# Patient Record
Sex: Male | Born: 1960 | Race: White | Hispanic: No | Marital: Single | Smoking: Current every day smoker
Health system: Southern US, Community
[De-identification: ages and names within clinical notes are randomized; demographics above are authoritative.]

## PROBLEM LIST (undated history)

## (undated) DIAGNOSIS — Z1211 Encounter for screening for malignant neoplasm of colon: Secondary | ICD-10-CM

## (undated) DIAGNOSIS — M5417 Radiculopathy, lumbosacral region: Secondary | ICD-10-CM

## (undated) DIAGNOSIS — I10 Essential (primary) hypertension: Secondary | ICD-10-CM

---

## 2016-04-07 ENCOUNTER — Emergency Department (HOSPITAL_COMMUNITY)
Admission: EM | Admit: 2016-04-07 | Discharge: 2016-04-08 | Disposition: A | Discharge status: Home / Self Care | Payer: BLUE CROSS/BLUE SHIELD | Attending: Emergency Medicine | Admitting: Emergency Medicine

## 2016-04-07 ENCOUNTER — Emergency Department (HOSPITAL_COMMUNITY): Payer: BLUE CROSS/BLUE SHIELD

## 2016-04-07 ENCOUNTER — Encounter (HOSPITAL_COMMUNITY): Payer: Self-pay

## 2016-04-07 DIAGNOSIS — R9431 Abnormal electrocardiogram [ECG] [EKG]: Secondary | ICD-10-CM | POA: Diagnosis not present

## 2016-04-07 DIAGNOSIS — R5383 Other fatigue: Secondary | ICD-10-CM | POA: Insufficient documentation

## 2016-04-07 DIAGNOSIS — R42 Dizziness and giddiness: Secondary | ICD-10-CM | POA: Insufficient documentation

## 2016-04-07 DIAGNOSIS — R079 Chest pain, unspecified: Secondary | ICD-10-CM | POA: Insufficient documentation

## 2016-04-07 DIAGNOSIS — F17210 Nicotine dependence, cigarettes, uncomplicated: Secondary | ICD-10-CM | POA: Diagnosis not present

## 2016-04-07 LAB — CBC WITH DIFFERENTIAL/PLATELET
BASOS PCT: 1 %
Basophils Absolute: 0 10*3/uL (ref 0.0–0.1)
EOS ABS: 0.1 10*3/uL (ref 0.0–0.7)
EOS PCT: 1 %
HCT: 37.9 % — ABNORMAL LOW (ref 39.0–52.0)
Hemoglobin: 12.6 g/dL — ABNORMAL LOW (ref 13.0–17.0)
LYMPHS ABS: 1.5 10*3/uL (ref 0.7–4.0)
Lymphocytes Relative: 18 %
MCH: 29.2 pg (ref 26.0–34.0)
MCHC: 33.2 g/dL (ref 30.0–36.0)
MCV: 87.7 fL (ref 78.0–100.0)
MONOS PCT: 8 %
Monocytes Absolute: 0.7 10*3/uL (ref 0.1–1.0)
Neutro Abs: 6.2 10*3/uL (ref 1.7–7.7)
Neutrophils Relative %: 72 %
PLATELETS: 229 10*3/uL (ref 150–400)
RBC: 4.32 MIL/uL (ref 4.22–5.81)
RDW: 12.9 % (ref 11.5–15.5)
WBC: 8.6 10*3/uL (ref 4.0–10.5)

## 2016-04-07 LAB — I-STAT CHEM 8, ED
BUN: 13 mg/dL (ref 6–20)
CALCIUM ION: 1.03 mmol/L — AB (ref 1.12–1.23)
Chloride: 103 mmol/L (ref 101–111)
Creatinine, Ser: 0.8 mg/dL (ref 0.61–1.24)
Glucose, Bld: 97 mg/dL (ref 65–99)
HEMATOCRIT: 39 % (ref 39.0–52.0)
HEMOGLOBIN: 13.3 g/dL (ref 13.0–17.0)
Potassium: 3.5 mmol/L (ref 3.5–5.1)
SODIUM: 141 mmol/L (ref 135–145)
TCO2: 23 mmol/L (ref 0–100)

## 2016-04-07 LAB — TROPONIN I

## 2016-04-07 NOTE — ED Provider Notes (Addendum)
CSN: [id number]     Arrival date & time 04/07/16  2039 History   First MD Initiated Contact with Patient 04/07/16 2043     Chief Complaint  Patient presents with  . Chest Pain     (Consider location/radiation/quality/duration/timing/severity/associated sxs/prior Treatment) HPI Comments: This is a thin 55 year old male who states at work.  Tonight he developed sharp midsternal chest pain that lasted approximately 45 minutes, he denies nausea, shortness of breath but does state he feels slightly lightheaded.  EMS was called.  He did give him 3 sublingual nitros and 4 baby aspirin.  He does have relief of his discomfort, but still has a small amount on arrival to the emergency department.  EMS stated that he was very hypertensive on their arrival and he was pale, not diaphoretic or short of breath. He states he does not like to go to doctors and he has not been in a number of years.  He's had this kind of pain before but never had it checked out as it never lasted this long.  Usually 10-15 minutes.  It is not associated with nausea, shortness of breath, diaphoresis.  Patient is a 55 y.o. male presenting with chest pain. The history is provided by the patient.  Chest Pain Pain location:  Substernal area Pain quality: sharp   Pain radiates to:  Does not radiate Pain radiates to the back: no   Pain severity:  Severe Onset quality:  Sudden Duration:  45 minutes Timing:  Constant Progression:  Improving Chronicity:  Recurrent Relieved by:  Nitroglycerin Worsened by:  Nothing tried Associated symptoms: fatigue   Associated symptoms: no abdominal pain, no altered mental status, no anorexia, no anxiety, no back pain, no claudication, no cough, no diaphoresis, no fever, no headache, no heartburn, no lower extremity edema, no nausea, no near-syncope, no numbness, no palpitations and no shortness of breath   Risk factors: male sex and smoking   Risk factors: no aortic disease, no coronary artery  disease, no diabetes mellitus, no high cholesterol, no hypertension and not obese     No past medical history on file. No past surgical history on file. No family history on file. Social History  Substance Use Topics  . Smoking status: Current Every Day Smoker -- 0.50 packs/day    Types: Cigarettes  . Smokeless tobacco: Not on file  . Alcohol Use: No    Review of Systems  Constitutional: Positive for fatigue. Negative for fever and diaphoresis.  Respiratory: Negative for cough and shortness of breath.   Cardiovascular: Positive for chest pain. Negative for palpitations, claudication and near-syncope.  Gastrointestinal: Negative for heartburn, nausea, abdominal pain and anorexia.  Musculoskeletal: Negative for back pain.  Neurological: Negative for numbness and headaches.      Allergies  Review of patient's allergies indicates no known allergies.  Home Medications   Prior to Admission medications   Medication Sig Start Date End Date Taking? Authorizing Provider  aspirin-acetaminophen-caffeine (EXCEDRIN MIGRAINE) [phone] MG tablet Take 2 tablets by mouth every 6 (six) hours as needed for headache.   Yes Historical Provider, MD  naproxen sodium (ANAPROX) 220 MG tablet Take 220 mg by mouth 2 (two) times daily as needed (pain).   Yes Historical Provider, MD  RaNITidine HCl (ACID REDUCER PO) Take 1 tablet by mouth daily as needed (acid reflux).   Yes Historical Provider, MD   BP 134/82 mmHg  Pulse 69  Temp(Src) 98.7 F (37.1 C)  Resp 14  SpO2 98% Physical Exam  Constitutional: He appears well-developed and well-nourished.  HENT:  Head: Normocephalic.  Eyes: Pupils are equal, round, and reactive to light.  Neck: Normal range of motion.  Cardiovascular: Normal rate, regular rhythm and normal heart sounds.   Pulmonary/Chest: Effort normal and breath sounds normal. He has no wheezes.  Abdominal: Soft.  Neurological: He is alert.  Skin: Skin is warm.    ED Course   Procedures (including critical care time) Labs Review Labs Reviewed  CBC WITH DIFFERENTIAL/PLATELET - Abnormal; Notable for the following:    Hemoglobin 12.6 (*)    HCT 37.9 (*)    All other components within normal limits  I-STAT CHEM 8, ED - Abnormal; Notable for the following:    Calcium, Ion 1.03 (*)    All other components within normal limits  TROPONIN I  TROPONIN I    Imaging Review Dg Chest 2 View  04/07/2016  CLINICAL DATA:  Chest Pain EXAM: CHEST  2 VIEW COMPARISON:  None. FINDINGS: Pulmonary hyperinflation. Lungs are clear without infiltrate or effusion. Negative for heart failure. Negative for mass or adenopathy. IMPRESSION: Pulmonary hyperinflation.  No acute cardiopulmonary abnormality. Electronically Signed   By: Marlan Palau M.D.   On: 04/07/2016 21:14   I have personally reviewed and evaluated these images and lab results as part of my medical decision-making.   EKG Interpretation   Date/Time:  Monday Apr 07 2016 20:48:57 EDT Ventricular Rate:  75 PR Interval:  150 QRS Duration: 99 QT Interval:  417 QTC Calculation: 466 R Axis:   115 Text Interpretation:  Sinus rhythm Probable left atrial enlargement  Consider right ventricular hypertrophy Nonspecific T abnrm, anterolateral  leads No previous ECGs available Confirmed by [facility] MD, ERIN ([id number])  on 04/07/2016 8:54:09 PM     Patient has had 2 sets of negative cardiac markers, EKG has been unchanged.  Remainder of his labs are normal.  I have discussed at length the importance of follow-up, especially because he was initially hypertensive.  On EMS arrival and with anything to compare to.  This is of concern, especially since his EKG is showing some ventricular hypertrophy MDM   Final diagnoses:  Chest pain, unspecified chest pain type  EKG abnormality         Earley Favor, NP 04/08/16 0145  Alvira Monday, MD 04/09/16 1318  Earley Favor, NP 04/14/16 1914  Alvira Monday, MD 04/20/16  1243

## 2016-04-07 NOTE — ED Notes (Signed)
Patient was at work and began to have sudden sharp chest pain that stays in the mid chest.  Denies any LOC or SOB. 18 gauge places by EMS in the Left AC.  3 nitro given PTA.  Patient also took 4 baby Asprin. Patient A&Ox4

## 2016-04-08 LAB — TROPONIN I: Troponin I: <0.03 ng/mL (ref <0.031)

## 2016-04-08 NOTE — Discharge Instructions (Signed)
Cardiac-Specific Troponin I and T Test WHY AM I HAVING THIS TEST? You may have this test if you have experienced chest pain. The test can be used to determine if you have had a heart attack or injury to heart (cardiac) muscle. This test can also help predict the possibility of future heart attacks. This test measures the concentration of cardiac-specific troponin in your blood. Troponins are proteins that help muscles contract. There are three forms of troponin, including troponins C, I, and T. The types of troponins I and T that are found in cardiac muscle are different from the troponins I and T that are found in skeletal muscle. Therefore, testing can be done for cardiac-specific troponins I and T. These types of troponin are normally present in very small quantities in the blood. When there is damage to heart muscle cells, cardiac troponins I and T are released into circulation. The more damage there is, the greater the concentration of troponins I and T. When a person has a heart attack, levels of troponin can become elevated in the blood within 3-4 hours after injury and may remain elevated for 10-14 days. WHAT KIND OF SAMPLE IS TAKEN? A blood sample is required for this test. It is usually collected by inserting a needle into a vein. Usually, an initial blood sample is collected, and then another blood sample is collected 12 hours later. After these samples, you will have your blood tested daily for 3-5 days. You might also have it tested weekly for 5-6 weeks. HOW DO I PREPARE FOR THE TEST? There is no preparation required for this test. However, be aware that you will need to make arrangements to have your blood collected frequently.  WHAT ARE THE REFERENCE RANGES? Reference values are considered healthy values established after testing a large group of healthy people. Reference values may vary among different people, labs, and hospitals. It is your responsibility to obtain your test results. Ask  the lab or department performing the test when and how you will get your results. Reference values for cardiac troponins are as follows:  Cardiac troponin T: less than 0.1 ng/mL.  Cardiac troponin I: less than 0.03 ng/mL. WHAT DO THE RESULTS MEAN? Troponin values above the reference values may indicate:  Injury to the heart muscle.  Heart attack. Talk with your health care provider to discuss your results, treatment options, and if necessary, the need for more tests. Talk with your health care provider if you have any questions about your results.   This information is not intended to replace advice given to you by your health care provider. Make sure you discuss any questions you have with your health care provider.   Document Released: 11/29/2004 Document Revised: 11/17/2014 Document Reviewed: 03/22/2014 Elsevier Interactive Patient Education 2016 [employer] Tonight your were evaluated for chest pain Your EKG is abnormal with non specific changes  We have no other EKG for comparison You had 2 sets orf cardiac markers 3 hours apart with no change  Which is reassuring BUT will need further evaluation by a cardiologist Please make an appointment

## 2016-04-08 NOTE — ED Notes (Signed)
Patient able to ambulate independently  

## 2016-05-15 ENCOUNTER — Emergency Department (HOSPITAL_COMMUNITY): Payer: BLUE CROSS/BLUE SHIELD

## 2016-05-15 ENCOUNTER — Encounter (HOSPITAL_COMMUNITY): Payer: Self-pay | Admitting: Nurse Practitioner

## 2016-05-15 ENCOUNTER — Emergency Department (HOSPITAL_COMMUNITY)
Admission: EM | Admit: 2016-05-15 | Discharge: 2016-05-15 | Disposition: A | Discharge status: Home / Self Care | Payer: BLUE CROSS/BLUE SHIELD | Attending: Emergency Medicine | Admitting: Emergency Medicine

## 2016-05-15 DIAGNOSIS — Z7982 Long term (current) use of aspirin: Secondary | ICD-10-CM | POA: Insufficient documentation

## 2016-05-15 DIAGNOSIS — F17210 Nicotine dependence, cigarettes, uncomplicated: Secondary | ICD-10-CM | POA: Insufficient documentation

## 2016-05-15 DIAGNOSIS — N132 Hydronephrosis with renal and ureteral calculous obstruction: Secondary | ICD-10-CM | POA: Insufficient documentation

## 2016-05-15 DIAGNOSIS — I10 Essential (primary) hypertension: Secondary | ICD-10-CM | POA: Diagnosis not present

## 2016-05-15 DIAGNOSIS — R1031 Right lower quadrant pain: Secondary | ICD-10-CM

## 2016-05-15 DIAGNOSIS — N23 Unspecified renal colic: Secondary | ICD-10-CM

## 2016-05-15 HISTORY — DX: Essential (primary) hypertension: I10

## 2016-05-15 LAB — COMPREHENSIVE METABOLIC PANEL
ALBUMIN: 4.2 g/dL (ref 3.5–5.0)
ALT: 10 U/L — ABNORMAL LOW (ref 17–63)
ANION GAP: 10 (ref 5–15)
AST: 14 U/L — ABNORMAL LOW (ref 15–41)
Alkaline Phosphatase: 80 U/L (ref 38–126)
BUN: 15 mg/dL (ref 6–20)
CO2: 25 mmol/L (ref 22–32)
Calcium: 9.6 mg/dL (ref 8.9–10.3)
Chloride: 103 mmol/L (ref 101–111)
Creatinine, Ser: 1.32 mg/dL — ABNORMAL HIGH (ref 0.61–1.24)
GFR calc Af Amer: >60 mL/min (ref >60)
GFR calc non Af Amer: >60 mL/min (ref >60)
GLUCOSE: 159 mg/dL — AB (ref 65–99)
POTASSIUM: 3.3 mmol/L — AB (ref 3.5–5.1)
SODIUM: 138 mmol/L (ref 135–145)
TOTAL PROTEIN: 6.9 g/dL (ref 6.5–8.1)
Total Bilirubin: 0.8 mg/dL (ref 0.3–1.2)

## 2016-05-15 LAB — URINALYSIS, ROUTINE W REFLEX MICROSCOPIC
BILIRUBIN URINE: NEGATIVE
Glucose, UA: NEGATIVE mg/dL
Ketones, ur: NEGATIVE mg/dL
Leukocytes, UA: NEGATIVE
NITRITE: NEGATIVE
PH: 7.5 (ref 5.0–8.0)
Protein, ur: NEGATIVE mg/dL
SPECIFIC GRAVITY, URINE: 1.01 (ref 1.005–1.030)

## 2016-05-15 LAB — CBC
HEMATOCRIT: 43.9 % (ref 39.0–52.0)
HEMOGLOBIN: 15 g/dL (ref 13.0–17.0)
MCH: 30.1 pg (ref 26.0–34.0)
MCHC: 34.2 g/dL (ref 30.0–36.0)
MCV: 88 fL (ref 78.0–100.0)
Platelets: 221 10*3/uL (ref 150–400)
RBC: 4.99 MIL/uL (ref 4.22–5.81)
RDW: 13.2 % (ref 11.5–15.5)
WBC: 13.1 10*3/uL — ABNORMAL HIGH (ref 4.0–10.5)

## 2016-05-15 LAB — URINE MICROSCOPIC-ADD ON

## 2016-05-15 MED ORDER — LISINOPRIL 10 MG PO TABS: 10.0000 mg | ORAL_TABLET | Freq: Every day | ORAL | Status: AC

## 2016-05-15 MED ORDER — LISINOPRIL 10 MG PO TABS
10.0000 mg | ORAL_TABLET | Freq: Once | ORAL | Status: AC
  Administered 2016-05-15: 10 mg via ORAL
  Filled 2016-05-15: qty 1

## 2016-05-15 MED ORDER — KETOROLAC TROMETHAMINE 30 MG/ML IJ SOLN
30.0000 mg | Freq: Once | INTRAMUSCULAR | Status: AC
  Administered 2016-05-15: 30 mg via INTRAVENOUS
  Filled 2016-05-15: qty 1

## 2016-05-15 MED ORDER — SODIUM CHLORIDE 0.9 % IV BOLUS (SEPSIS)
1000.0000 mL | Freq: Once | INTRAVENOUS | Status: AC
  Administered 2016-05-15: 1000 mL via INTRAVENOUS

## 2016-05-15 MED ORDER — ONDANSETRON HCL 4 MG/2ML IJ SOLN
4.0000 mg | Freq: Once | INTRAMUSCULAR | Status: AC
  Administered 2016-05-15: 4 mg via INTRAVENOUS
  Filled 2016-05-15: qty 2

## 2016-05-15 MED ORDER — IBUPROFEN 600 MG PO TABS: 600.0000 mg | ORAL_TABLET | Freq: Four times a day (QID) | ORAL | Status: AC | PRN

## 2016-05-15 MED ORDER — MORPHINE SULFATE (PF) 4 MG/ML IV SOLN
4.0000 mg | Freq: Once | INTRAVENOUS | Status: AC
  Administered 2016-05-15: 4 mg via INTRAVENOUS
  Filled 2016-05-15: qty 1

## 2016-05-15 MED ORDER — HYDROCODONE-ACETAMINOPHEN 5-325 MG PO TABS: 1.0000 | ORAL_TABLET | Freq: Four times a day (QID) | ORAL | Status: AC | PRN

## 2016-05-15 MED ORDER — HYDROMORPHONE HCL 1 MG/ML IJ SOLN
1.0000 mg | Freq: Once | INTRAMUSCULAR | Status: AC
  Administered 2016-05-15: 1 mg via INTRAVENOUS
  Filled 2016-05-15: qty 1

## 2016-05-15 MED ORDER — IOPAMIDOL (ISOVUE-300) INJECTION 61%
INTRAVENOUS | Status: AC
  Administered 2016-05-15: 100 mL
  Filled 2016-05-15: qty 100

## 2016-05-15 MED ORDER — TAMSULOSIN HCL 0.4 MG PO CAPS
0.4000 mg | ORAL_CAPSULE | Freq: Every day | ORAL | Status: AC
Start: 2016-05-15 — End: ?

## 2016-05-15 NOTE — Discharge Instructions (Signed)
Take motrin for pain.   Take flomax daily   Take vicodin for severe pain. Do NOT drive with it.   Take lisinopril for elevated blood pressure.   Recheck blood pressure with your doctor or at a pharmacy next week   See urology and primary care doctor for follow up   Return to ER if you have worse abdominal pain, vomiting, fever, unable to urinate

## 2016-05-15 NOTE — ED Provider Notes (Signed)
  Physical Exam  BP 166/98 mmHg  Pulse 78  Temp(Src) 98.5 F (36.9 C) (Oral)  Resp 22  Ht 5\' 9"  (1.753 m)  Wt 115 lb (52.164 kg)  BMI 16.97 kg/m2  SpO2 98%  Physical Exam  ED Course  Procedures  MDM Care assumed at sign out at 4pm. Patient has RLQ pain for 2 days. Has RLQ tenderness. Sign out pending CT ab/pel, concern for possible appendicitis. CT showed no appendicitis, but has R 2x 3 mm R UVJ stone with mild hydro. Pain controlled after pain meds, toradol. UA showed no infection. Cr 1.3, baseline 0.9, likely from kidney stone and dehydration. Was hypertensive 200/100 and has hx of HTN but not on meds. Given lisinopril and BP on discharge is 160/90. Patient has no PCP. Will dc home with motrin, vicodin, flomax, lisinopril. Will have him follow up with [facility] and [facility]rology.   Richardean Canalavid H Kamalei Roeder, MD 05/15/16 [phone]

## 2016-05-15 NOTE — ED Notes (Signed)
He c/o 2 day history of RLQ abd pain radiating across his entire abd. Pain increased with movement. Reports nausea, constipation. Denies n/v, urinary changes, fevers. He has had headaches this week as well, which he took aspirin for with relief at home. He is alert, breathing easily

## 2016-05-15 NOTE — ED Provider Notes (Signed)
CSN: [id number]     Arrival date & time 05/15/16  1150 History   First MD Initiated Contact with Patient 05/15/16 1347     Chief Complaint  Patient presents with  . Abdominal Pain     (Consider location/radiation/quality/duration/timing/severity/associated sxs/prior Treatment) HPI....Marland Kitchen.Marland Kitchen.Right lower quadrant pain for 2 days with associated anorexia. Pain radiates to suprapubic area. Review systems positive for nausea and constipation. No vomiting, fever, sweats, chills. He has a history of hypertension but has not been taking medications. Palpation and positioning make pain worse.  History reviewed. No pertinent past medical history. History reviewed. No pertinent past surgical history. History reviewed. No pertinent family history. Social History  Substance Use Topics  . Smoking status: Current Every Day Smoker -- 0.50 packs/day    Types: Cigarettes  . Smokeless tobacco: None  . Alcohol Use: No    Review of Systems  All other systems reviewed and are negative.     Allergies  Review of patient's allergies indicates no known allergies.  Home Medications   Prior to Admission medications   Medication Sig Start Date End Date Taking? Authorizing Provider  aspirin-acetaminophen-caffeine (EXCEDRIN MIGRAINE) [phone] MG tablet Take 2 tablets by mouth every 6 (six) hours as needed for headache.   Yes Historical Provider, MD  naproxen sodium (ANAPROX) 220 MG tablet Take 220 mg by mouth 2 (two) times daily as needed (pain).   Yes Historical Provider, MD  RaNITidine HCl (ACID REDUCER PO) Take 1 tablet by mouth daily as needed (acid reflux).   Yes Historical Provider, MD   BP 181/102 mmHg  Pulse 101  Temp(Src) 98.5 F (36.9 C) (Oral)  Resp 22  Ht 5\' 9"  (1.753 m)  Wt 115 lb (52.164 kg)  BMI 16.97 kg/m2  SpO2 99% Physical Exam  Constitutional: He is oriented to person, place, and time. He appears well-developed and well-nourished.  HENT:  Head: Normocephalic and atraumatic.   Eyes: Conjunctivae and EOM are normal. Pupils are equal, round, and reactive to light.  Neck: Normal range of motion. Neck supple.  Cardiovascular: Normal rate and regular rhythm.   Pulmonary/Chest: Effort normal and breath sounds normal.  Abdominal: Soft. Bowel sounds are normal.  Tender right lower quadrant  Musculoskeletal: Normal range of motion.  Neurological: He is alert and oriented to person, place, and time.  Skin: Skin is warm and dry.  Psychiatric: He has a normal mood and affect. His behavior is normal.  Nursing note and vitals reviewed.   ED Course  Procedures (including critical care time) Labs Review Labs Reviewed  COMPREHENSIVE METABOLIC PANEL - Abnormal; Notable for the following:    Potassium 3.3 (*)    Glucose, Bld 159 (*)    Creatinine, Ser 1.32 (*)    AST 14 (*)    ALT 10 (*)    All other components within normal limits  CBC - Abnormal; Notable for the following:    WBC 13.1 (*)    All other components within normal limits  URINALYSIS, ROUTINE W REFLEX MICROSCOPIC (NOT AT [facility])    Imaging Review No results found. I have personally reviewed and evaluated these images and lab results as part of my medical decision-making.   EKG Interpretation None      MDM   Final diagnoses:  RLQ abdominal pain    History and physical worrisome for appendicitis. White count elevated at 13 K. CT abdomen pelvis pending. Discussed with Dr. Tresa EndoYao    Shane Bucklew, MD 05/15/16 [phone]

## 2017-06-05 IMAGING — DX DG CHEST 2V
2 series · 2 of 2 positions shown · non-contrast
Comparison: None.

CLINICAL DATA: Chest Pain

EXAM:
CHEST  2 VIEW

[chest lat]
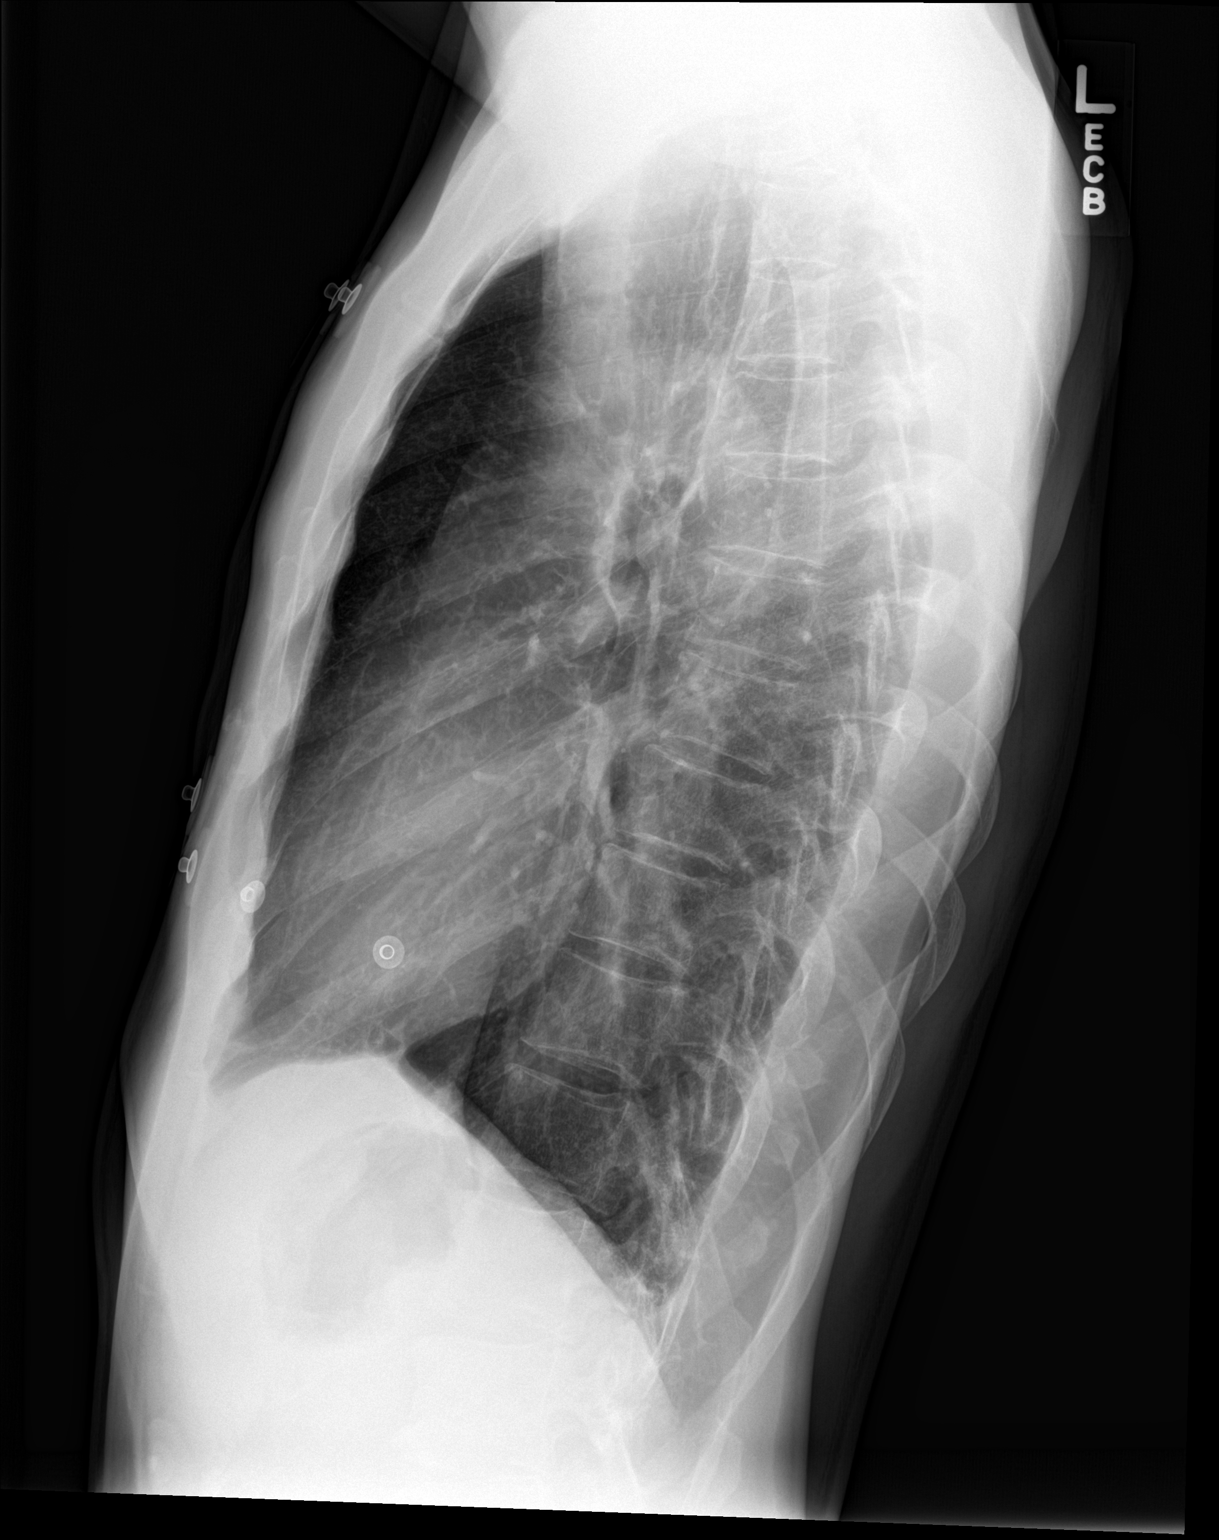

[chest pa]
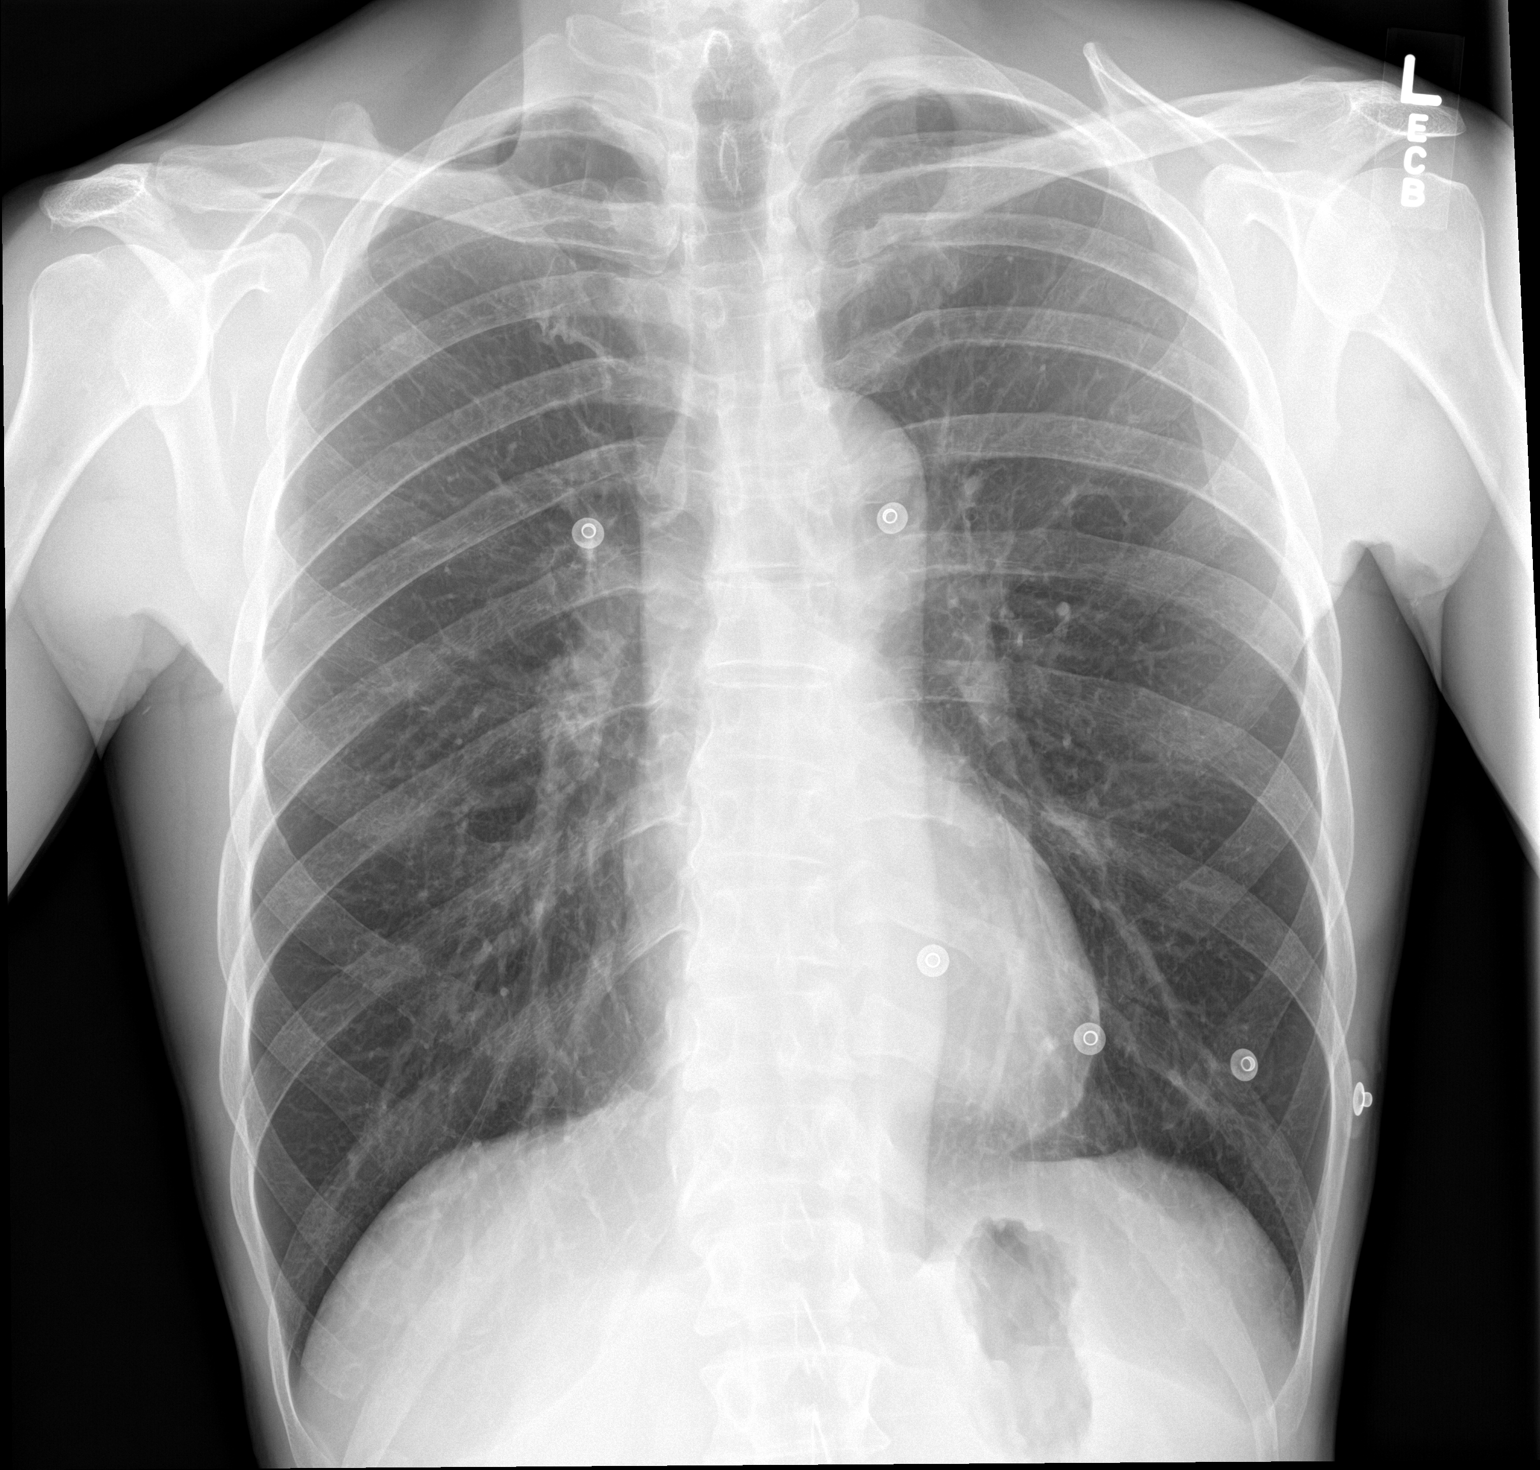

[2 of 2 positions shown; findings below may reference images not displayed]

FINDINGS: Pulmonary hyperinflation. Lungs are clear without infiltrate or
effusion. Negative for heart failure. Negative for mass or
adenopathy.
IMPRESSION: Pulmonary hyperinflation.  No acute cardiopulmonary abnormality.

## 2021-01-24 ENCOUNTER — Emergency Department (HOSPITAL_COMMUNITY): Payer: BLUE CROSS/BLUE SHIELD

## 2021-01-24 ENCOUNTER — Encounter (HOSPITAL_COMMUNITY): Payer: Self-pay | Admitting: Emergency Medicine

## 2021-01-24 ENCOUNTER — Telehealth (HOSPITAL_COMMUNITY): Payer: Self-pay | Admitting: Emergency Medicine

## 2021-01-24 ENCOUNTER — Emergency Department (HOSPITAL_COMMUNITY)
Admission: EM | Admit: 2021-01-24 | Discharge: 2021-01-24 | Disposition: A | Discharge status: Home / Self Care | Payer: BLUE CROSS/BLUE SHIELD | Attending: Emergency Medicine | Admitting: Emergency Medicine

## 2021-01-24 ENCOUNTER — Telehealth: Payer: Self-pay

## 2021-01-24 DIAGNOSIS — I10 Essential (primary) hypertension: Secondary | ICD-10-CM | POA: Diagnosis not present

## 2021-01-24 DIAGNOSIS — F17210 Nicotine dependence, cigarettes, uncomplicated: Secondary | ICD-10-CM | POA: Insufficient documentation

## 2021-01-24 DIAGNOSIS — Z79899 Other long term (current) drug therapy: Secondary | ICD-10-CM | POA: Diagnosis not present

## 2021-01-24 DIAGNOSIS — Z7982 Long term (current) use of aspirin: Secondary | ICD-10-CM | POA: Insufficient documentation

## 2021-01-24 DIAGNOSIS — R1032 Left lower quadrant pain: Secondary | ICD-10-CM

## 2021-01-24 DIAGNOSIS — Z87442 Personal history of urinary calculi: Secondary | ICD-10-CM | POA: Insufficient documentation

## 2021-01-24 DIAGNOSIS — R1010 Upper abdominal pain, unspecified: Secondary | ICD-10-CM | POA: Diagnosis not present

## 2021-01-24 LAB — COMPREHENSIVE METABOLIC PANEL
ALT: 20 U/L (ref 0–44)
AST: 19 U/L (ref 15–41)
Albumin: 4.5 g/dL (ref 3.5–5.0)
Alkaline Phosphatase: 76 U/L (ref 38–126)
Anion gap: 10 (ref 5–15)
BUN: 23 mg/dL — ABNORMAL HIGH (ref 6–20)
CO2: 28 mmol/L (ref 22–32)
Calcium: 9.5 mg/dL (ref 8.9–10.3)
Chloride: 102 mmol/L (ref 98–111)
Creatinine, Ser: 0.79 mg/dL (ref 0.61–1.24)
GFR, Estimated: >60 mL/min (ref >60)
Glucose, Bld: 134 mg/dL — ABNORMAL HIGH (ref 70–99)
Potassium: 3.9 mmol/L (ref 3.5–5.1)
Sodium: 140 mmol/L (ref 135–145)
Total Bilirubin: 0.7 mg/dL (ref 0.3–1.2)
Total Protein: 7.3 g/dL (ref 6.5–8.1)

## 2021-01-24 LAB — URINALYSIS, ROUTINE W REFLEX MICROSCOPIC
Bacteria, UA: NONE SEEN
Bilirubin Urine: NEGATIVE
Glucose, UA: NEGATIVE mg/dL
Ketones, ur: NEGATIVE mg/dL
Leukocytes,Ua: NEGATIVE
Nitrite: NEGATIVE
Protein, ur: NEGATIVE mg/dL
Specific Gravity, Urine: 1.019 (ref 1.005–1.030)
pH: 6 (ref 5.0–8.0)

## 2021-01-24 LAB — LIPASE, BLOOD: Lipase: 26 U/L (ref 11–51)

## 2021-01-24 LAB — CBC WITH DIFFERENTIAL/PLATELET
Abs Immature Granulocytes: 0.03 10*3/uL (ref 0.00–0.07)
Basophils Absolute: 0.1 10*3/uL (ref 0.0–0.1)
Basophils Relative: 1 %
Eosinophils Absolute: 0.1 10*3/uL (ref 0.0–0.5)
Eosinophils Relative: 1 %
HCT: 45.7 % (ref 39.0–52.0)
Hemoglobin: 15.5 g/dL (ref 13.0–17.0)
Immature Granulocytes: 0 %
Lymphocytes Relative: 13 %
Lymphs Abs: 1.3 10*3/uL (ref 0.7–4.0)
MCH: 30.5 pg (ref 26.0–34.0)
MCHC: 33.9 g/dL (ref 30.0–36.0)
MCV: 90 fL (ref 80.0–100.0)
Monocytes Absolute: 0.6 10*3/uL (ref 0.1–1.0)
Monocytes Relative: 6 %
Neutro Abs: 7.9 10*3/uL — ABNORMAL HIGH (ref 1.7–7.7)
Neutrophils Relative %: 79 %
Platelets: 259 10*3/uL (ref 150–400)
RBC: 5.08 MIL/uL (ref 4.22–5.81)
RDW: 12.8 % (ref 11.5–15.5)
WBC: 10 10*3/uL (ref 4.0–10.5)
nRBC: 0 % (ref 0.0–0.2)

## 2021-01-24 MED ORDER — SODIUM CHLORIDE 0.9 % IV BOLUS
500.0000 mL | Freq: Once | INTRAVENOUS | Status: AC
  Administered 2021-01-24: 500 mL via INTRAVENOUS

## 2021-01-24 MED ORDER — OXYCODONE HCL 5 MG PO TABS: 2.5000 mg | ORAL_TABLET | Freq: Four times a day (QID) | ORAL | 0 refills | Status: AC | PRN

## 2021-01-24 MED ORDER — HYDROCODONE-ACETAMINOPHEN 5-325 MG PO TABS: 1.0000 | ORAL_TABLET | Freq: Four times a day (QID) | ORAL | 0 refills | Status: AC | PRN

## 2021-01-24 MED ORDER — IOHEXOL 350 MG/ML SOLN
100.0000 mL | Freq: Once | INTRAVENOUS | Status: AC | PRN
  Administered 2021-01-24: 100 mL via INTRAVENOUS

## 2021-01-24 MED ORDER — KETOROLAC TROMETHAMINE 15 MG/ML IJ SOLN
15.0000 mg | Freq: Once | INTRAMUSCULAR | Status: AC
  Administered 2021-01-24: 15 mg via INTRAVENOUS
  Filled 2021-01-24: qty 1

## 2021-01-24 NOTE — Telephone Encounter (Signed)
Sending prescription to new pharmacy.  Norco out of stock.  Will prescribe percocet to [facility].

## 2021-01-24 NOTE — ED Notes (Signed)
Pt given urinal and made aware of need for urine sample. 

## 2021-01-24 NOTE — Telephone Encounter (Deleted)
I ws Called by Nurse Basilia Jumbo about prescription issue. Patient rx [facility] to wrong pharmacy. I will order oxycodone to his referred pharmacy which is [facility] due to the fact that there is a Customer service manager and and limited supply at [facility].

## 2021-01-24 NOTE — Telephone Encounter (Signed)
Switching pharmacy and med due to nat'l shortage

## 2021-01-24 NOTE — Telephone Encounter (Signed)
Patient called and spoke to the [facility] Agent Victorino Dike about his medication being sent to the wrong pharmacy, the one's he was prescribed in the ED today. I advised the agent to let him know I will have to call the ED to speak to the Charge nurse about this and will call him back to let him know the resolution. I called [facility] ED and spoke to [place], Forensic scientist and she gave the phone to [place], [place] to assist. I advised Cammy Copa what the patient said above, she says it's probably because there is a Customer service manager, so she will prescribe him something different and send to [facility] as requested by patient. I called the patient and advised of the above by Cammy Copa, PA, patient verbalized understanding and says he's at [facility] waiting.

## 2021-01-24 NOTE — ED Notes (Signed)
Pt transported to CT ?

## 2021-01-24 NOTE — ED Triage Notes (Signed)
Per pt, states left groin pain for about a week-states he has no medical history-increased work of breathing although patient is not complaining of SOB

## 2021-01-24 NOTE — Discharge Instructions (Signed)
Please return for any problem.  °

## 2021-01-24 NOTE — ED Provider Notes (Signed)
[facility]-EMERGENCY DEPT Provider Note   CSN: [id number] Arrival date & time: 01/24/21  1050     History Chief Complaint  Patient presents with  . Groin Pain    Shane Edwards is a 60 y.o. male.  60 year old male with prior medical history as detailed below presents for evaluation of patient planes of left upper groin pain.  Patient's pain is worse with movement or with standing.  Climbing stairs is particularly difficult.  He also reports some vague left flank pain approximately 2 days ago.  This pain is improved.  He denies fever.  He denies urinary symptoms.  He reports prior history of renal colic.  Symptoms today are not consistent with renal colic.  The history is provided by the patient and medical records.  Flank Pain This is a new problem. The current episode started more than 2 days ago. The problem occurs constantly. The problem has not changed since onset.Pertinent negatives include no chest pain and no abdominal pain. Nothing aggravates the symptoms. Nothing relieves the symptoms.       Past Medical History:  Diagnosis Date  . Hypertension     There are no problems to display for this patient.   History reviewed. No pertinent surgical history.     No family history on file.  Social History   Tobacco Use  . Smoking status: Current Every Day Smoker    Packs/day: 0.50    Types: Cigarettes  Substance Use Topics  . Alcohol use: No  . Drug use: No    Home Medications Prior to Admission medications   Medication Sig Start Date End Date Taking? Authorizing Provider  aspirin-acetaminophen-caffeine (EXCEDRIN MIGRAINE) [phone] MG tablet Take 2 tablets by mouth every 6 (six) hours as needed for headache.   Yes [provider]  naproxen sodium (ALEVE) 220 MG tablet Take 220 mg by mouth daily as needed (pain).   Yes [provider]  Nutritional Supplements (EQUATE PLUS PO) Take 1 tablet by mouth daily.   Yes [provider]  HYDROcodone-acetaminophen (NORCO/VICODIN) 5-325 MG tablet Take 1 tablet by mouth every 6 (six) hours as needed. Patient not taking: No sig reported 05/15/16   Charlynne Pander, MD  ibuprofen (ADVIL,MOTRIN) 600 MG tablet Take 1 tablet (600 mg total) by mouth every 6 (six) hours as needed. Patient not taking: No sig reported 05/15/16   Charlynne Pander, MD  lisinopril (PRINIVIL,ZESTRIL) 10 MG tablet Take 1 tablet (10 mg total) by mouth daily. Patient not taking: No sig reported 05/15/16   Charlynne Pander, MD  tamsulosin (FLOMAX) 0.4 MG CAPS capsule Take 1 capsule (0.4 mg total) by mouth daily. Patient not taking: No sig reported 05/15/16   Charlynne Pander, MD    Allergies    Patient has no known allergies.  Review of Systems   Review of Systems  Cardiovascular: Negative for chest pain.  Gastrointestinal: Negative for abdominal pain.  Genitourinary: Positive for flank pain.  All other systems reviewed and are negative.   Physical Exam Updated Vital Signs BP (!) 179/107   Pulse 83   Temp 97.7 F (36.5 C) (Oral)   Resp 18   SpO2 98%   Physical Exam Vitals and nursing note reviewed.  Constitutional:      General: He is not in acute distress.    Appearance: He is well-developed.  HENT:     Head: Normocephalic and atraumatic.  Eyes:     Conjunctiva/sclera: Conjunctivae normal.  Pupils: Pupils are equal, round, and reactive to light.  Cardiovascular:     Rate and Rhythm: Normal rate and regular rhythm.     Heart sounds: Normal heart sounds.  Pulmonary:     Effort: Pulmonary effort is normal. No respiratory distress.     Breath sounds: Normal breath sounds.  Abdominal:     General: There is no distension.     Palpations: Abdomen is soft.     Tenderness: There is no abdominal tenderness.  Musculoskeletal:        General: No deformity. Normal range of motion.     Cervical back: Normal range of motion and neck supple.     Comments: Mild tenderness to the  medial proximal left groin - no overlying erythema, edema, no mass   Skin:    General: Skin is warm and dry.  Neurological:     Mental Status: He is alert and oriented to person, place, and time.     ED Results / Procedures / Treatments   Labs (all labs ordered are listed, but only abnormal results are displayed) Labs Reviewed  URINALYSIS, ROUTINE W REFLEX MICROSCOPIC - Abnormal; Notable for the following components:      Result Value   Hgb urine dipstick SMALL (*)    All other components within normal limits  COMPREHENSIVE METABOLIC PANEL - Abnormal; Notable for the following components:   Glucose, Bld 134 (*)    BUN 23 (*)    All other components within normal limits  CBC WITH DIFFERENTIAL/PLATELET - Abnormal; Notable for the following components:   Neutro Abs 7.9 (*)    All other components within normal limits  LIPASE, BLOOD    EKG EKG Interpretation  Date/Time:  Thursday January 24 2021 12:56:32 EDT Ventricular Rate:  85 PR Interval:    QRS Duration: 103 QT Interval:  409 QTC Calculation: 487 R Axis:   146 Text Interpretation: Sinus rhythm LAE, consider biatrial enlargement Nonspecific T abnrm, anterolateral leads Confirmed by Kristine RoyalMessick, Demarius Archila [id number]) on 01/24/2021 1:04:51 PM   Radiology CT Renal Stone Study  Result Date: 01/24/2021 CLINICAL DATA:  Left groin pain for the past week. History of kidney stones. EXAM: CT ABDOMEN AND PELVIS WITHOUT CONTRAST TECHNIQUE: Multidetector CT imaging of the abdomen and pelvis was performed following the standard protocol without IV contrast. COMPARISON:  CT abdomen pelvis dated May 15, 2016. FINDINGS: Lower chest: No acute abnormality. 3.5 mm pulmonary nodule in the right lower lobe, unchanged since 2017, benign. Hepatobiliary: No focal liver abnormality is seen. No gallstones, gallbladder wall thickening, or biliary dilatation. Pancreas: Unremarkable. No pancreatic ductal dilatation or surrounding inflammatory changes. Spleen: Normal in  size without focal abnormality. Adrenals/Urinary Tract: Adrenal glands are unremarkable. Punctate bilateral renal calculi. No hydronephrosis. The bladder is unremarkable. Stomach/Bowel: Stomach is within normal limits. Appendix appears normal. No evidence of bowel wall thickening, distention, or inflammatory changes. Vascular/Lymphatic: Aortic atherosclerosis. No enlarged abdominal or pelvic lymph nodes. Reproductive: Prostate is unremarkable. Other: No abdominal wall hernia or abnormality. No abdominopelvic ascites. No pneumoperitoneum. Musculoskeletal: No acute or significant osseous findings. IMPRESSION: 1. No acute intra-abdominal process. 2. Punctate bilateral nephrolithiasis. 3. Aortic Atherosclerosis (ICD10-I70.0). Electronically Signed   By: Obie DredgeWilliam T Derry M.D.   On: 01/24/2021 12:17   CT Angio Chest/Abd/Pel for Dissection W and/or W/WO  Result Date: 01/24/2021 CLINICAL DATA:  60 year old male with a history of abdominal pain EXAM: CT ANGIOGRAPHY CHEST, ABDOMEN AND PELVIS TECHNIQUE: Multidetector CT imaging through the chest, abdomen and pelvis was performed using  the standard protocol during bolus administration of intravenous contrast. Multiplanar reconstructed images and MIPs were obtained and reviewed to evaluate the vascular anatomy. CONTRAST:  OMNIPAQUE IOHEXOL 350 MG/ML SOLN COMPARISON:  Same-day CT 01/24/2021, prior CT 05/15/2016 FINDINGS: CTA CHEST FINDINGS Cardiovascular: Heart: No cardiomegaly. No pericardial fluid/thickening. No significant coronary calcifications. Aorta: Unremarkable course, caliber, contour of the thoracic aorta. No aneurysm or dissection flap. No periaortic fluid. Pulmonary arteries: Timing of the contrast bolus is not optimized for evaluation of pulmonary artery filling defects. Mediastinum/Nodes: No mediastinal adenopathy. Unremarkable appearance of the thoracic esophagus. Unremarkable appearance of the thoracic inlet. Lungs/Pleura: Paraseptal emphysema at the  right greater than left lung apex. No pneumothorax or pleural effusion. No confluent airspace disease. CTA ABDOMEN AND PELVIS FINDINGS VASCULAR Aorta: Unremarkable course, caliber, contour of the abdominal aorta. No dissection, aneurysm, or periaortic fluid. Mild atherosclerosis of the infrarenal abdominal aorta. Celiac: Patent, with no significant atherosclerotic changes. SMA: Patent, with no significant atherosclerotic changes. Renals: - Right: Right renal artery patent. - Left: Left renal artery patent. IMA: IMA is occluded at the origin, presumably secondary to soft plaque. Left colic artery filling via collateral flow. Right lower extremity: Unremarkable course, caliber, and contour of the right iliac system. No aneurysm, dissection, or occlusion. Mild atherosclerotic changes of the right iliac system. Hypogastric artery is patent, with partially calcified and partially soft plaque of the hypogastric artery. Pelvic arteries are patent. Common femoral artery patent, with mild atherosclerotic changes. Proximal SFA and profunda femoris patent. Left lower extremity: Unremarkable course, caliber, and contour of the left iliac system. No aneurysm, dissection, or occlusion. Atherosclerotic changes of the left iliac system, including mixed soft and calcified plaque at the origin of the left hypogastric artery resulting in 50% narrowing. Pelvic arteries are patent. Hypogastric artery is patent. Common femoral artery patent, with mild atherosclerosis. Proximal SFA and profunda femoris patent. Veins: Unremarkable appearance of the venous system. Review of the MIP images confirms the above findings. NON-VASCULAR Lower chest: No acute. Hepatobiliary: Unremarkable appearance of the liver. Unremarkable gall bladder. Pancreas: Unremarkable. Spleen: Unremarkable. Adrenals/Urinary Tract: - Right adrenal gland: Unremarkable - Left adrenal gland: Unremarkable. - Right kidney: No hydronephrosis, inflammation, or ureteral dilation.  Punctate nephrolithiasis better seen on noncontrast CT. - Left Kidney: No hydronephrosis, inflammation, or ureteral dilation. Punctate nephrolithiasis better seen on noncontrast CT. - Urinary Bladder: Unremarkable. Stomach/Bowel: - Stomach: Unremarkable. - Small bowel: Unremarkable - Appendix: Appendix is not visualized, however, no inflammatory changes are present adjacent to the cecum to indicate an appendicitis. - Colon: Mild to moderate stool burden, otherwise unremarkable colon. Lymphatic: No adenopathy. Mesenteric: No free fluid or air. No mesenteric adenopathy. Reproductive: Unremarkable appearance of the pelvic organs. Other: No hernia. Musculoskeletal: Mild degenerative changes of the lower lumbar spine, particularly L4-L5 and L5-S1. No bony canal narrowing. No acute displaced fracture. IMPRESSION: Negative for acute aortic syndrome. No acute arterial abnormality identified. Developing atherosclerosis of the infrarenal abdominal aorta, bilateral iliac arteries, and the left greater than the right common femoral arteries, as above. Aortic Atherosclerosis (ICD10-I70.0). 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 SMA and celiac artery are patent without stenosis. Emphysema (ICD10-J43.9). Signed, Yvone Neu. Reyne Dumas, RPVI [facility] Radiology Electronically Signed   By: Gilmer Mor D.O.   On: 01/24/2021 13:58    Procedures Procedures   Medications Ordered in ED Medications  sodium chloride 0.9 % bolus 500 mL (500 mLs Intravenous New Bag/Given (Non-Interop) 01/24/21 1153)  ketorolac (TORADOL) 15 MG/ML injection 15 mg (15 mg Intravenous Given 01/24/21 1153)  iohexol (OMNIPAQUE) 350 MG/ML injection 100 mL (100 mLs Intravenous Contrast Given 01/24/21 1312)    ED Course  I have reviewed the triage vital signs and the nursing notes.  Pertinent labs & imaging results that were available during my care of the  patient were reviewed by me and considered in my medical decision making (see chart for details).    MDM Rules/Calculators/A&P                          MDM  Screen complete  ELKIN BELFIELD was evaluated in Emergency Department on 01/24/2021 for the symptoms described in the history of present illness. He was evaluated in the context of the global COVID-19 pandemic, which necessitated consideration that the patient might be at risk for infection with the SARS-CoV-2 virus that causes COVID-19. Institutional protocols and algorithms that pertain to the evaluation of patients at risk for COVID-19 are in a state of rapid change based on information released by regulatory bodies including the CDC and federal and state organizations. These policies and algorithms were followed during the patient's care in the ED.  Patient is presenting for evaluation of reported left groin pain.  Patient's describe symptoms are consistent with likely muscular strain.  However given patient's reported history of left flank pain and hypertension will obtain additional work-up.  CT imaging does not show evidence of significant acute pathology.  CTA abdomen did demonstrate developing evidence of atherosclerosis. Patient understands need for close FU in outpatient setting.   Patient feels improved after ED evaluation.  He understands need for close follow-up. Strict return precautions given and understood.   Final Clinical Impression(s) / ED Diagnoses Final diagnoses:  Left inguinal pain    Rx / DC Orders ED Discharge Orders         Ordered    HYDROcodone-acetaminophen (NORCO/VICODIN) 5-325 MG tablet  Every 6 hours PRN        01/24/21 1539           Wynetta Fines, MD 01/24/21 1550

## 2021-01-25 ENCOUNTER — Telehealth: Payer: Self-pay | Admitting: *Deleted

## 2021-01-25 NOTE — Telephone Encounter (Signed)
TOC CM received call from pt stating [facility] does not have hydrocodone 5 mg/325 mg in stock. Requesting Rx be sent to [facility] on [address]. Message sent to ED provider. Provided pt with information on PCP and to call an schedule appt. Provided info on [facility] on [address] or [employer]. Isidoro Donning RN CCM, [facility] ED TOC CM [phone]

## 2021-10-02 ENCOUNTER — Other Ambulatory Visit: Payer: Self-pay | Admitting: Endocrinology

## 2021-10-02 DIAGNOSIS — G8929 Other chronic pain: Secondary | ICD-10-CM

## 2021-10-02 DIAGNOSIS — G5792 Unspecified mononeuropathy of left lower limb: Secondary | ICD-10-CM

## 2021-10-02 DIAGNOSIS — R1032 Left lower quadrant pain: Secondary | ICD-10-CM

## 2022-03-24 IMAGING — CT CT RENAL STONE PROTOCOL
2 of 4 series · 16 of 46 positions shown, 18 images · non-contrast
Comparison: CT abdomen pelvis dated May 15, 2016.

CLINICAL DATA: Left groin pain for the past week. History of kidney
stones.

EXAM:
CT ABDOMEN AND PELVIS WITHOUT CONTRAST
TECHNIQUE: Multidetector CT imaging of the abdomen and pelvis was performed
following the standard protocol without IV contrast.

[Series 2: axial st · axial · 0.66mm/px · z∈[-552,-182]mm · 13 of 84 slices shown, 15 images]
[im 5/84  soft-tissue]
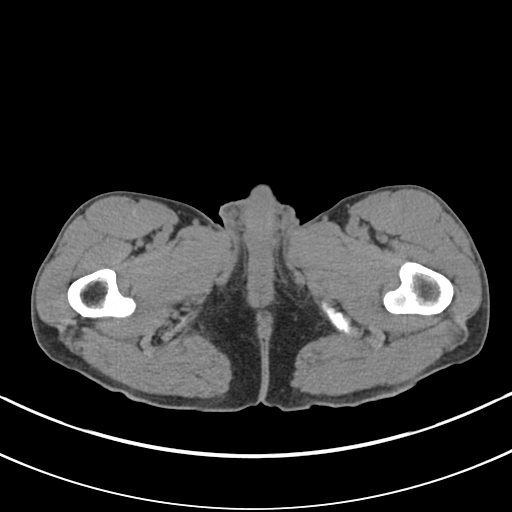
[im 5/84  bone]
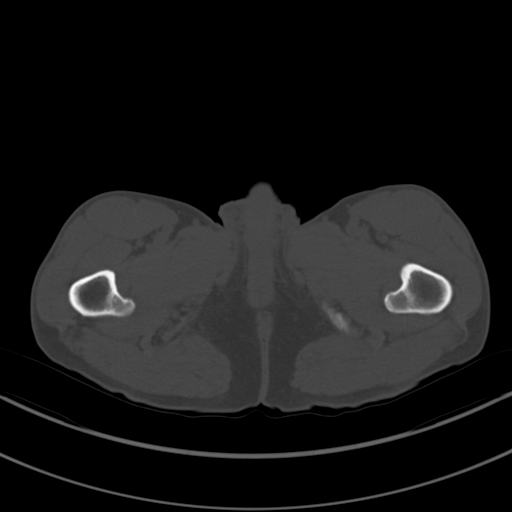
[im 10/84  soft-tissue]
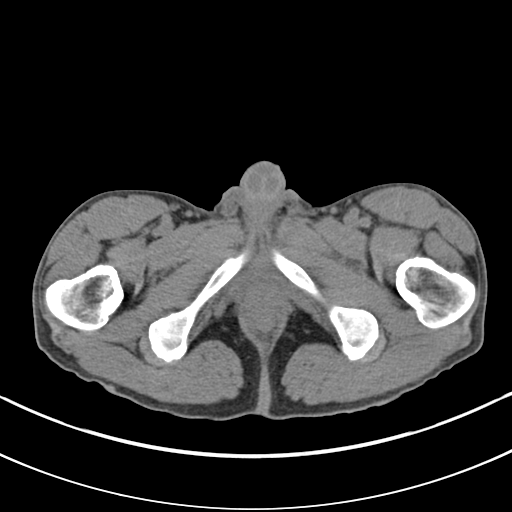
[im 20/84  soft-tissue]
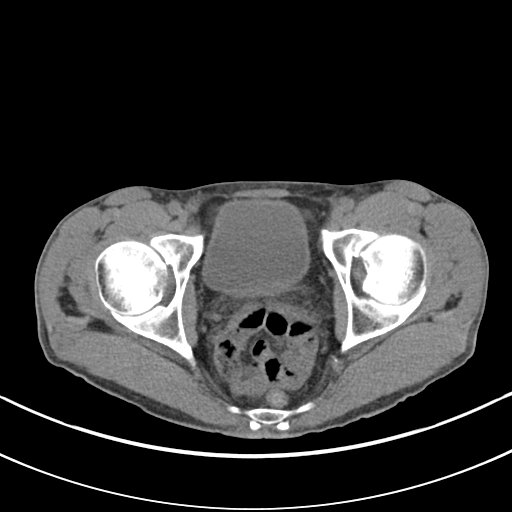
[im 25/84  soft-tissue]
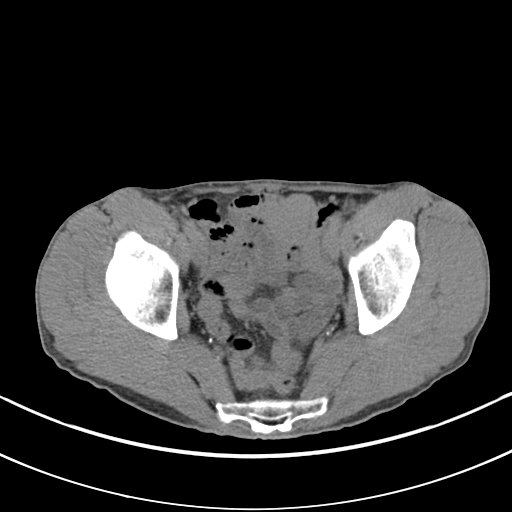
[im 30/84  soft-tissue]
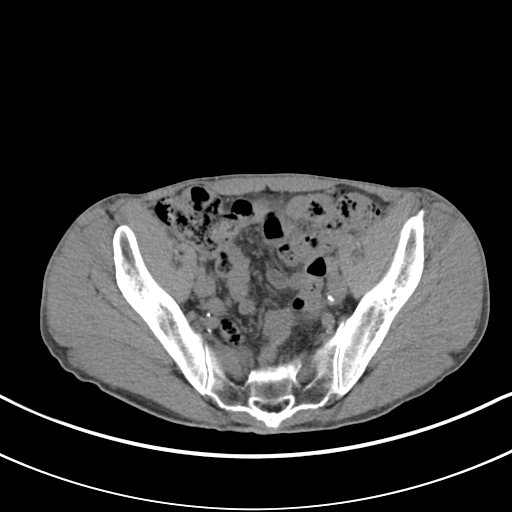
[im 35/84  soft-tissue]
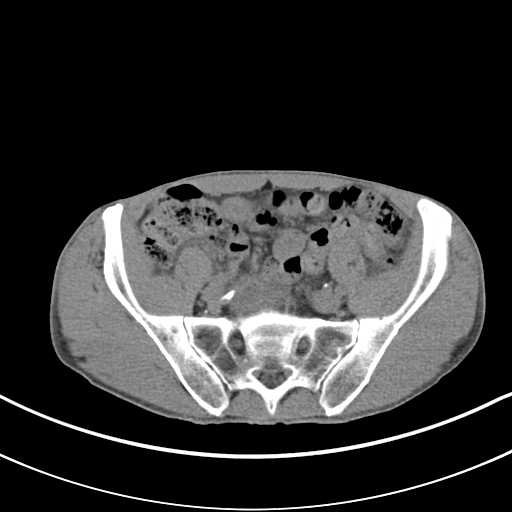
[im 44/84  soft-tissue]
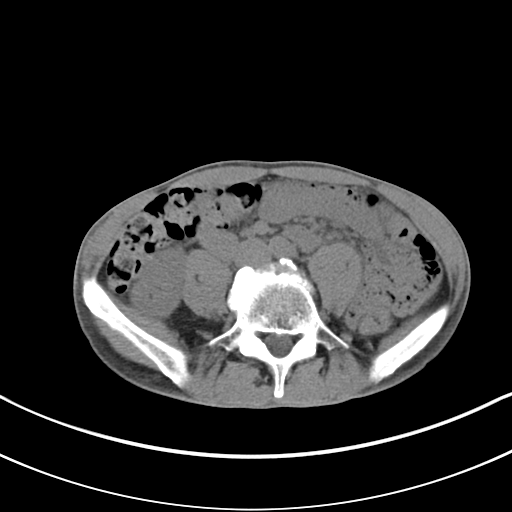
[im 49/84  soft-tissue]
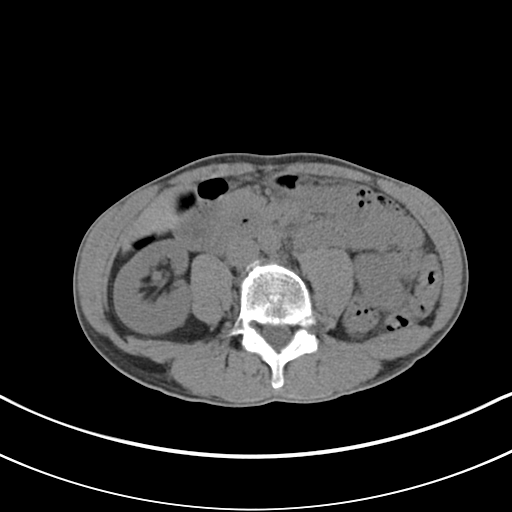
[im 54/84  soft-tissue]
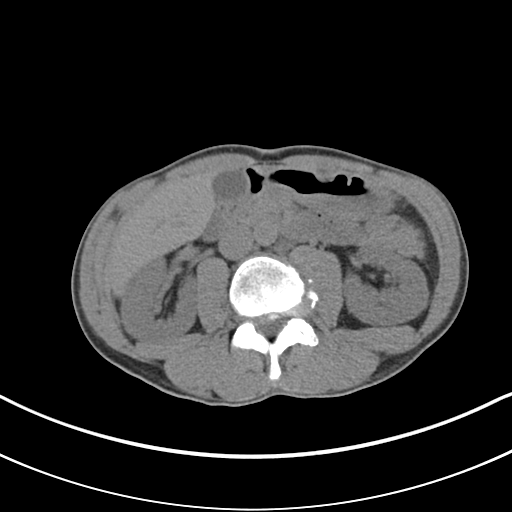
[im 54/84  bone]
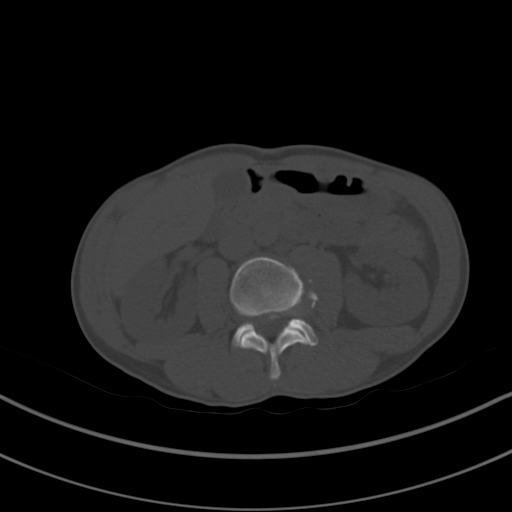
[im 59/84  soft-tissue]
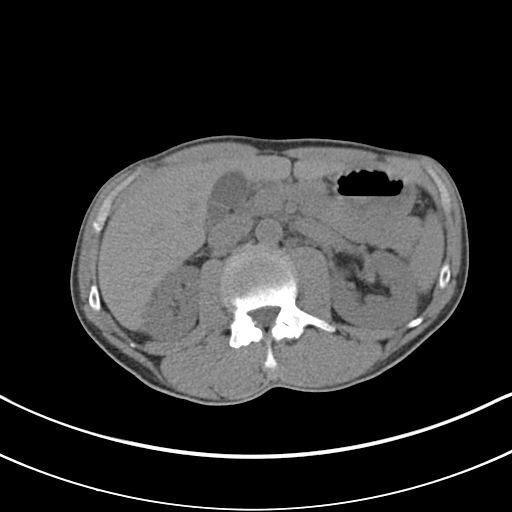
[im 64/84  soft-tissue]
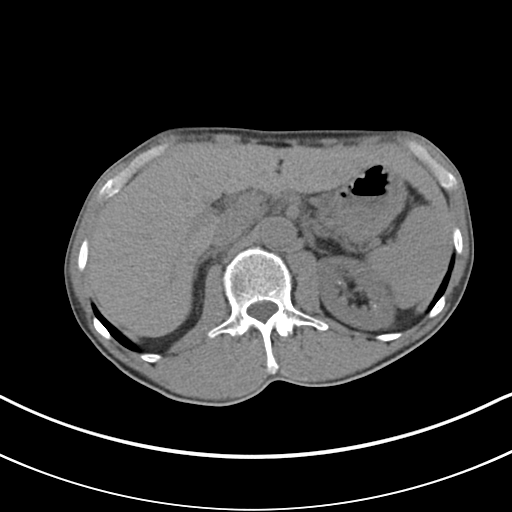
[im 74/84  soft-tissue]
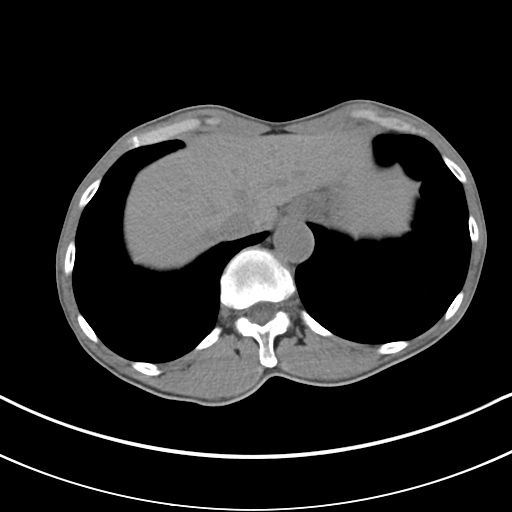
[im 79/84  soft-tissue]
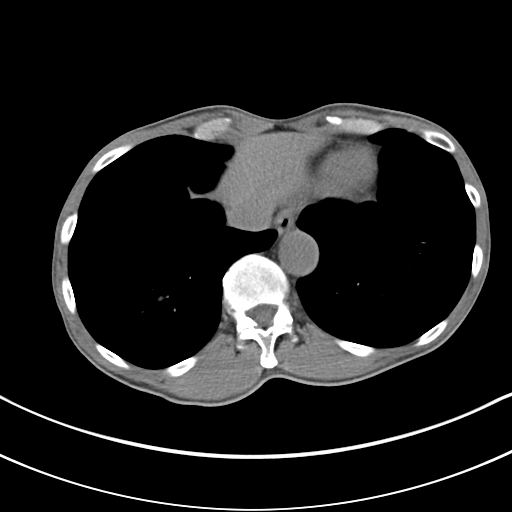

[Series 5: coronal · coronal · 0.62mm/px · 3 of 112 slices shown]
[im 38/112  soft-tissue]
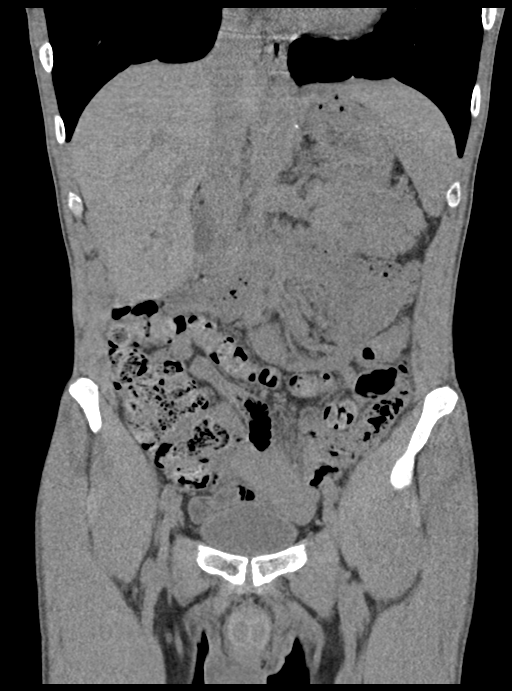
[im 50/112  soft-tissue]
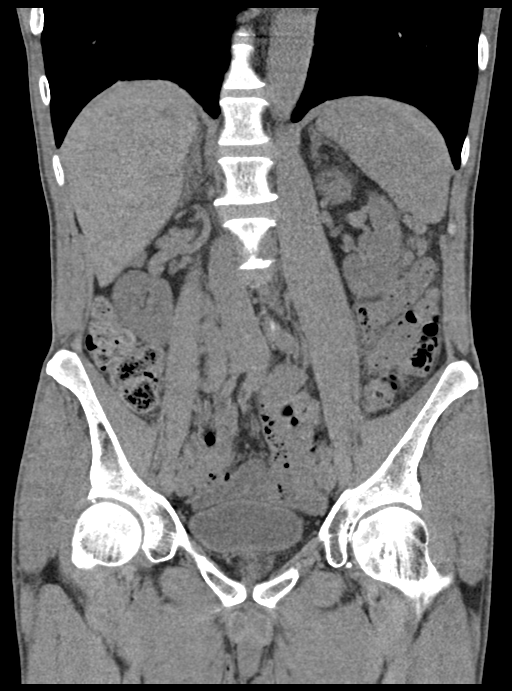
[im 62/112  soft-tissue]
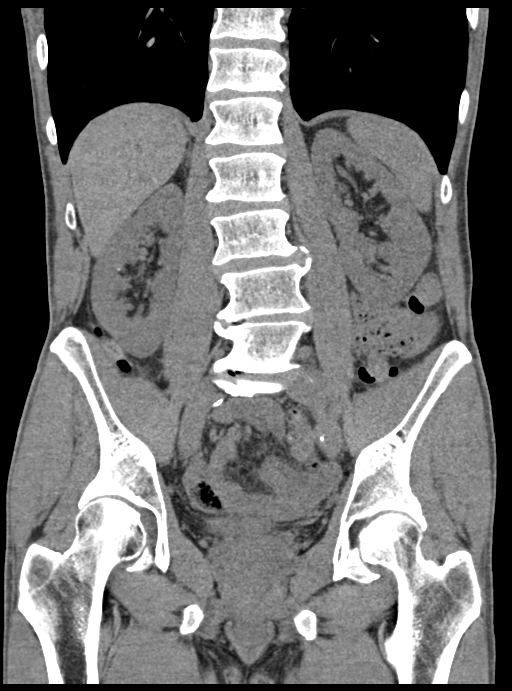

[16 of 46 positions shown; findings below may reference images not displayed]

FINDINGS: Lower chest: No acute abnormality. 3.5 mm pulmonary nodule in the
right lower lobe, unchanged since 1106, benign.

Hepatobiliary: No focal liver abnormality is seen. No gallstones,
gallbladder wall thickening, or biliary dilatation.

Pancreas: Unremarkable. No pancreatic ductal dilatation or
surrounding inflammatory changes.

Spleen: Normal in size without focal abnormality.

Adrenals/Urinary Tract: Adrenal glands are unremarkable. Punctate
bilateral renal calculi. No hydronephrosis. The bladder is
unremarkable.

Stomach/Bowel: Stomach is within normal limits. Appendix appears
normal. No evidence of bowel wall thickening, distention, or
inflammatory changes.

Vascular/Lymphatic: Aortic atherosclerosis. No enlarged abdominal or
pelvic lymph nodes.

Reproductive: Prostate is unremarkable.

Other: No abdominal wall hernia or abnormality. No abdominopelvic
ascites. No pneumoperitoneum.

Musculoskeletal: No acute or significant osseous findings.
IMPRESSION: 1. No acute intra-abdominal process.
2. Punctate bilateral nephrolithiasis.
3. Aortic Atherosclerosis (KMNLP-M03.3).

## 2023-03-04 ENCOUNTER — Ambulatory Visit
Admit: 2023-03-04 | Discharge: 2023-03-04 | Payer: MEDICAID | Attending: Student in an Organized Health Care Education/Training Program | Primary: Internal Medicine

## 2023-03-04 DIAGNOSIS — M79652 Pain in left thigh: Secondary | ICD-10-CM

## 2023-03-04 MED ORDER — GABAPENTIN 100 MG PO CAPS
100 MG | ORAL_CAPSULE | Freq: Every evening | ORAL | 11 refills | Status: AC
Start: 2023-03-04 — End: 2024-03-03

## 2023-03-04 NOTE — Progress Notes (Signed)
Consult Note  [facility]  Patient Name: Norma Ignasiak  DOB: November 20, 1960        Subjective:   Reason for consult:   Patient seen and examined. Chart reviewed in detail.    62 y.o. -male with PMH hypertension, "borderline diabetes," presenting to [facility] for bilateral leg pains    He tells me he had a fall in March 2022 during which he landed on his buttock, and the following day awoke with severe "stabbing" constant pain in his left midline anterior thigh extending into left knee.  He says gradually he had an element of a similar sharp pain in the same distribution of his right anterior thigh, both of which have persisted since then and remained constant.  He does feel "occasional" weakness in his legs.  He does have frequent episodes of "spasms" in his right leg.  He does not feel the pain is "shooting." Gait and balance is reportedly effected and there have been 2  falls reported. ?    He has been tried on gabapentin  to improve the dysesthesia with significant benefit. Does make him really sedated. Can't use it during the day.   ?  He denies history of alcoholism, chemotherapy, radiation. Was recently found to have borderline elevated hemoglobin A1c at 6.5 but has not yet been started on medication.  ?  + family history of peripheral neuropathy, dad lost toes from it he says .    January lab work included normal TSH, elevated hemoglobin A1c at 6.5         No data to display                 No past medical history on file. :   No past surgical history on file.      No Known Allergies Patient denies others  Social History     Socioeconomic History    Marital status: Unknown     Spouse name: Not on file    Number of children: Not on file    Years of education: Not on file    Highest education level: Not on file   Occupational History    Not on file   Tobacco Use    Smoking status: Not on file    Smokeless tobacco: Not on file   Substance and Sexual Activity    Alcohol use: Not on file    Drug  use: Not on file    Sexual activity: Not on file   Other Topics Concern    Not on file   Social History Narrative    Not on file     Social Determinants of Health     Financial Resource Strain: Not on file   Food Insecurity: Not on file   Transportation Needs: Not on file   Physical Activity: Not on file   Stress: Not on file   Social Connections: Not on file   Intimate Partner Violence: Not on file   Housing Stability: Not on file    Patient denies others  No family history on file. Patient denies others.       ROS (10 systems)  10 point review of systems was completed and was negative except the above.     Physical Exam:       @NEWVITALS @   Wt Readings from Last 3 Encounters:   No data found for Wt     Temp Readings from Last 3 Encounters:   No data found for Temp  BP Readings from Last 3 Encounters:   03/04/23 114/78     Pulse Readings from Last 3 Encounters:   03/04/23 (!) 101      Gen: A&O x 4, NAD, cooperative  HEENT: NC/AT, EOMI, PERRL, mmm, neck supple, no meningeal signs;   ,   Heart: without central or acrocyanosis  Lungs: Without signs of respiratory distress  Ext: no edema, no calf tenderness b/l  Psych: normal mood and affect  Skin: no rashes or lesions    NEUROLOGIC EXAM:     Mental Status: A&O to self, location, month and year, NAD, speech clear, language fluent, repetition and naming intact, follows commands appropriately    Cranial Nerve Exam:   CN II-XII: PERRL, VFF, no nystagmus, no gaze paresis, sensation V1-V3 intact b/l, muscles of facial expression symmetric; hearing intact to conversational tone, palate elevates symmetrically, shoulder elevation symmetric and tongue protrudes midline with movement side to side.    Motor Exam:       Strength 5/5 UE's/LE's b/l  Spasticity noted RLE  No pronator drift    Deep Tendon Reflexes: 2/4 brachioradialis, 2+ patellar BL, 2/4 L and 0/4 R  achilles b/l. + Hoffman BL.  Without ankle clonus BL.    Sensation: Intact light touch UE's/LE's b/l  BL 4/5  distribution decreased temp   BL LE decreased temp, vib, pin    Coordination/Cerebellum:       Tremors--none      Finger-to-Nose: no dysmetria b/l    Gait and stance:      Gait: Unsteady, walks with knees bent      LABS:        CBC: No results for input(s): "WBC", "RBC", "HGB", "HCT", "PLT", "MCV" in the last 72 hours.  BMP:  No results for input(s): "NA", "K", "CL", "CO2", "BUN", "CREATININE", "GLUCOSE", "CALCIUM" in the last 72 hours.    IMAGING:      No recent neuroimaging  ASSESSMENT/PLAN:       ICD-10-CM    1. Bilateral thigh pain  M79.651     M79.652       2. Vitamin D deficiency  E55.9 Vitamin D 25 Hydroxy      3. Idiopathic peripheral neuropathy  G60.9 Vitamin B12 & Folate     Vitamin D 25 Hydroxy     Electrophoresis Protein, Serum     PROTEIN ELECTROPHORESIS, URINE     Iron and TIBC     Nerve Conduction Test with EMG     gabapentin (NEURONTIN) 100 MG capsule     [facility] [place]      4. Lumbosacral radiculopathy  M54.17 Nerve Conduction Test with EMG           62 y.o. -male with PMH hypertension and borderline diabetes presenting with bilateral constant midline anterior thigh and knee "sharp" pain which started within 24 hours of a fall 2 years ago and has remained since then.     His interesting neurologic exam today is consistent with significant peripheral neuropathy and possible bilateral L4/5 radiculopathy.  We discussed that PN would not explain his symptoms, but consideration for bilateral L4 distribution of pain could possibly.  He also has some spasticity in his right leg and brisk reflexes at the bilateral patellas, which are both unexpected and would not fit well with bilateral L4 radiculopathies.  As such, if his EMG is unrevealing I will likely obtain MRI of the cervical, thoracic, and lumbar spine to rule out myelopathy and assess for  any potentially surgical target for radiculopathy.      Will obtain EMG of the bilateral lower extremities.    Gabapentin 100 mg nightly -  helpful however limited by sedation.  Will need MRIs of cervical, lumbar and thoracic spine if EMGs unrevealing   Physical therapy referral is placed  We discussed that while diabetes is a common cause of peripheral neuropathy seen on his exam today, I would not expect such severe exam changes from his newly identified borderline A1c.  Will therefore obtain lab work looking for other metabolic causes (R60, folate, vitamin D, SPEP, UPEP, iron studies)    Neurodiagnostics as above      We will plan to see the patient back in 4 months (would prefer to see him sooner, but EMGs will not be done until August).     > 60 min were spent on this encounter including chart review, reviewing prior imaging and lab work, in interview and exam of patient, and in documentation.    (Please note that portions of this note were completed with a voice recognition program.  Efforts were made to edit the dictations but occasionally words are mistranscribed.)      Primus Bravo, DO, 03/04/2023

## 2023-03-17 ENCOUNTER — Inpatient Hospital Stay: Admit: 2023-03-17 | Payer: MEDICAID | Primary: Internal Medicine

## 2023-03-17 DIAGNOSIS — G609 Hereditary and idiopathic neuropathy, unspecified: Secondary | ICD-10-CM

## 2023-03-17 NOTE — Other (Signed)
[employer] medicaid does not require precert,  pt allowed 30 visits per year

## 2023-03-17 NOTE — Plan of Care (Signed)
Outpatient Physical Therapy           [place]           [x]  Phone: [phone]   Fax: [phone]  [name]           []  Phone: [phone]   Fax: [phone]     To: Primus Bravo, DO     From: Evert Kohl, PT, DPT, Cert. DN      Patient: Joe Avery       DOB: November 13, 1960  Diagnosis: Idiopathic peripheral neuropathy [G60.9]    Treatment Diagnosis: decreased BLE strength, STS and gait impairments, tremors  Date: 03/17/2023    Physical Therapy Certification/Re-Certification Form  Dear Dr. Laural Benes,   The following patient has been evaluated for physical therapy services and for therapy to continue, insurance requires physician review of the treatment plan initially and every 90 days. Please review the attached evaluation and/or summary of the patient's plan of care, and verify that you agree therapy should continue by signing the attached document and sending it back to our office.    Assessment:    Assessment: Pt is a 62 yo male that presents to therapy with complaints of BLE pain and fatigue. Pt requires increased time for completion of activities. he was diagnosed with IPN two years ago and has been having memory issues since then. STS demo shaking of BLE and bilateral knee flexion. pt amb without AD, with bilateral knees flexed and looking down at foot placement with trunk flexed, wide BOS. Pt does demo some tremors of BLE and has some short term memory impairments. Pt lives in the basement and has to negotiate stairs everyday at home. He does not use an AD, although it was suggested to him recently. He has underwent many tests (not in [place] system) but has not had a brain MRI. He has an EMG scheduled for August.  Pt demo s/s of a neurodegenerative disorder but has not been tested regarding those. He has to sit down to get dressed so that he doesnt fall. Pt demo impaired BLE strength, STS, gait, balance, proprioception, activity tolerance, stair negotiation, adls and increased pain. Pt would  benefit from continued skilled physical therapy to address impairments and limitations, progress toward goal completion, promote independence with ADLs, and prevent further injury.    Patient agrees with established plan of care and assisted in the development of their short term and long term goals. Patient had no adverse reaction with initial treatment and there are no barriers to learning.  Learning preferences include demonstration, practice, and handouts.  Patient expressed understanding of HEP and appears to be motivated to participate in an active PT program including compliance with HEP expectations.        Plan of Care/Treatment to date:  [x]  Therapeutic Exercise  []  Modalities:  [x]  Therapeutic Activity     []  Ultrasound  []  Electrical Stimulation  [x]  Gait Training      []  Cervical Traction []  Lumbar Traction  [x]  Neuromuscular Re-education    []  Cold/hotpack []  Iontophoresis   [x]  Instruction in HEP      []  Vasopneumatic    []  Dry Needling  [x]  Manual Therapy               []  Aquatic Therapy       Other:          Frequency/Duration:  # Days per week: []  1 day # Weeks: []  1 week []  5 weeks     [x]  2 days   []   2 weeks [x]  6 weeks     []  3 days   []  3 weeks []  7 weeks     []  4 days   []  4 weeks []  8 weeks         []  9 weeks []  10 weeks         []  11 weeks []  12 weeks    Rehab Potential/Progress: []  Excellent []  Good [x]  Fair  []  Poor     Goals:    Patient goals: get back to normal  Short term goals  Time Frame for Short term goals: 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 DN   , 03/17/2023, 3:56 PM        If you have any questions or concerns, please don't hesitate to call.  Thank you for your referral.      Physician Signature:________________________________Date:_________ TIME: _____  By signing above, therapist's plan is approved by physician

## 2023-03-17 NOTE — Other (Addendum)
Outpatient Physical Therapy  [place]           [x]  Phone: [phone]   Fax: [phone]  [name]           []  Phone: [phone]   Fax: [phone]        Physical Therapy Daily Treatment Note  Date:  03/17/2023    Patient Name:  Shaheer [place]    DOB:  Jan 03, 1961  MRN: [id number]  Restrictions/Precautions: Restrictions/Precautions: Fall Risk        Diagnosis:   Idiopathic peripheral neuropathy [G60.9]    Date of Injury/Surgery:   Treatment Diagnosis:  decreased BLE strength, STS and gait impairments, tremors  Insurance/Certification information: [employer]  Referring Physician:  Primus Bravo, DO     PCP: Rande Brunt, MD  Next Doctor Visit:    Plan of care signed (Y/N):  sent 5/7  Outcome Measure:   ABC: 44%  5x STS: 33.42 sec   TUG: 20.36 sec   Visit# / total visits:  1 /12  Pain level: 5/10   Goals:     Patient goals: get back to normal  Short term goals  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 Pt requires increased time for completion of activities. he was diagnosed with IPN two years ago and has been having memory issues since then. STS demo shaking of BLE and bilateral knee flexion. pt amb without AD, with bilateral knees flexed and looking down at foot placement with trunk flexed, wide BOS. Pt does demo some tremors of BLE and has some short term memory impairments. Pt lives in the basement and  has to negotiate stairs everyday at home. He does not use an AD, although it was suggested to him recently. He has underwent many tests (not in [place] system) but has not had a brain MRI. He has an EMG scheduled for August.  Pt demo s/s of a neurodegenerative disorder but has not been tested regarding those. He has to sit down to get dressed so that he doesnt fall. Pt demo impaired BLE strength, STS, gait, balance, proprioception, activity tolerance, stair negotiation, adls and increased pain. Pt would benefit from continued skilled physical therapy to address impairments and limitations, progress toward goal completion, promote independence with ADLs, and prevent further injury.        Subjective:  See eval         Any changes in Ambulatory Summary Sheet?  None        Objective:  See eval           Exercises: (No more than 4 columns)   Exercise/Equipment 03/17/23 #1  Date Date           WARM UP  Nustep                TABLE      STS x5     LAQ 5x3" ea     Seated marches X10 alt ea      Seated clam  RTB x10      Seated ball squeeze  10x5"      bridges      SLR       STANDING      Step up ant                                               PROPRIOCEPTION      NBOS      tandem      Marches on foam       Cone taps             MODALITIES                      Other Therapeutic Activities/Education:  HEP and importance of completion, POC and goals, anatomy and physiology related to condition        Home Exercise Program:  Issued, practiced and pt demo ability to perform on eval date  5/7: RTB given   Access Code: [email or web address]   URL: [email or web address]   Date: 03/17/2023   Prepared by: Clint Bolder, PT, DPT, Cert. DN       Exercises   - Seated Long Arc Quad  - 2 x daily - 7 x weekly - 2 sets - 10 reps - 3 hold   - Seated March  - 2 x daily - 7 x weekly - 2 sets - 10 reps   - Sit to Stand with Armchair  - 2 x daily - 7 x weekly - 2 sets - 10 reps   - Seated Hip Abduction with Resistance  - 2 x daily - 7 x weekly - 2  sets - 10 reps   - Seated Hip Adduction Isometrics with Ball  - 2 x daily - 7 x weekly - 2 sets - 10 reps - 5 hold       Manual Treatments:  none      Modalities:  none      Communication with other providers:  POC sent       Assessment:    Assessment: Pt is a 62 yo male that presents to therapy with complaints of BLE pain and fatigue. Pt requires increased time for completion of activities. he was diagnosed with IPN two years ago and has been having memory issues since then. STS demo shaking of BLE and bilateral knee flexion. pt amb without AD, with bilateral knees flexed and looking down at foot placement with trunk flexed, wide BOS. Pt does demo some tremors of BLE and has some short term memory impairments. Pt lives in the basement and has to negotiate stairs everyday at home. He does not use an AD, although it was suggested to him recently. He has underwent many tests (not in [place] system) but has not had a brain MRI. He has an EMG scheduled for August.  Pt demo s/s of a neurodegenerative disorder but has not been tested regarding those. He has to sit down to get dressed so that he doesnt fall. Pt demo impaired BLE strength, STS, gait, balance, proprioception, activity  tolerance, stair negotiation, adls and increased pain. Pt would benefit from continued skilled physical therapy to address impairments and limitations, progress toward goal completion, promote independence with ADLs, and prevent further injury. End pain: same       Plan for Next Session:   Specific Instructions for Next Treatment: BLE strength, STS, gait, balance, proprioception, stair negotiation    Time In / Time Out:   1424/1501         Timed Code/Total Treatment Minutes: 10 /37: 1 mod eval 1 TE       Next Progress Note due:  10th visit       Plan of Care Interventions:  [x]  Therapeutic Exercise  []  Modalities:  [x]  Therapeutic Activity     []  Ultrasound  []  Estim  [x]  Gait Training      []  Cervical Traction []  Lumbar Traction  [x]   Neuromuscular Re-education    []  Cold/hotpack []  Iontophoresis   [x]  Instruction in HEP      []  Vasopneumatic   []  Dry Needling    [x]  Manual Therapy               []  Aquatic Therapy              Electronically signed by:  Evert Kohl, PT, DPT, Cert. DN   , 03/17/2023, 3:57 PM

## 2023-03-17 NOTE — Progress Notes (Signed)
Physical Therapy: Initial Evaluation    Patient: Joe Avery (62 y.o. male)   Examination Date: 03/17/2023  Plan of Care Certification Period:03/17/2023 to        DOB:  05-05-1961 ;   DOB Confirmed: Yes MRN: [id number]  CSN: [id number]   Insurance: Payor: [employer] [place] [place] / Plan: [employer] [place] / Product Type: *No Product type* /   Insurance ID: [id number] - (Medicaid Managed) Secondary Insurance (if applicable):    Referring Physician: Primus Bravo, DO     PCP: Rande Brunt, MD Visits to Date/Visits Approved:   /      No Show/Cancelled Appts:   /       Medical Diagnosis: Idiopathic peripheral neuropathy [G60.9]    Treatment Diagnosis: decreased BLE strength, STS and gait impairments, tremors     PERTINENT MEDICAL HISTORY   Patient Assessed for Rehabilitation Services: Yes       Medical History: Chart Reviewed: Yes  No past medical history on file.  Surgical History: No past surgical history on file.    Medications:   Current Outpatient Medications:     amLODIPine (NORVASC) 10 MG tablet, Take 1 tablet by mouth daily, Disp: , Rfl:     aspirin 325 MG EC tablet, Take 1 tablet by mouth daily, Disp: , Rfl:     hydroCHLOROthiazide 12.5 MG tablet, Take 1 tablet by mouth daily, Disp: , Rfl:     lisinopril (PRINIVIL;ZESTRIL) 40 MG tablet, Take 1 tablet by mouth daily, Disp: , Rfl:     gabapentin (NEURONTIN) 100 MG capsule, Take 1 capsule by mouth nightly., Disp: 30 capsule, Rfl: 11  Allergies: Patient has no known allergies.       SUBJECTIVE EXAMINATION      ,           Subjective History:   Pt reports that he has had neuropathy for about 2 years now. He is currently in 5-6/10 pain in bilateral knees, yesterday it was the whole legs bilaterally. Pt denies any n/t currently. He denies any falls within the last month, notes that his last fall was a couple months ago. He reports that his bedroom is in the basement, is living with his parents right now cause he just moved back from [facility]. He notes that the stairs can be  so/so, reports that he is slow. He reports that he does not use an AD normally, it was suggested to him by others. He reports that he is having some memory problems and reports that they started when the IPN started. He reports that he has to get dressed sitting down so that he doesnt fall. He reports that he is able to walk for 10-15 minutes before he has to sit down. He denies having any knee replacements. ABC: 44%    Additional Pertinent Hx (if applicable): HTN, smoker, DM             Learning/Language: Learning  Does the patient/guardian have any barriers to learning?: No barriers  What is the preferred language of the patient/guardian?: English  How does the patient/guardian prefer to learn new concepts?: Listening, Reading, Demonstration, Pictures/Videos     Pain Screening    Pain Screening  Patient Currently in Pain: Yes  Pain Assessment: 0-10  Pain Level: 5  Pain Location: Knee    Functional Status         Social History:    Social History  Lives With: Parent  Home Layout: Two level (bedroom in basement)  Home Access:  Level entry  Home Equipment: None    Occupation/Interests:  Occupation: Unemployed  Leisure & Hobbies: cooking    Prior Level of Function:  indep          Current Level of Function:  mod indep      ADL Assistance: Psychologist, sport and exercise Assistance: Independent  Ambulation Assistance: Independent  Transfer Assistance: Surveyor, minerals: Yes    OBJECTIVE EXAMINATION   Restrictions:  Restrictions/Precautions: Fall Risk           Review of Systems:  Follows Commands: Within Functional Limits    Observations:  General Observations  Description: short term memory impairment. STS with bilateral knees bent and shaking. some tremors of BLE.    Ambulation/Gait (if applicable):  Ambulation  Quality of Gait: pt amb without AD, with bilateral knees flexed and looking down at foot placement with trunk flexed, wide BOS.  Comments: 5x STS: 33.42 sec. TUG: 20.36 sec.    Left AROM  Right AROM          AROM LLE (degrees)  LLE AROM : WFL    AROM RLE (degrees)  RLE AROM: WFL       Left Strength  Right Strength         Strength LLE  L Hip Flexion: 4-/5  L Hip ABduction: 4-/5  L Hip ADduction: 4-/5  L Knee Flexion: 3+/5  L Knee Extension: 3+/5  L Ankle Dorsiflexion: 4-/5  L Ankle Plantar Flexion: 4-/5    Strength RLE  R Hip Flexion: 4-/5  R Hip ABduction: 4-/5  R Hip ADduction: 4-/5  R Knee Flexion: 4-/5  R Knee Extension: 4-/5  R Ankle Dorsiflexion: 4-/5  R Ankle Plantar flexion: 4-/5          ASSESSMENT     Impression:Assessment: Pt is a 62 yo male that presents to therapy with complaints of BLE pain and fatigue. Pt requires increased time for completion of activities. he was diagnosed with IPN two years ago and has been having memory issues since then. STS demo shaking of BLE and bilateral knee flexion. pt amb without AD, with bilateral knees flexed and looking down at foot placement with trunk flexed, wide BOS. Pt does demo some tremors of BLE and has some short term memory impairments. Pt lives in the basement and has to negotiate stairs everyday at home. He does not use an AD, although it was suggested to him recently. He has underwent many tests (not in [place] system) but has not had a brain MRI. He has an EMG scheduled for August.  Pt demo s/s of a neurodegenerative disorder but has not been tested regarding those. He has to sit down to get dressed so that he doesnt fall. Pt demo impaired BLE strength, STS, gait, balance, proprioception, activity tolerance, stair negotiation, adls and increased pain. Pt would benefit from continued skilled physical therapy to address impairments and limitations, progress toward goal completion, promote independence with ADLs, and prevent further injury.    Patient agrees with established plan of care and assisted in the development of their short term and long term goals. Patient had no adverse reaction with initial treatment and there are no barriers to learning.  Learning  preferences include demonstration, practice, and handouts.  Patient expressed understanding of HEP and appears to be motivated to participate in an active PT program including compliance with HEP expectations.        Body Structures, Functions, Activity Limitations Requiring Skilled Therapeutic Intervention: Decreased functional  mobility , Decreased ADL status, Decreased strength, Decreased tolerance to work activity, Decreased endurance, Decreased balance, Decreased high-level IADLs, Increased pain    Statement of Medical Necessity: Physical Therapy is both indicated and medically necessary as outlined in the POC to increase the likelihood of meeting the functionally related goals stated below.     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                                                                   Long Term Goals Completed by 12 visits Goal Status   Pt will report overall improvement in condition by 50% or more     pt will improve ABC to 53% or more to show MDC and subjective improvement     pt will improve TUG to 15 sec or less for improved ambulation in home     pt will improve 5x STS to 25 sec or less for improved getting out of chairs     pt will improve L knee extension strength to 4-/5 for improved strength in stance phase of gait                                        TREATMENT PLAN       Requires PT Follow-Up: Yes  Specific Instructions for Next Treatment: BLE strength, STS, gait, balance, proprioception, stair negotiation    Pt. actively involved in establishing Plan of Care and Goals:  Yes  Patient/ Caregiver education and instruction:Goals, PT Role, Plan of Care, Evaluative findings, Insurance, Home Exercise Program, IT sales professional, Investment banker, operational, Functional Mobility Training, Fall prevention strategies             Treatment may include any combination of the following: Current Treatment Recommendations: Strengthening, Balance training, Functional mobility training, ADL/Self-care training, IADL training, Endurance training, Cognitive/Perceptual training, Gait training, Stair training, Neuromuscular re-education, Manual, Pain management, Home exercise program, Safety education & training, Patient/Caregiver education & training, Equipment evaluation, education, & procurement, Therapeutic activities     Frequency / Duration:  Patient to be seen 2 for 6 weeks       Eval Complexity:    Decision Making: Medium Complexity          Therapist Signature: Evert Kohl, PT, DPT, Cert. DN      Date: 11/15/1094     I certify that the above Therapy Services are being furnished while the patient is under my care. I agree with the treatment plan and certify that this therapy is necessary.      Physician's Signature:  ___________________________   Date:_______  Primus Bravo, DO        Physician Comments: _______________________________________________    Please sign and return to [facility].  Please fax to the location listed below. THANK YOU for this referral!    [facility]  [facility]  [address]  [place] [address]  Dept: [phone]  Dept Fax: [phone]  Loc: [phone]       POC NOTE

## 2023-03-21 ENCOUNTER — Inpatient Hospital Stay: Admit: 2023-03-20 | Payer: MEDICAID | Primary: Internal Medicine

## 2023-03-21 NOTE — Other (Signed)
Outpatient Physical Therapy  [place]           [x]  Phone: [phone]   Fax: [phone]  [name]           []  Phone: [phone]   Fax: [phone]        Physical Therapy Daily Treatment Note  Date:  03/21/2023    Patient Name:  Joe Avery    DOB:  1960/12/31  MRN: [id number]  Restrictions/Precautions: Restrictions/Precautions: Fall Risk        Diagnosis:   Idiopathic peripheral neuropathy [G60.9]    Date of Injury/Surgery:   Treatment Diagnosis:  decreased BLE strength, STS and gait impairments, tremors  Insurance/Certification information: [employer]  Referring Physician:  Primus Bravo, DO     PCP: Rande Brunt, MD  Next Doctor Visit:    Plan of care signed (Y/N):  sent 5/7  Outcome Measure:   ABC: 44%  5x STS: 33.42 sec   TUG: 20.36 sec   Visit# / total visits:  2 /12  Pain level: 5/10   Goals:     Patient goals: get back to normal  Short term goals  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 Pt requires increased time for completion of activities. he was diagnosed with IPN two years ago and has been having memory issues since then. STS demo shaking of BLE and bilateral knee flexion. pt amb without AD, with bilateral knees flexed and looking down at foot placement with trunk flexed, wide BOS. Pt does demo some tremors of BLE and has some short term memory impairments. Pt lives in the basement and has to  negotiate stairs everyday at home. He does not use an AD, although it was suggested to him recently. He has underwent many tests (not in [facility] system) but has not had a brain MRI. He has an EMG scheduled for August.  Pt demo s/s of a neurodegenerative disorder but has not been tested regarding those. He has to sit down to get dressed so that he doesnt fall. Pt demo impaired BLE strength, STS, gait, balance, proprioception, activity tolerance, stair negotiation, adls and increased pain. Pt would benefit from continued skilled physical therapy to address impairments and limitations, progress toward goal completion, promote independence with ADLs, and prevent further injury.      Subjective:  Pt reports he felt like there was glass being jammed into his left knee and to a lesser extent the right knee.      Any changes in Ambulatory Summary Sheet?  None      Objective:  See below       Exercises: (No more than 4 columns)   Exercise/Equipment 03/17/23 #1  03/21/23  #2 Date           WARM UP  Nustep      S10/A9  L3 x 5.5'          TABLE      STS x5 23" 1x10    LAQ 5x3" ea 1x10 R/L each    Seated marches X10 alt ea  1x10 R/L ea/alt    Seated clam  RTB x10  RTB 1x10    Seated ball squeeze  10x5"  1x10 3" hold    bridges  1x10    SLR   1x10 R/L each    STANDING      Step up ant  6" 1x8 R/L each                                             PROPRIOCEPTION      NBOS      tandem  30" R/L ea fwd    Marches on foam   1x20 R/L ea/alt    Cone taps             MODALITIES                    Other Therapeutic Activities/Education:  HEP and importance of completion, POC and goals, anatomy and physiology related to condition    Home Exercise Program:  Issued, practiced and pt demo ability to perform on eval date  5/7: RTB given   Access Code: [email or web address]   URL: [email or web address]   Date: 03/17/2023   Prepared by: Clint Bolder, PT, DPT, Cert. DN       Exercises   - Seated Long Arc Quad  - 2 x daily - 7 x weekly - 2 sets - 10 reps -  3 hold   - Seated March  - 2 x daily - 7 x weekly - 2 sets - 10 reps   - Sit to Stand with Armchair  - 2 x daily - 7 x weekly - 2 sets - 10 reps   - Seated Hip Abduction with Resistance  - 2 x daily - 7 x weekly - 2 sets - 10 reps   - Seated Hip Adduction Isometrics with Ball  - 2 x daily - 7 x weekly - 2 sets - 10 reps - 5 hold     Manual Treatments:  none    Modalities:  none    Communication with other providers:  POC sent     Assessment:  Pt is instructed with each listed activity and is able to demo adequate to good form overall.  Pt does have some complaints of muscle burn with activities listed but described as tolerable throughout session.  Pt does well overall today with no increase in complaints of pain.  End pain: 5/10    Assessment: Pt is a 62 yo male that presents to therapy with complaints of BLE pain and fatigue. Pt requires increased time for completion of activities. he was diagnosed with IPN two years ago and has been having memory issues since then. STS demo shaking of BLE and bilateral knee flexion. pt amb without AD, with bilateral knees flexed and looking down at foot placement with trunk flexed, wide BOS. Pt does demo some tremors of BLE and has some short term memory impairments. Pt lives in the basement and has to negotiate stairs everyday at home. He does not use an AD, although it was suggested to him recently.  He has underwent many tests (not in [facility] system) but has not had a brain MRI. He has an EMG scheduled for August.  Pt demo s/s of a neurodegenerative disorder but has not been tested regarding those. He has to sit down to get dressed so that he doesnt fall. Pt demo impaired BLE strength, STS, gait, balance, proprioception, activity tolerance, stair negotiation, adls and increased pain. Pt would benefit from continued skilled physical therapy to address impairments and limitations, progress toward goal completion, promote independence with ADLs, and prevent further injury.      Plan  for Next Session: Specific Instructions for Next Treatment: BLE strength, STS, gait, balance, proprioception, stair negotiation    Time In / Time Out:   1418/1503    Timed Code/Total Treatment Minutes:  TE X 30' X 2;   NR X 15' X 1     Next Progress Note due:  10th visit     Plan of Care Interventions:  [x]  Therapeutic Exercise  []  Modalities:  [x]  Therapeutic Activity     []  Ultrasound  []  Estim  [x]  Gait Training      []  Cervical Traction []  Lumbar Traction  [x]  Neuromuscular Re-education    []  Cold/hotpack []  Iontophoresis   [x]  Instruction in HEP      []  Vasopneumatic   []  Dry Needling    [x]  Manual Therapy               []  Aquatic Therapy            Electronically signed by:  Pollyann Kennedy II, PTA [id number]        03/21/2023, 7:19 AM

## 2023-03-25 ENCOUNTER — Inpatient Hospital Stay: Admit: 2023-03-25 | Payer: MEDICAID | Primary: Internal Medicine

## 2023-03-25 NOTE — Other (Signed)
Outpatient Physical Therapy  [place]           [x]  Phone: [phone]   Fax: [phone]  [name]           []  Phone: [phone]   Fax: [phone]        Physical Therapy Daily Treatment Note  Date:  03/25/2023    Patient Name:  Joe Avery    DOB:  11/27/1960  MRN: [id number]  Restrictions/Precautions: Restrictions/Precautions: Fall Risk        Diagnosis:   Idiopathic peripheral neuropathy [G60.9]    Date of Injury/Surgery:   Treatment Diagnosis:  decreased BLE strength, STS and gait impairments, tremors  Insurance/Certification information: [employer]  Referring Physician:  Primus Bravo, DO     PCP: Rande Brunt, MD  Next Doctor Visit:    Plan of care signed (Y/N):  sent 5/7  Outcome Measure:   ABC: 44%  5x STS: 33.42 sec   TUG: 20.36 sec     Visit# / total visits:  3 /12    Pain level: Rates as discomfort but I'm ok/10     Goals:     Patient goals: get back to normal  Short term goals  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 Pt requires increased time for completion of activities. he was diagnosed with IPN two years ago and has been having memory issues since then. STS demo shaking of BLE and bilateral knee flexion. pt amb without AD, with bilateral knees flexed and looking down at foot placement with trunk flexed, wide BOS. Pt does demo some tremors of BLE and has some short term memory impairments. Pt  lives in the basement and has to negotiate stairs everyday at home. He does not use an AD, although it was suggested to him recently. He has underwent many tests (not in [facility] system) but has not had a brain MRI. He has an EMG scheduled for August.  Pt demo s/s of a neurodegenerative disorder but has not been tested regarding those. He has to sit down to get dressed so that he doesnt fall. Pt demo impaired BLE strength, STS, gait, balance, proprioception, activity tolerance, stair negotiation, adls and increased pain. Pt would benefit from continued skilled physical therapy to address impairments and limitations, progress toward goal completion, promote independence with ADLs, and prevent further injury.      Subjective:  Pt reports HEP went ok , pain seems to move around.       Any changes in Ambulatory Summary Sheet?  None      Objective:  knee flex in crouched stance noted in tandem stance activities.  Over all decreased trunk rotation /  arm swing with gait    Exercises: (No more than 4 columns)     Exercise/Equipment  03/17/23 #1  03/21/23  #2 Date  03-25-23           WARM UP      Nustep      S10/A9  L3 x 5.5' L 5  x5         TABLE      STS x5 23" 1x10    LAQ 5x3" ea 1x10 R/L each  X 10 ea   Seated marches X10 alt ea  1x10 R/L ea/alt X 10 ea   Seated clam  RTB x10  RTB 1x10 RTB x 10   Seated ball squeeze  10x5"  1x10 3" hold x10   bridges  1x10 x10   SLR   1x10 R/L each x10   Trunk rotations   Supine x 10 ea   STANDING      Step up ant  6" 1x8 R/L each                                             PROPRIOCEPTION      NBOS      tandem  30" R/L ea fwd In p// bars ea foot in lead x 5 reps with 5 sec hold. Required intermittent UE assist.   Marches on foam   1x20 R/L ea/alt    Cone taps             MODALITIES                    Other Therapeutic Activities/Education:  HEP and importance of completion, POC and goals, anatomy and physiology related to condition    Home Exercise Program:  Issued, practiced and pt demo  ability to perform on eval date  5/7: RTB given   Access Code: [email or web address]   URL: [email or web address]   Date: 03/17/2023   Prepared by: Clint Bolder, PT, DPT, Cert. DN   Exercises   - Seated Long Arc Quad  - 2 x daily - 7 x weekly - 2 sets - 10 reps - 3 hold   - Seated March  - 2 x daily - 7 x weekly - 2 sets - 10 reps   - Sit to Stand with Armchair  - 2 x daily - 7 x weekly - 2 sets - 10 reps   - Seated Hip Abduction with Resistance  - 2 x daily - 7 x weekly - 2 sets - 10 reps   - Seated Hip Adduction Isometrics with Ball  - 2 x daily - 7 x weekly - 2 sets - 10 reps - 5 hold     Access Code: [email or web address]  URL: [email or web address]  Date: 03/25/2023  Prepared by: Laroy Apple  Exercises  - Supine Bridge  - 1 x daily - 7 x weekly - 3 sets - 10 reps  - Supine Straight Leg Raises  - 1 x daily - 7 x weekly - 3 sets - 10 reps  - Supine Lower Trunk Rotation  - 1 x daily - 7 x weekly - 3 sets - 10 reps    Manual Treatments:  none    Modalities:  none    Communication with other providers:  POC sent     Assessment:  Pt is instructed with each listed activity and is able to demo adequate to good form overall.  Pt does have some complaints of muscle burn  with activities listed but described as tolerable throughout session.  Pt does well overall today with no increase in complaints of pain.  Overall very stiff with gait and transfers lacking rotational patterns. May benefit from cross body activities.     End pain:  " not any worse" /10    Assessment: Pt is a 62 yo male that presents to therapy with complaints of BLE pain and fatigue. Pt requires increased time for completion of activities. he was diagnosed with IPN two years ago and has been having memory issues since then. STS demo shaking of BLE and bilateral knee flexion. pt amb without AD, with bilateral knees flexed and looking down at foot placement with trunk flexed, wide BOS. Pt does demo some tremors of BLE and has some short term memory impairments. Pt  lives in the basement and has to negotiate stairs everyday at home. He does not use an AD, although it was suggested to him recently. He has underwent many tests (not in [facility] system) but has not had a brain MRI. He has an EMG scheduled for August.  Pt demo s/s of a neurodegenerative disorder but has not been tested regarding those. He has to sit down to get dressed so that he doesnt fall. Pt demo impaired BLE strength, STS, gait, balance, proprioception, activity tolerance, stair negotiation, adls and increased pain. Pt would benefit from continued skilled physical therapy to address impairments and limitations, progress toward goal completion, promote independence with ADLs, and prevent further injury.      Plan for Next Session: Specific Instructions for Next Treatment: BLE strength, STS, gait, balance, proprioception, stair negotiation    Time In / Time Out:   [id number]    Timed Code/Total Treatment Minutes:  43/43 = 2 te, 1 neuro    Next Progress Note due:  10th visit     Plan of Care Interventions:  [x]  Therapeutic Exercise  []  Modalities:  [x]  Therapeutic Activity     []  Ultrasound  []  Estim  [x]  Gait Training      []  Cervical Traction []  Lumbar Traction  [x]  Neuromuscular Re-education    []  Cold/hotpack []  Iontophoresis   [x]  Instruction in HEP      []  Vasopneumatic   []  Dry Needling    [x]  Manual Therapy               []  Aquatic Therapy            Electronically signed by:  Ira Dougher L. Clovis Riley PTA [id number]     03/25/2023, 8:25 AM

## 2023-03-28 ENCOUNTER — Inpatient Hospital Stay: Admit: 2023-03-27 | Discharge: 2023-03-28 | Payer: MEDICAID | Primary: Internal Medicine

## 2023-03-28 NOTE — Other (Cosign Needed)
Outpatient Physical Therapy  [place]           [x]  Phone: [phone]   Fax: [phone]  Joe Avery           []  Phone: [phone]   Fax: [phone]        Physical Therapy Daily Treatment Note  Date:  03/28/2023    Patient Name:  Joe Avery    DOB:  [birth year]  MRN: [id number]  Restrictions/Precautions: Restrictions/Precautions: Fall Risk  Diagnosis:   Idiopathic peripheral neuropathy [G60.9]    Date of Injury/Surgery:   Treatment Diagnosis:  decreased BLE strength, STS and gait impairments, tremors  Insurance/Certification information: [employer]  Referring Physician:  Primus Bravo, DO     PCP: Rande Brunt, MD  Next Doctor Visit:    Plan of care signed (Y/N):  Y  Outcome Measure:   ABC: 44%  5x STS: 33.42 sec   TUG: 20.36 sec   Visit# / total visits:  4 /12  Pain level: Rates as discomfort but I'm ok/10     Goals:     Patient goals: get back to normal  Short term goals  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 Pt requires increased time for completion of activities. he was diagnosed with IPN two years ago and has been having memory issues since then. STS demo shaking of BLE and bilateral knee flexion. pt amb without AD, with bilateral knees flexed and looking down at foot placement with trunk flexed, wide BOS. Pt does demo some tremors of BLE and has some short term memory impairments. Pt lives in  the basement and has to negotiate stairs everyday at home. He does not use an AD, although it was suggested to him recently. He has underwent many tests (not in [facility] system) but has not had a brain MRI. He has an EMG scheduled for August.  Pt demo s/s of a neurodegenerative disorder but has not been tested regarding those. He has to sit down to get dressed so that he doesnt fall. Pt demo impaired BLE strength, STS, gait, balance, proprioception, activity tolerance, stair negotiation, adls and increased pain. Pt would benefit from continued skilled physical therapy to address impairments and limitations, progress toward goal completion, promote independence with ADLs, and prevent further injury.      Subjective: Joe Avery arrives to therapy stating that there is no pain anywhere today. Feels a little off balance today. He states that he feels like he might be improving a little with his balance. Yesterday didn't have a good day, was getting on his bike at home and lost his balance falling backwards. He felt very unbalanced with walking yesterday too. Reports roughly 50% improvement overall.       Any changes in Ambulatory Summary Sheet?  None      Objective:    Difficulty coming to standing from nu-step machine, took 3 trials.   Unsteadiness with gait, short/shuffling steps.   Very shaky quads with eccentric activity R > L.  Uses hands to raise legs up onto mat table for sit > supine.   B knee bend during heel raises.   Tactile cues at hips and shoulders for upright posture during STS.      Exercises: (No more than 4 columns)   Exercise/Equipment 03/21/23  #2 Date  03-25-23 03/28/2023 #4           WARM UP      Nustep      S10/A9 L3 x 5.5' L 5  x5 L5 5'         TABLE      STS 23" 1x10  10* 24.5" no UE assist - CGA from therapist   LAQ 1x10 R/L each  X 10 ea 10* ea   Seated marches 1x10 R/L ea/alt X 10 ea 10* ea   Seated clam  RTB 1x10 RTB x 10 RTB 10*   Seated ball squeeze  1x10 3" hold x10 10*   bridges 1x10 x10 10*   SLR   1x10 R/L each x10 10* ea    Trunk rotations  Supine x 10 ea 10* ea         STANDING      Step up ant 6" 1x8 R/L each  6" 10* ea   HR/TR   10*    Marching   10* ea   Hip abd/ext                              PROPRIOCEPTION      NBOS   -   tandem 30" R/L ea fwd In p// bars ea foot in lead x 5 reps with 5 sec hold. Required intermittent UE assist. -   Marches on foam  1x20 R/L ea/alt  -   Cone taps    -         MODALITIES                    Other Therapeutic Activities/Education:    Home Exercise Program:  Issued, practiced and pt demo ability to perform on eval date  5/7: RTB given   Access Code: [email or web address]   URL: [email or web address]   Date: 03/17/2023   Prepared by: Joe Avery, PT, DPT, Cert. DN   Exercises   - Seated Long Arc Quad  - 2 x daily - 7 x weekly - 2 sets - 10 reps - 3 hold   - Seated March  - 2 x daily - 7 x weekly - 2 sets - 10 reps   - Sit to Stand with Armchair  - 2 x daily - 7 x weekly - 2 sets - 10 reps   - Seated Hip Abduction with Resistance  - 2 x daily - 7 x weekly - 2 sets - 10 reps   - Seated Hip Adduction Isometrics with Ball  - 2 x daily - 7 x weekly - 2 sets - 10 reps - 5 hold     Access Code: [email or web address]  URL: [email or web address]  Date: 03/25/2023  Prepared by: Joe Avery  Exercises  - Supine Bridge  - 1 x daily - 7 x weekly - 3 sets - 10 reps  - Supine Straight Leg Raises  -  1 x daily - 7 x weekly - 3 sets - 10 reps  - Supine Lower Trunk Rotation  - 1 x daily - 7 x weekly - 3 sets - 10 reps    Manual Treatments:  none    Modalities:  none    Communication with other providers:       Assessment:  Joe Avery is very weak and deconditioned. He requires increased time to complete all activities in the gym and fatigues easily. He will continue to benefit from skilled PT services to address these deficits so he may improve his functional independence and reduce his risk for falling. He has difficulty with motor planning for STS. End session pain: 0/10    Plan for Next Session:  BLE  strength, STS, gait, balance, proprioception, stair negotiation    Time In / Time Out:   1012/1052    Timed Code/Total Treatment Minutes:  52' 2 TE 1 TA      Next Progress Note due:  10th visit     Plan of Care Interventions:  [x]  Therapeutic Exercise  []  Modalities:  [x]  Therapeutic Activity     []  Ultrasound  []  Estim  [x]  Gait Training      []  Cervical Traction []  Lumbar Traction  [x]  Neuromuscular Re-education    []  Cold/hotpack []  Iontophoresis   [x]  Instruction in HEP      []  Vasopneumatic   [x]  Dry Needling    [x]  Manual Therapy               []  Aquatic Therapy            Electronically signed by:  Inda Merlin         03/28/2023, 7:57 AM

## 2023-04-01 ENCOUNTER — Inpatient Hospital Stay: Admit: 2023-04-01 | Payer: MEDICAID | Primary: Internal Medicine

## 2023-04-01 NOTE — Other (Cosign Needed)
Outpatient Physical Therapy  [place]           [x]  Phone: [phone]   Fax: [phone]  [name]           []  Phone: [phone]   Fax: [phone]        Physical Therapy Daily Treatment Note  Date:  04/01/2023    Patient Name:  Joe Avery    DOB:  01/29/1961  MRN: [id number]  Restrictions/Precautions: Restrictions/Precautions: Fall Risk  Diagnosis:   Idiopathic peripheral neuropathy [G60.9]    Date of Injury/Surgery:   Treatment Diagnosis:  decreased BLE strength, STS and gait impairments, tremors  Insurance/Certification information: [employer]  Referring Physician:  Primus Bravo, DO     PCP: Rande Brunt, MD  Next Doctor Visit:    Plan of care signed (Y/N):  Y  Outcome Measure:   ABC: 44%  5x STS: 33.42 sec   TUG: 20.36 sec     Visit# / total visits:  5 /12    Pain level: Rates as discomfort but I'm ok/10     Goals:     Patient goals: get back to normal  Short term goals  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 Pt requires increased time for completion of activities. he was diagnosed with IPN two years ago and has been having memory issues since then. STS demo shaking of BLE and bilateral knee flexion. pt amb without AD, with bilateral knees flexed and looking down at foot placement with trunk flexed, wide BOS. Pt does demo some tremors of BLE and has some short term memory impairments. Pt  lives in the basement and has to negotiate stairs everyday at home. He does not use an AD, although it was suggested to him recently. He has underwent many tests (not in [facility] system) but has not had a brain MRI. He has an EMG scheduled for August.  Pt demo s/s of a neurodegenerative disorder but has not been tested regarding those. He has to sit down to get dressed so that he doesnt fall. Pt demo impaired BLE strength, STS, gait, balance, proprioception, activity tolerance, stair negotiation, adls and increased pain. Pt would benefit from continued skilled physical therapy to address impairments and limitations, progress toward goal completion, promote independence with ADLs, and prevent further injury.      Subjective: Joe Avery arrives to therapy stating that he has good days and bad, its a roller coaster.  Reports he has no questions about current HEP and is agreeable to working on balance and standing activities.      Any changes in Ambulatory Summary Sheet?  None      Objective:    Unsteadiness with gait, short/shuffling steps.   Crouched stance, esp knee flex  Exercises: (No more than 4 columns)   Exercise/Equipment 03/21/23  #2 Date  03-25-23 03/28/2023 #4 04-01-23            WARM UP       Nustep      S10/A9 L3 x 5.5' L 5  x5 L5 5' L5 x 5          TABLE       STS 23" 1x10  10* 24.5" no UE assist - CGA from therapist    LAQ 1x10 R/L each  X 10 ea 10* ea    Seated marches 1x10 R/L ea/alt X 10 ea 10* ea    Seated clam  RTB 1x10 RTB x 10 RTB 10*    Seated ball squeeze  1x10 3" hold x10 10*    bridges 1x10 x10 10*    SLR  1x10 R/L each x10 10* ea     Trunk rotations  Supine x 10 ea 10* ea           STANDING       Step up ant 6" 1x8 R/L each  6" 10* ea 6" , one rail as at home opposite foot to next step up x 10 ea. Frequent cues   HR/TR   10*     Marching   10* ea X 10 ea, cues for light grip for balance and trunk ext.   Hip abd/ext     X 10 ea, BUE for balance cues for light grip and trunk ext.    Standing hamstring  stretch    Foot propped on step x 1 min ea                      PROPRIOCEPTION       NBOS   -    tandem 30" R/L ea fwd In p// bars ea foot in lead x 5 reps with 5 sec hold. Required intermittent UE assist. -    Marches on foam  1x20 R/L ea/alt  -    Cone taps    -           MODALITIES                       Other Therapeutic Activities/Education:    Home Exercise Program:  Issued, practiced and pt demo ability to perform on eval date  5/7: RTB given   Access Code: [email or web address]   URL: [email or web address]   Date: 03/17/2023   Prepared by: Clint Bolder, PT, DPT, Cert. DN   Exercises   - Seated Long Arc Quad  - 2 x daily - 7 x weekly - 2 sets - 10 reps - 3 hold   - Seated March  - 2 x daily - 7 x weekly - 2 sets - 10 reps   - Sit to Stand with Armchair  - 2 x daily - 7 x weekly - 2 sets - 10 reps   - Seated Hip Abduction with Resistance  - 2 x daily - 7 x weekly - 2 sets - 10 reps   - Seated Hip Adduction Isometrics with Ball  - 2 x daily - 7 x weekly - 2 sets - 10 reps - 5 hold     Access Code: [email or web address]  URL: [email or web address]  Date: 03/25/2023  Prepared by: Laroy Apple  Exercises  - Supine Bridge  - 1 x daily - 7 x weekly - 3 sets - 10 reps  -  Supine Straight Leg Raises  - 1 x daily - 7 x weekly - 3 sets - 10 reps  - Supine Lower Trunk Rotation  - 1 x daily - 7 x weekly - 3 sets - 10 reps    Access Code: [email or web address]  URL: [email or web address]  Date: 04/01/2023  Prepared by: Laroy Apple  Exercises  - Standing Hip Abduction with Counter Support  - 1 x daily - 7 x weekly - 3 sets - 10 reps - 3 hold  - Standing Hip Extension with Counter Support  - 1 x daily - 7 x weekly - 3 sets - 10 reps - 3 hold  - Standing March with Counter Support  - 1 x daily - 7 x weekly - 3 sets - 10 reps  - Seated Hamstring Stretch with Chair  - 1 x daily - 7 x weekly - 3 sets - 10 reps - 1 min hold    Manual Treatments:  none    Modalities:  none    Communication with other providers:       Assessment:  Joe Avery is very weak  and deconditioned. He requires increased time to complete all activities in the gym and fatigues easily.  May need intermittent HEP reviews, switched focus in gym to standing strength /balance activities using stairs as prop for HEP as he lives in basement.  He will continue to benefit from skilled PT services to address these deficits so he may improve his functional independence and reduce his risk for falling. He has difficulty with motor planning for STS. End session pain: 0/10    Plan for Next Session:  BLE strength, STS, gait, balance, proprioception, stair negotiation    Time In / Time Out:   1115-1155=40    Timed Code/Total Treatment Minutes:    40/40    Next Progress Note due:  10th visit     Plan of Care Interventions:  [x]  Therapeutic Exercise  []  Modalities:  [x]  Therapeutic Activity     []  Ultrasound  []  Estim  [x]  Gait Training      []  Cervical Traction []  Lumbar Traction  [x]  Neuromuscular Re-education    []  Cold/hotpack []  Iontophoresis   [x]  Instruction in HEP      []  Vasopneumatic   [x]  Dry Needling    [x]  Manual Therapy               []  Aquatic Therapy            Electronically signed by:  Laroy Apple, [id number]         04/01/2023, 8:23 AM

## 2023-04-03 ENCOUNTER — Inpatient Hospital Stay: Admit: 2023-04-03 | Payer: MEDICAID | Primary: Internal Medicine

## 2023-04-03 NOTE — Other (Signed)
Outpatient Physical Therapy  [place]           [x]  Phone: [phone]   Fax: [phone]  [name]           []  Phone: [phone]   Fax: [phone]        Physical Therapy Daily Treatment Note  Date:  04/03/2023    Patient Name:  Joe Avery    DOB:  Apr 06, 1961  MRN: [id number]  Restrictions/Precautions: Restrictions/Precautions: Fall Risk  Diagnosis:   Idiopathic peripheral neuropathy [G60.9]    Date of Injury/Surgery:   Treatment Diagnosis:  decreased BLE strength, STS and gait impairments, tremors  Insurance/Certification information: [employer]  Referring Physician:  Primus Bravo, DO     PCP: Rande Brunt, MD  Next Doctor Visit:    Plan of care signed (Y/N):  Yes  Outcome Measure:   ABC: 44%  5x STS: 33.42 sec   TUG: 20.36 sec     Visit# / total visits:  6 /12    Pain level:  a little  /10    Goals:     Patient goals: get back to normal  Short term goals  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 Pt requires increased time for completion of activities. he was diagnosed with IPN two years ago and has been having memory issues since then. STS demo shaking of BLE and bilateral knee flexion. pt amb without AD, with bilateral knees flexed and looking down at foot placement with trunk flexed, wide BOS. Pt does demo some tremors of BLE and has some short term memory impairments. Pt lives in the basement  and has to negotiate stairs everyday at home. He does not use an AD, although it was suggested to him recently. He has underwent many tests (not in [facility] system) but has not had a brain MRI. He has an EMG scheduled for August.  Pt demo s/s of a neurodegenerative disorder but has not been tested regarding those. He has to sit down to get dressed so that he doesnt fall. Pt demo impaired BLE strength, STS, gait, balance, proprioception, activity tolerance, stair negotiation, adls and increased pain. Pt would benefit from continued skilled physical therapy to address impairments and limitations, progress toward goal completion, promote independence with ADLs, and prevent further injury.      Subjective: pt reports that he woke up with a sharp pain in his LLE, has reduced since this morning. He notes that yesterday he was feeling a little light headed. He denies any falls since last visit, but notes that he is a little wobbly sometimes.       Any changes in Ambulatory Summary Sheet?  None      Objective:    Increased time for completion with activities  Feelings of lightheadedness, multiple rest breaks but pt states that he is okay to move on to the next exercise   Amb without AD, decreased cadence and forward flexed posture  STS- a few attempts to stand on first rep, after first rep he doesn't have to bounce to get up, BLE shaking on 10th rep, very cautious with STS     Exercises: (No more than 4 columns)   Exercise/Equipment 03/28/2023 #4 04-01-23 04/03/23 #6            WARM UP      Nustep      S10/A9 L5 5' L5 x 5 L4 5'          TABLE      STS 10* 24.5" no UE assist - CGA from therapist  X10 from 23" no UE    LAQ 10* ea     Seated marches 10* ea     Seated clam  RTB 10*     Seated ball squeeze  10*     bridges 10*     SLR  10* ea      Trunk rotations 10* ea           STANDING      Step up ant 6" 10* ea 6" , one rail as at home opposite foot to next step up x 10 ea. Frequent cues 6" x5 ea BUE assist    HR/TR 10*       Marching 10* ea X 10 ea, cues for light grip for balance and trunk ext. X10 alt ea BUE assist    Hip abd/ext   X 10 ea, BUE for balance cues for light grip and trunk ext.  X10 ea bilat BUE assist    Standing hamstring stretch  Foot propped on step x 1 min ea                     PROPRIOCEPTION      NBOS -     tandem -     Marches on foam  -     Cone taps  -           MODALITIES                    Other Therapeutic Activities/Education:    Home Exercise Program:  Issued, practiced and pt demo ability to perform on eval date  5/7: RTB given   Access Code: [email or web address]   URL: [email or web address]   Date: 03/17/2023   Prepared by: Clint Bolder, PT, DPT, Cert. DN   Exercises   - Seated Long Arc Quad  - 2 x daily - 7 x weekly - 2 sets - 10 reps - 3 hold   - Seated March  - 2 x daily - 7 x weekly - 2 sets - 10 reps   - Sit to Stand with Armchair  - 2 x daily - 7 x weekly - 2 sets - 10 reps   - Seated Hip Abduction with Resistance  - 2 x daily - 7 x weekly - 2 sets - 10 reps   - Seated Hip Adduction Isometrics with Ball  - 2 x daily - 7 x weekly - 2 sets - 10 reps - 5 hold     Access Code: [email or web address]  URL: [email or web address]  Date: 03/25/2023  Prepared by: Laroy Apple  Exercises  - Supine Bridge  - 1 x daily - 7 x weekly - 3 sets -  10 reps  - Supine Straight Leg Raises  - 1 x daily - 7 x weekly - 3 sets - 10 reps  - Supine Lower Trunk Rotation  - 1 x daily - 7 x weekly - 3 sets - 10 reps    5/22: Access Code: [email or web address]  URL: [email or web address]  Date: 04/01/2023  Prepared by: Laroy Apple  Exercises  - Standing Hip Abduction with Counter Support  - 1 x daily - 7 x weekly - 3 sets - 10 reps - 3 hold  - Standing Hip Extension with Counter Support  - 1 x daily - 7 x weekly - 3 sets - 10 reps - 3 hold  - Standing March with Counter Support  - 1 x daily - 7 x weekly - 3 sets - 10 reps  - Seated Hamstring Stretch with Chair  - 1 x daily - 7 x weekly - 3 sets - 10 reps - 1 min hold    Manual Treatments:   none    Modalities:  none    Communication with other providers: none this date    Assessment:  Pt tolerated treatment well today. [place] was feeling a little lightheaded today, decreased some reps of step ups secondary to him not feeling the best. He is very cautious with STS and every step he takes so that he does not fall.  Pt would benefit from continued skilled physical therapy to address remaining impairments and limitations, progress toward goal completion, promote independence with ADLs, and prevent further injury. end pain: same       Plan for Next Session:  BLE strength, STS, gait, balance, proprioception, stair negotiation    Time In / Time Out:  1106/ 1144    Timed Code/Total Treatment Minutes:    38/38: 2 TE 1 TA     Next Progress Note due:  10th visit     Plan of Care Interventions:  [x]  Therapeutic Exercise  []  Modalities:  [x]  Therapeutic Activity     []  Ultrasound  []  Estim  [x]  Gait Training      []  Cervical Traction []  Lumbar Traction  [x]  Neuromuscular Re-education    []  Cold/hotpack []  Iontophoresis   [x]  Instruction in HEP      []  Vasopneumatic   [x]  Dry Needling    [x]  Manual Therapy               []  Aquatic Therapy            Electronically signed by:  Evert Kohl, PT, DPT, Cert. DN            04/03/2023, 7:14 AM

## 2023-04-08 ENCOUNTER — Inpatient Hospital Stay: Admit: 2023-04-08 | Payer: MEDICAID | Primary: Internal Medicine

## 2023-04-08 NOTE — Other (Signed)
Outpatient Physical Therapy  [place]           [x]  Phone: [phone]   Fax: [phone]  Vanessa Barbara  Phone: [phone]   Fax: [phone]        Physical Therapy Daily Treatment Note  Date:  04/08/2023    Patient Name:  Joe Avery    DOB:  09-20-1961  MRN: [id number]  Restrictions/Precautions: Restrictions/Precautions: Fall Risk  Diagnosis:   Idiopathic peripheral neuropathy [G60.9]    Date of Injury/Surgery:   Treatment Diagnosis:  decreased BLE strength, STS and gait impairments, tremors  Insurance/Certification information: [employer]  Referring Physician:  Primus Bravo, DO     PCP: Rande Brunt, MD  Next Doctor Visit:    Plan of care signed (Y/N):  Yes  Outcome Measure:   ABC: 44%  5x STS: 33.42 sec   TUG: 20.36 sec     Visit# / total visits:  7 /12    Pain level:  " pain is still sleeping, not to bad right now"  /10    Goals:     Patient goals: get back to normal  Short term goals  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 Pt requires increased time for completion of activities. he was diagnosed with IPN two years ago and has been having memory issues since then. STS demo shaking of BLE and bilateral knee flexion. pt amb without AD, with bilateral knees flexed and looking down at foot placement with trunk flexed, wide BOS. Pt does demo some tremors of BLE and has some short term memory  impairments. Pt lives in the basement and has to negotiate stairs everyday at home. He does not use an AD, although it was suggested to him recently. He has underwent many tests (not in [facility] system) but has not had a brain MRI. He has an EMG scheduled for August.  Pt demo s/s of a neurodegenerative disorder but has not been tested regarding those. He has to sit down to get dressed so that he doesnt fall. Pt demo impaired BLE strength, STS, gait, balance, proprioception, activity tolerance, stair negotiation, adls and increased pain. Pt would benefit from continued skilled physical therapy to address impairments and limitations, progress toward goal completion, promote independence with ADLs, and prevent further injury.      Subjective: pt reports that he is not having any of the light headedness so far today.  [place] this morning checking veins and blood pressure in my legs. Reports he still has difficulty getting up from kitchen table chair, feels like it takes a min or more.      Any changes in Ambulatory Summary Sheet?  None      Objective:  LLE knee ext 4-, RLE knee ext  4/5  Knee flex in mid stance  Amb without AD, decreased cadence and forward flexed posture      Exercises: (No more than 4 columns)   Exercise/Equipment 03/28/2023 #4 04-01-23 04/03/23 #6  04-08-23            WARM UP       Nustep      S10/A9 L5 5' L5 x 5 L4 5'  L5  x 5 min          TABLE       STS 10* 24.5" no UE assist - CGA from therapist  X10 from 23" no UE     LAQ 10* ea      Seated marches 10* ea      Seated clam  RTB 10*      Seated ball squeeze  10*      bridges 10*      SLR  10* ea       Trunk rotations 10* ea             STANDING       Step up ant 6" 10* ea 6" , one rail as at home opposite foot to next step up x 10 ea. Frequent cues 6" x5 ea BUE assist  6" step, BIL rail assist x 10 ea   HR/TR 10*       Marching 10* ea X 10 ea, cues for light grip for balance and trunk ext. X10 alt ea BUE assist  X10 alt ea BUE assist   Hip abd/ext   X 10  ea, BUE for balance cues for light grip and trunk ext.  X10 ea bilat BUE assist  X10 ea bilat BUE assis   Standing hamstring stretch  Foot propped on step x 1 min ea  Foot propped on step x 1 min ea   Leg press seat #5    55 # x 10   44 # x 10        Squats     In p// bars with focus on tech x 10 reps light UE   PROPRIOCEPTION       NBOS -      tandem -   In p// bars , alternating ea foot in lead x 5 reps with 3 sec hold. Intermittent UE assist.    Marches on foam  -      [facility]one taps  -   Light taps onto BOSU in p// bars. UE assist reguired, cues for light touch.          MODALITIES                       Other Therapeutic Activities/Education:    Home Exercise Program:  Issued, practiced and pt demo ability to perform on eval date  5/7: RTB given   Access Code: [email or web address]   URL: [email or web address]   Date: 03/17/2023   Prepared by: Clint Bolder, PT, DPT, Cert. DN   Exercises   - Seated Long Arc Quad  - 2 x daily - 7 x weekly - 2 sets - 10 reps - 3 hold   - Seated March  - 2 x daily - 7 x weekly - 2 sets - 10 reps   - Sit to Stand with Armchair  - 2 x daily - 7 x weekly - 2 sets - 10 reps   - Seated Hip Abduction with  Resistance  - 2 x daily - 7 x weekly - 2 sets - 10 reps   - Seated Hip Adduction Isometrics with Ball  - 2 x daily - 7 x weekly - 2 sets - 10 reps - 5 hold     Access Code: [email or web address]  URL: [email or web address]  Date: 03/25/2023  Prepared by: Laroy Apple  Exercises  - Supine Bridge  - 1 x daily - 7 x weekly - 3 sets - 10 reps  - Supine Straight Leg Raises  - 1 x daily - 7 x weekly - 3 sets - 10 reps  - Supine Lower Trunk Rotation  - 1 x daily - 7 x weekly - 3 sets - 10 reps    5/22: Access Code: [email or web address]  URL: [email or web address]  Date: 04/01/2023  Prepared by: Laroy Apple  Exercises  - Standing Hip Abduction with Counter Support  - 1 x daily - 7 x weekly - 3 sets - 10 reps - 3 hold  - Standing Hip Extension with Counter Support  - 1 x daily - 7 x weekly - 3 sets - 10 reps - 3  hold  - Standing March with Counter Support  - 1 x daily - 7 x weekly - 3 sets - 10 reps  - Seated Hamstring Stretch with Chair  - 1 x daily - 7 x weekly - 3 sets - 10 reps - 1 min hold    Manual Treatments:  none    Modalities:  none    Communication with other providers: none this date    Assessment:  Pt tolerated treatment progression with equipment with reports of hamstring pain and overall significant BLE fatigue. 3 seated rest breaks required.  Pt would benefit from continued skilled physical therapy to address remaining impairments and limitations, progress toward goal completion, promote independence with ADLs, and prevent further injury. end pain: same       Plan for Next Session:  BLE strength, STS, gait, balance, proprioception, stair negotiation    Time In / Time Out:  [phone] = 53    Timed Code/Total Treatment Minutes:    53/53 = 3 te, 1 neuro    Next Progress Note due:  10th visit     Plan of Care Interventions:  [x]  Therapeutic Exercise  []  Modalities:  [x]  Therapeutic Activity     []  Ultrasound  []  Estim  [x]  Gait Training      []  Cervical Traction []  Lumbar Traction  [x]  Neuromuscular Re-education    []  Cold/hotpack []  Iontophoresis   [x]  Instruction in HEP      []  Vasopneumatic   [x]  Dry Needling    [x]  Manual Therapy               []  Aquatic Therapy            Electronically signed by: Michele Kerlin L. Clovis Riley PTA [id number]            04/08/2023, 8:44 AM

## 2023-04-10 ENCOUNTER — Inpatient Hospital Stay: Admit: 2023-04-10 | Payer: MEDICAID | Primary: Internal Medicine

## 2023-04-10 NOTE — Other (Signed)
Outpatient Physical Therapy  [place]           [x]  Phone: [phone]   Fax: [phone]  Joe Avery           []  Phone: [phone]   Fax: [phone]        Physical Therapy Daily Treatment Note  Date:  04/10/2023    Patient Name:  Joe Avery    DOB:  23-Sep-1961  MRN: [id number]  Restrictions/Precautions: Restrictions/Precautions: Fall Risk  Diagnosis:   Idiopathic peripheral neuropathy [G60.9]    Date of Injury/Surgery:   Treatment Diagnosis:  decreased BLE strength, STS and gait impairments, tremors  Insurance/Certification information: [employer]  Referring Physician:  Primus Bravo, DO     PCP: Rande Brunt, MD  Next Doctor Visit:    Plan of care signed (Y/N):  Yes  Outcome Measure:   ABC: 44%  5x STS: 33.42 sec   TUG: 20.36 sec     Visit# / total visits:  8 /12    Pain level: sore /10    Goals:     Patient goals: get back to normal  Short term goals  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 Pt requires increased time for completion of activities. he was diagnosed with IPN two years ago and has been having memory issues since then. STS demo shaking of BLE and bilateral knee flexion. pt amb without AD, with bilateral knees flexed and looking down at foot placement with trunk flexed, wide BOS. Pt does demo some tremors of BLE and has some short term memory impairments. Pt lives in the basement and has  to negotiate stairs everyday at home. He does not use an AD, although it was suggested to him recently. He has underwent many tests (not in [facility] system) but has not had a brain MRI. He has an EMG scheduled for August.  Pt demo s/s of a neurodegenerative disorder but has not been tested regarding those. He has to sit down to get dressed so that he doesnt fall. Pt demo impaired BLE strength, STS, gait, balance, proprioception, activity tolerance, stair negotiation, adls and increased pain. Pt would benefit from continued skilled physical therapy to address impairments and limitations, progress toward goal completion, promote independence with ADLs, and prevent further injury.      Subjective: pt reports that he is a little sore today.       Any changes in Ambulatory Summary Sheet?  None      Objective:    Pt amb without AD, increased time for completion secondary to decreased cadence and step length bilaterally   Fatigues with STS with more reps completed   LOB posterior on wobbleboard requiring PT mod assist     Exercises: (No more than 4 columns)  Exercise/Equipment 04/03/23 #6  04-08-23 04/10/23 #8            WARM UP      Nustep      S10/A9 L4 5'  L5  x 5 min L5 5'          TABLE      STS X10 from 23" no UE   X10 from 22" no UE at beginning   X10 from 22" no UE at end   LAQ      Seated marches      Seated clam       Seated ball squeeze       bridges      SLR       Trunk rotations            STANDING      Step up ant 6" x5 ea BUE assist  6" step, BIL rail assist x 10 ea 6" x10 ea BUE assist    HR/TR      Marching X10 alt ea BUE assist  X10 alt ea BUE assist X10 alt ea BUE assist    Hip abd/ext  X10 ea bilat BUE assist  X10 ea bilat BUE assis Hip 3 way x10 ea bilat   BUE assist    Standing hamstring stretch  Foot propped on step x 1 min ea    Leg press seat #5  55 # x 10   44 # x 10         Squats   In p// bars with focus on tech x 10 reps light UE    PROPRIOCEPTION      NBOS   X30" EO floor   X30" EC floor    tandem   In p// bars , alternating ea foot in lead x 5 reps with 3 sec hold. Intermittent UE assist.  X30" ea EO floor   X30" ea EC floor    Marches on foam       Cone taps   Light taps onto BOSU in p// bars. UE assist reguired, cues for light touch.    Wobbleboard    1' ea AP, ML    MODALITIES                    Other Therapeutic Activities/Education:    Home Exercise Program:  Issued, practiced and pt demo ability to perform on eval date  5/7: RTB given   Access Code: [email or web address]   URL: [email or web address]   Date: 03/17/2023   Prepared by: Clint Bolder, PT, DPT, Cert. DN   Exercises   - Seated Long Arc Quad  - 2 x daily - 7 x weekly - 2 sets - 10 reps - 3 hold   - Seated March  - 2 x daily - 7 x weekly - 2 sets - 10 reps   - Sit to Stand with Armchair  - 2 x daily - 7 x weekly - 2 sets - 10 reps   - Seated Hip Abduction with Resistance  - 2 x daily - 7 x weekly - 2 sets - 10 reps   - Seated Hip Adduction Isometrics with Ball  - 2 x daily - 7 x weekly - 2 sets - 10 reps - 5 hold     Access Code: [email or web address]  URL: [email or web address]  Date: 03/25/2023  Prepared by: Laroy Apple  Exercises  - Supine Bridge  - 1 x daily - 7 x weekly - 3  sets - 10 reps  - Supine Straight Leg Raises  - 1 x daily - 7 x weekly - 3 sets - 10 reps  - Supine Lower Trunk Rotation  - 1 x daily - 7 x weekly - 3 sets - 10 reps    5/22: Access Code: [email or web address]  URL: [email or web address]  Date: 04/01/2023  Prepared by: Laroy Apple  Exercises  - Standing Hip Abduction with Counter Support  - 1 x daily - 7 x weekly - 3 sets - 10 reps - 3 hold  - Standing Hip Extension with Counter Support  - 1 x daily - 7 x weekly - 3 sets - 10 reps - 3 hold  - Standing March with Counter Support  - 1 x daily - 7 x weekly - 3 sets - 10 reps  - Seated Hamstring Stretch with Chair  - 1 x daily - 7 x weekly - 3 sets - 10 reps - 1 min hold    Manual Treatments:  none    Modalities:  none    Communication with other providers: none this date    Assessment:  Pt  tolerated treatment well today. Joe Avery did well today with added activities and balance activities.  Pt would benefit from continued skilled physical therapy to address remaining impairments and limitations, progress toward goal completion, promote independence with ADLs, and prevent further injury. end pain: 0/10        Plan for Next Session:  BLE strength, STS, gait, balance, proprioception, stair negotiation    Time In / Time Out: 1115/1153    Timed Code/Total Treatment Minutes:   38/38: 1 TE 1 TA 1 NR     Next Progress Note due:  10th visit     Plan of Care Interventions:  [x]  Therapeutic Exercise  []  Modalities:  [x]  Therapeutic Activity     []  Ultrasound  []  Estim  [x]  Gait Training      []  Cervical Traction []  Lumbar Traction  [x]  Neuromuscular Re-education    []  Cold/hotpack []  Iontophoresis   [x]  Instruction in HEP      []  Vasopneumatic   [x]  Dry Needling    [x]  Manual Therapy               []  Aquatic Therapy            Electronically signed by: Evert Kohl, PT, DPT, Cert. DN              04/10/2023, 7:14 AM

## 2023-04-15 ENCOUNTER — Inpatient Hospital Stay: Admit: 2023-04-15 | Payer: MEDICAID | Primary: Internal Medicine

## 2023-04-15 DIAGNOSIS — G609 Hereditary and idiopathic neuropathy, unspecified: Secondary | ICD-10-CM

## 2023-04-15 NOTE — Other (Signed)
Outpatient Physical Therapy  [place]           [x]  Phone: [phone]   Fax: [phone]  [name]           []  Phone: [phone]   Fax: [phone]        Physical Therapy Daily Treatment Note  Date:  04/15/2023    Patient Name:  Joe Avery    DOB:  December 18, 1960  MRN: [id number]  Restrictions/Precautions: Restrictions/Precautions: Fall Risk  Diagnosis:   Idiopathic peripheral neuropathy [G60.9]    Date of Injury/Surgery:   Treatment Diagnosis:  decreased BLE strength, STS and gait impairments, tremors  Insurance/Certification information: [employer]  Referring Physician:  Primus Bravo, DO     PCP: Rande Brunt, MD  Next Doctor Visit:    Plan of care signed (Y/N):  Yes  Outcome Measure:   ABC: 44%  5x STS: 33.42 sec   TUG: 20.36 sec     Visit# / total visits:  9 /12    Pain level:  0/10    Goals:     Patient goals: get back to normal  Short term goals  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 Pt requires increased time for completion of activities. he was diagnosed with IPN two years ago and has been having memory issues since then. STS demo shaking of BLE and bilateral knee flexion. pt amb without AD, with bilateral knees flexed and looking down at foot placement with trunk flexed, wide BOS. Pt does demo some tremors of BLE and has some short term memory impairments. Pt lives in the basement and has to  negotiate stairs everyday at home. He does not use an AD, although it was suggested to him recently. He has underwent many tests (not in [facility] system) but has not had a brain MRI. He has an EMG scheduled for August.  Pt demo s/s of a neurodegenerative disorder but has not been tested regarding those. He has to sit down to get dressed so that he doesnt fall. Pt demo impaired BLE strength, STS, gait, balance, proprioception, activity tolerance, stair negotiation, adls and increased pain. Pt would benefit from continued skilled physical therapy to address impairments and limitations, progress toward goal completion, promote independence with ADLs, and prevent further injury.      Subjective: pt reports that he is doing well today.       Any changes in Ambulatory Summary Sheet?  None      Objective:    Minor LOB with AP wobbleboard requiring PT assist     Exercises: (No more than 4 columns)   Exercise/Equipment 04-08-23 04/10/23 #8  04/15/23 #9            WARM UP      Nustep  S10/A9 L5  x 5 min L5 5'  L5 5'          TABLE      STS  X10 from 22" no UE at beginning   X10 from 22" no UE at end X10 from 22" no UE at beginning   X10 from 22" no UE at end    LAQ      Seated marches      Seated clam       Seated ball squeeze       bridges      SLR       Trunk rotations            STANDING      Step up ant 6" step, BIL rail assist x 10 ea 6" x10 ea BUE assist  6" x10 ea BUE assist    HR/TR      Marching X10 alt ea BUE assist X10 alt ea BUE assist  X10 alt ea BUE assist   Add weight next    Hip abd/ext  X10 ea bilat BUE assis Hip 3 way x10 ea bilat   BUE assist  Hip 3 way x10 ea bilat BUE assist   Add weight next    Standing hamstring stretch Foot propped on step x 1 min ea     Leg press seat #5 55 # x 10   44 # x 10     Squats  In p// bars with focus on tech x 10 reps light UE     PROPRIOCEPTION      NBOS  X30" EO floor   X30" EC floor  X30" EO floor   X30" EC floor    tandem In p// bars , alternating ea foot in lead x 5 reps  with 3 sec hold. Intermittent UE assist.  X30" ea EO floor   X30" ea EC floor  X30" ea EO floor  X30" ea EC floor       Foam next   Marches on foam       Cone taps  Light taps onto BOSU in p// bars. UE assist reguired, cues for light touch.     Wobbleboard   1' ea AP, ML  1' ea AP, ML          MODALITIES                    Other Therapeutic Activities/Education:    Home Exercise Program:  Issued, practiced and pt demo ability to perform on eval date  5/7: RTB given   Access Code: [email or web address]   URL: [email or web address]   Date: 03/17/2023   Prepared by: Clint Bolder, PT, DPT, Cert. DN   Exercises   - Seated Long Arc Quad  - 2 x daily - 7 x weekly - 2 sets - 10 reps - 3 hold   - Seated March  - 2 x daily - 7 x weekly - 2 sets - 10 reps   - Sit to Stand with Armchair  - 2 x daily - 7 x weekly - 2 sets - 10 reps   - Seated Hip Abduction with Resistance  - 2 x daily - 7 x weekly - 2 sets - 10 reps   - Seated Hip Adduction Isometrics with Ball  - 2 x daily - 7 x weekly - 2 sets - 10 reps - 5 hold     Access Code: [email or web address]  URL: [email or web address]  Date: 03/25/2023  Prepared by: Laroy Apple  Exercises  - Supine Bridge  - 1 x daily - 7 x weekly - 3 sets - 10 reps  - Supine Straight Leg Raises  - 1 x daily - 7 x weekly - 3 sets - 10 reps  - Supine Lower Trunk Rotation  - 1 x daily - 7 x weekly - 3 sets - 10 reps    5/22: Access Code: [email or web address]  URL: [email or web address]  Date: 04/01/2023  Prepared by: Laroy Apple  Exercises  - Standing Hip Abduction with Counter Support  - 1 x daily - 7 x weekly - 3 sets - 10 reps - 3 hold  - Standing Hip Extension with Counter Support  - 1 x daily - 7 x weekly - 3 sets - 10 reps - 3 hold  - Standing March with Counter Support  - 1 x daily - 7 x weekly - 3 sets - 10 reps  - Seated Hamstring Stretch with Chair  - 1 x daily - 7 x weekly - 3 sets - 10 reps - 1 min hold    Manual Treatments:  none    Modalities:  none    Communication with other providers: none this  date    Assessment: Pt tolerated treatment well today. Pt has been doing well with balance activities, plan to progress to foam next visit.  Pt would benefit from continued skilled physical therapy to address remaining impairments and limitations, progress toward goal completion, promote independence with ADLs, and prevent further injury. end pain: 0/10          Plan for Next Session:  BLE strength, STS, gait, balance, proprioception, stair negotiation    Time In / Time Out: 1027/1105    Timed Code/Total Treatment Minutes:   38/38: 1 TE 1 TA 1 NR    Next Progress Note due:  10th visit     Plan of Care Interventions:  [x]  Therapeutic Exercise  []  Modalities:  [x]  Therapeutic Activity     []  Ultrasound  []  Estim  [x]  Gait Training      []  Cervical Traction []  Lumbar Traction  [x]  Neuromuscular Re-education    []  Cold/hotpack []  Iontophoresis   [x]  Instruction in HEP      []  Vasopneumatic   [x]  Dry Needling    [x]  Manual Therapy               []  Aquatic Therapy            Electronically signed by: Evert Kohl, PT, DPT, Cert. DN              04/15/2023, 8:34 AM

## 2023-04-17 ENCOUNTER — Inpatient Hospital Stay: Admit: 2023-04-17 | Payer: MEDICAID | Primary: Internal Medicine

## 2023-04-17 NOTE — Progress Notes (Signed)
Outpatient Physical Therapy           [place]           [x]  Phone: [phone]   Fax: [phone]  [name]           []  Phone: [phone]   Fax: [phone]      To: Primus Bravo, DO     From: Evert Kohl, PT, DPT, Cert. DN      Patient: Joe Avery                    DOB: [birth year]  Diagnosis:  Idiopathic peripheral neuropathy [G60.9]        Treatment Diagnosis: decreased BLE strength, STS and gait impairments, tremors       Date: 04/17/2023  [x]   Progress Note                []   Discharge Note    Evaluation Date:  03/17/23   Total Visits to date:  10  Cancels/No-shows to date:  0    Subjective:       he notes that he is feeling like he is progressing with returning back to his PLOF. He notes that mentally he is there but physically he struggles sometimes. He feels like he is on a roller coaster going up and down. He notes that with HEP at home he starts out good but halfway through he starts to struggle. He would like to get going with therapy. ABC: 52%         Objective/Significant Findings At Last Visit/Comments:    5x STS: 39.30 sec   TUG: 29.81 sec     pt amb with forward flexed posture with step to pattern, pausing with STS and with each step as feeling off balance today   L knee extension strength: 4+/5     Assessment:     Pt tolerated treatment well today. He has progressed in some things and has declined in others, he is having an off day today. He feels that physically his body is holding him back, he feels like his mind is there.  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 Term Goals  Time Frame for Long Term Goals: 12  visits  Pt will report overall improvement in condition by 50% or more met 5/18  pt will improve ABC to 53% or more to show MDC and subjective improvement almost met 6/7  pt will improve TUG to 15 sec or less for improved ambulation in home not met 6/7  pt will improve 5x STS to 25 sec or less for improved getting out of chairs not met 6/7  pt will improve L knee extension strength to 4-/5 for improved strength in stance phase of gait met 6/7           Patient Status: []  Continue per initial plan of Care     []  Patient now discharged     [x]  Additional visits requested, Please re-certify for additional visits:      Requested frequency/duration:  2 /week for 4 weeks    If we are requesting  more visits, we fully anticipate the patient's condition is expected to improve within the treatment timeframe we are requesting.    Electronically signed by: Evert Kohl, PT, DPT, Cert. DN   , 04/17/2023, 8:02 AM    If you have any questions or concerns, please don't hesitate to call.  Thank you for your referral.    Physician Signature:______________________ Date:______ Time: ________  By signing above, therapist's plan is approved by physician

## 2023-04-17 NOTE — Other (Signed)
Outpatient Physical Therapy  [place]           [x]  Phone: [phone]   Fax: [phone]  [name]           []  Phone: [phone]   Fax: [phone]        Physical Therapy Daily Treatment Note  Date:  04/17/2023    Patient Name:  Joe Avery    DOB:  [birth year]  MRN: [id number]  Restrictions/Precautions: Restrictions/Precautions: Fall Risk  Diagnosis:   Idiopathic peripheral neuropathy [G60.9]    Date of Injury/Surgery:   Treatment Diagnosis:  decreased BLE strength, STS and gait impairments, tremors  Insurance/Certification information: [employer]- 30 PCY   Referring Physician:  Primus Bravo, DO     PCP: Rande Brunt, MD  Next Doctor Visit:    Plan of care signed (Y/N):  Yes  Outcome Measure:   ABC: 52%  5x STS: 39.30 sec   TUG: 29.81 sec     Visit# / total visits:  10 /20    Pain level:  0/10    Goals:     Patient goals: get back to normal progressing 6/7  Short term goals  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     Long Term Goals  Time Frame for Long Term Goals: 12 visits  Pt will report overall improvement in condition by 50% or more met 5/18  pt will improve ABC to 53% or more to show MDC and subjective improvement almost met 6/7  pt will improve TUG to 15 sec or less for improved ambulation in home not met 6/7  pt will improve 5x STS to 25 sec or less for improved getting out of chairs not met 6/7  pt will improve L knee extension strength to 4-/5 for improved strength in stance phase of gait met 6/7      Summary of Evaluation:  Assessment: Pt is a 62 yo male that presents to therapy with complaints of BLE pain and fatigue. Pt requires increased time for completion of activities. he was diagnosed with IPN two years ago and has been having memory issues since then. STS demo shaking of BLE and bilateral knee flexion. pt amb without AD, with bilateral knees flexed and looking down at foot placement with trunk flexed, wide BOS. Pt does demo some tremors of BLE and has  some short term memory impairments. Pt lives in the basement and has to negotiate stairs everyday at home. He does not use an AD, although it was suggested to him recently. He has underwent many tests (not in [place] system) but has not had a brain MRI. He has an EMG scheduled for August.  Pt demo s/s of a neurodegenerative disorder but has not been tested regarding those. He has to sit down to get dressed so that he doesnt fall. Pt demo impaired BLE strength, STS, gait, balance, proprioception, activity tolerance, stair negotiation, adls and increased pain. Pt would benefit from continued skilled physical therapy to address impairments and limitations, progress toward goal completion, promote independence with ADLs, and prevent further injury.      Subjective: he notes that he is feeling like he is progressing with returning back to his PLOF. He notes that mentally he is there but physically he struggles sometimes. He feels like he is on a roller coaster going up and down. He notes that with HEP at home he starts out good but halfway through he starts to struggle. He would like to  get going with therapy. ABC: 52%       Any changes in Ambulatory Summary Sheet?  None      Objective:    5x STS: 39.30 sec   TUG: 29.81 sec     pt amb with forward flexed posture with step to pattern, pausing with STS and with each step as feeling off balance today   L knee extension strength: 4+/5     Exercises: (No more than 4 columns)   Exercise/Equipment 04/10/23 #8  04/15/23 #9  04/17/23 #10            WARM UP      Nustep      S10/A9 L5 5'  L5 5'  L5 5'          TABLE      STS X10 from 22" no UE at beginning   X10 from 22" no UE at end X10 from 22" no UE at beginning   X10 from 22" no UE at end  10x2 from 22"    LAQ      Seated marches      Seated clam       Seated ball squeeze       bridges      SLR       Trunk rotations            STANDING      Step up ant 6" x10 ea BUE assist  6" x10 ea BUE assist     HR/TR      Marching X10 alt ea BUE  assist  X10 alt ea BUE assist   Add weight next  1# x10 alt ea    Hip abd/ext  Hip 3 way x10 ea bilat   BUE assist  Hip 3 way x10 ea bilat BUE assist   Add weight next  Hip 3 way 1# x10 ea bilat    PROPRIOCEPTION      NBOS X30" EO floor   X30" EC floor  X30" EO floor   X30" EC floor     tandem X30" ea EO floor   X30" ea EC floor  X30" ea EO floor  X30" ea EC floor       Foam next    Marches on foam       Cone taps       Wobbleboard  1' ea AP, ML  1' ea AP, ML           MODALITIES                    Other Therapeutic Activities/Education:    Home Exercise Program:  Issued, practiced and pt demo ability to perform on eval date  5/7: RTB given   Access Code: [email or web address]   URL: [email or web address]   Date: 03/17/2023   Prepared by: Clint Bolder, PT, DPT, Cert. DN   Exercises   - Seated Long Arc Quad  - 2 x daily - 7 x weekly - 2 sets - 10 reps - 3 hold   - Seated March  - 2 x daily - 7 x weekly - 2 sets - 10 reps   - Sit to Stand with Armchair  - 2 x daily - 7 x weekly - 2 sets - 10 reps   - Seated Hip Abduction with Resistance  - 2 x daily - 7 x weekly - 2 sets - 10 reps   - Seated Hip Adduction Isometrics with [employer]  -  2 x daily - 7 x weekly - 2 sets - 10 reps - 5 hold     Access Code: [email or web address]  URL: [email or web address]  Date: 03/25/2023  Prepared by: Laroy Apple  Exercises  - Supine Bridge  - 1 x daily - 7 x weekly - 3 sets - 10 reps  - Supine Straight Leg Raises  - 1 x daily - 7 x weekly - 3 sets - 10 reps  - Supine Lower Trunk Rotation  - 1 x daily - 7 x weekly - 3 sets - 10 reps    5/22: Access Code: [email or web address]  URL: [email or web address]  Date: 04/01/2023  Prepared by: Laroy Apple  Exercises  - Standing Hip Abduction with Counter Support  - 1 x daily - 7 x weekly - 3 sets - 10 reps - 3 hold  - Standing Hip Extension with Counter Support  - 1 x daily - 7 x weekly - 3 sets - 10 reps - 3 hold  - Standing March with Counter Support  - 1 x daily - 7 x weekly - 3 sets - 10 reps  - Seated Hamstring  Stretch with Chair  - 1 x daily - 7 x weekly - 3 sets - 10 reps - 1 min hold    Manual Treatments:  none    Modalities:  none    Communication with other providers: PN sent     Assessment: Pt tolerated treatment well today. He has progressed in some things and has declined in others, he is having an off day today. He feels that physically his body is holding him back, he feels like his mind is there.  Pt would benefit from continued skilled physical therapy to address remaining impairments and limitations, progress toward goal completion, promote independence with ADLs, and prevent further injury. end pain: 0/10            Plan for Next Session:  BLE strength, STS, gait, balance, proprioception, stair negotiation    Time In / Time Out:1115/1154    Timed Code/Total Treatment Minutes:   39/39: 1 TA 2 TE     Next Progress Note due:     Plan of Care Interventions:  [x]  Therapeutic Exercise  []  Modalities:  [x]  Therapeutic Activity     []  Ultrasound  []  Estim  [x]  Gait Training      []  Cervical Traction []  Lumbar Traction  [x]  Neuromuscular Re-education    []  Cold/hotpack []  Iontophoresis   [x]  Instruction in HEP      []  Vasopneumatic   [x]  Dry Needling    [x]  Manual Therapy               []  Aquatic Therapy            Electronically signed by: Evert Kohl, PT, DPT, Cert. DN              04/17/2023, 8:01 AM

## 2023-04-22 ENCOUNTER — Inpatient Hospital Stay: Admit: 2023-04-22 | Payer: MEDICAID | Primary: Internal Medicine

## 2023-04-22 NOTE — Other (Signed)
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   Referring Physician:  Primus Bravo, DO     PCP: Rande Brunt, MD  Next Doctor Visit:    Plan of care signed (Y/N):  Yes  Outcome Measure:   ABC: 52%  5x STS: 39.30 sec   TUG: 29.81 sec     Visit# / total visits:  11 /20    Pain level:  0/10    Goals:     Patient goals: get back to normal progressing 6/7  Short term goals  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     Long Term Goals  Time Frame for Long Term Goals: 12 visits  Pt will report overall improvement in condition by 50% or more met 5/18  pt will improve ABC to 53% or more to show MDC and subjective improvement almost met 6/7  pt will improve TUG to 15 sec or less for improved ambulation in home not met 6/7  pt will improve 5x STS to 25 sec or less for improved getting out of chairs not met 6/7  pt will improve L knee extension strength to 4-/5 for improved strength in stance phase of gait met 6/7      Summary of Evaluation:  Assessment: Pt is a 62 yo male that presents to therapy with complaints of BLE pain and fatigue. Pt requires increased time for completion of activities. he was diagnosed with IPN two years ago and has been having memory issues since then. STS demo shaking of BLE and bilateral knee flexion. pt amb without AD, with bilateral knees flexed and looking down at foot placement with trunk flexed, wide BOS. Pt does demo some tremors of BLE and has  some short term memory impairments. Pt lives in the basement and has to negotiate stairs everyday at home. He does not use an AD, although it was suggested to him recently. He has underwent many tests (not in [place] system) but has not had a brain MRI. He has an EMG scheduled for August.  Pt demo s/s of a neurodegenerative disorder but has not been tested regarding those. He has to sit down to get dressed so that he doesnt fall. Pt demo impaired BLE strength, STS, gait, balance, proprioception, activity tolerance, stair negotiation, adls and increased pain. Pt would benefit from continued skilled physical therapy to address impairments and limitations, progress toward goal completion, promote independence with ADLs, and prevent further injury.      Subjective: pt reports that he is doing okay today.       Any changes in Ambulatory Summary Sheet?  None      Objective:    Able to STS from 21" without use of hands, BLE quivering with both concentric and eccentric phases of STS   He was able to complete step ups with UE PRN, was  a little more off balance but pt was able to self correct, PT SBA   Hesitant with cone taps with feet and cone reaches with hands on foam but pt able to perform without LOB     Exercises: (No more than 4 columns)   Exercise/Equipment 04/15/23 #9  04/17/23 #10  04/22/23 #11            WARM UP      Nustep      S10/A9 L5 5'  L5 5'  L5 5'          TABLE      STS X10 from 22" no UE at beginning   X10 from 22" no UE at end  10x2 from 22"  10x2 from 21" no UE    LAQ      Seated marches      Seated clam       Seated ball squeeze       bridges      SLR       Trunk rotations            STANDING      Step up ant 6" x10 ea BUE assist   6" x10 ea UE PRN    HR/TR      Marching X10 alt ea BUE assist   Add weight next  1# x10 alt ea  1# x10 alt ea    Hip abd/ext  Hip 3 way x10 ea bilat BUE assist   Add weight next  Hip 3 way 1# x10 ea bilat  Hip 3 way 1# x10 ea bilat    PROPRIOCEPTION      NBOS X30" EO floor   X30"  EC floor      tandem X30" ea EO floor  X30" ea EC floor       Foam next     Marches on foam       Cone taps feet    9" cone X10 ea foam   Cone reaches hands    9" cone X5 ea foam    Wobbleboard  1' ea AP, ML   1' ea AP, ML UE PRN          MODALITIES                    Other Therapeutic Activities/Education:    Home Exercise Program:  Issued, practiced and pt demo ability to perform on eval date  5/7: RTB given   Access Code: [email or web address]   URL: [email or web address]   Date: 03/17/2023   Prepared by: Clint Bolder, PT, DPT, Cert. DN   Exercises   - Seated Long Arc Quad  - 2 x daily - 7 x weekly - 2 sets - 10 reps - 3 hold   - Seated March  - 2 x daily - 7 x weekly - 2 sets - 10 reps   - Sit to Stand with Armchair  - 2 x daily - 7 x weekly - 2 sets - 10 reps   - Seated Hip Abduction with Resistance  - 2 x daily - 7 x weekly - 2 sets - 10 reps   - Seated Hip Adduction Isometrics with Ball  - 2 x daily - 7 x weekly - 2 sets - 10 reps - 5 hold     Access Code: [email or web address]  URL: [email or web address]  Date: 03/25/2023  Prepared by: Laroy Apple  Exercises  - Supine Bridge  - 1 x daily -  7 x weekly - 3 sets - 10 reps  - Supine Straight Leg Raises  - 1 x daily - 7 x weekly - 3 sets - 10 reps  - Supine Lower Trunk Rotation  - 1 x daily - 7 x weekly - 3 sets - 10 reps    5/22: Access Code: [email or web address]  URL: [email or web address]  Date: 04/01/2023  Prepared by: Laroy Apple  Exercises  - Standing Hip Abduction with Counter Support  - 1 x daily - 7 x weekly - 3 sets - 10 reps - 3 hold  - Standing Hip Extension with Counter Support  - 1 x daily - 7 x weekly - 3 sets - 10 reps - 3 hold  - Standing March with Counter Support  - 1 x daily - 7 x weekly - 3 sets - 10 reps  - Seated Hamstring Stretch with Chair  - 1 x daily - 7 x weekly - 3 sets - 10 reps - 1 min hold    Manual Treatments:  none    Modalities:  none    Communication with other providers: none this date    Assessment: Pt tolerated treatment well today. [name]  did really well with the balance activities today, he was able to complete more challenging activities without any LOB.  Pt would benefit from continued skilled physical therapy to address remaining impairments and limitations, progress toward goal completion, promote independence with ADLs, and prevent further injury. end pain: 0/10              Plan for Next Session:  BLE strength, STS, gait, balance, proprioception, stair negotiation    Time In / Time Out: 1030/1108    Timed Code/Total Treatment Minutes:   38/38: 1 TE 1 TA 1 NR     Next Progress Note due:     Plan of Care Interventions:  [x]  Therapeutic Exercise  []  Modalities:  [x]  Therapeutic Activity     []  Ultrasound  []  Estim  [x]  Gait Training      []  Cervical Traction []  Lumbar Traction  [x]  Neuromuscular Re-education    []  Cold/hotpack []  Iontophoresis   [x]  Instruction in HEP      []  Vasopneumatic   [x]  Dry Needling    [x]  Manual Therapy               []  Aquatic Therapy            Electronically signed by: Evert Kohl, PT, DPT, Cert. DN              04/22/2023, 8:35 AM

## 2023-04-24 ENCOUNTER — Inpatient Hospital Stay: Admit: 2023-04-24 | Payer: MEDICAID | Primary: Internal Medicine

## 2023-04-24 NOTE — Other (Signed)
Outpatient Physical Therapy  [place]           [x]  Phone: [phone]   Fax: [phone]  [name]           []  Phone: [phone]   Fax: [phone]        Physical Therapy Daily Treatment Note  Date:  04/24/2023    Patient Name:  Joe Avery    DOB:  07-09-1961  MRN: [id number]  Restrictions/Precautions: Restrictions/Precautions: Fall Risk  Diagnosis:   Idiopathic peripheral neuropathy [G60.9]    Date of Injury/Surgery:   Treatment Diagnosis:  decreased BLE strength, STS and gait impairments, tremors  Insurance/Certification information: [employer]- 30 PCY   Referring Physician:  Primus Bravo, DO     PCP: Rande Brunt, MD  Next Doctor Visit:    Plan of care signed (Y/N):  Yes  Outcome Measure:   ABC: 52%  5x STS: 39.30 sec   TUG: 29.81 sec     Visit# / total visits:  12 /20    Pain level:  0/10    Goals:     Patient goals: get back to normal progressing 6/7  Short term goals  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     Long Term Goals  Time Frame for Long Term Goals: 12 visits  Pt will report overall improvement in condition by 50% or more met 5/18  pt will improve ABC to 53% or more to show MDC and subjective improvement almost met 6/7  pt will improve TUG to 15 sec or less for improved ambulation in home not met 6/7  pt will improve 5x STS to 25 sec or less for improved getting out of chairs not met 6/7  pt will improve L knee extension strength to 4-/5 for improved strength in stance phase of gait met 6/7      Summary of Evaluation:  Assessment: Pt is a 62 yo male that presents to therapy with complaints of BLE pain and fatigue. Pt requires increased time for completion of activities. he was diagnosed with IPN two years ago and has been having memory issues since then. STS demo shaking of BLE and bilateral knee flexion. pt amb without AD, with bilateral knees flexed and looking down at foot placement with trunk flexed, wide BOS. Pt does demo some tremors of BLE and has  some short term memory impairments. Pt lives in the basement and has to negotiate stairs everyday at home. He does not use an AD, although it was suggested to him recently. He has underwent many tests (not in [place] system) but has not had a brain MRI. He has an EMG scheduled for August.  Pt demo s/s of a neurodegenerative disorder but has not been tested regarding those. He has to sit down to get dressed so that he doesnt fall. Pt demo impaired BLE strength, STS, gait, balance, proprioception, activity tolerance, stair negotiation, adls and increased pain. Pt would benefit from continued skilled physical therapy to address impairments and limitations, progress toward goal completion, promote independence with ADLs, and prevent further injury.      Subjective: he reports no recent falls.       Any changes in Ambulatory Summary Sheet?  None      Objective:    Challenged with marches on foam today, demoing ankle instability and knee buckling but no LOB , PT CGA- min assist     Exercises: (No more than 4 columns)   Exercise/Equipment 04/17/23 #10  04/22/23 #11  04/24/23 #12            WARM UP      Nustep      S10/A9 L5 5'  L5 5'  L5 5'          TABLE      STS 10x2 from 22"  10x2 from 21" no UE  10x2 from 20" no UE    LAQ      Seated marches      Seated clam    RTB x10    Seated ball squeeze    10x5"    bridges      SLR       Trunk rotations            STANDING      Step up ant  6" x10 ea UE PRN  6" x10 ea UE PRN   HR/TR      Marching 1# x10 alt ea  1# x10 alt ea  1# x10 alt ea    Hip abd/ext  Hip 3 way 1# x10 ea bilat  Hip 3 way 1# x10 ea bilat  Hip 3 way 1# x10 ea bilat    PROPRIOCEPTION      NBOS      tandem      Marches on foam    X5 alt ea, BUE use    Cone taps feet   9" cone X10 ea foam    Cone reaches hands   9" cone X5 ea foam     Wobbleboard   1' ea AP, ML UE PRN           MODALITIES                    Other Therapeutic Activities/Education:    Home Exercise Program:  Issued, practiced and pt demo ability to perform  on eval date  5/7: RTB given   Access Code: [email or web address]   URL: [email or web address]   Date: 03/17/2023   Prepared by: Clint Bolder, PT, DPT, Cert. DN   Exercises   - Seated Long Arc Quad  - 2 x daily - 7 x weekly - 2 sets - 10 reps - 3 hold   - Seated March  - 2 x daily - 7 x weekly - 2 sets - 10 reps   - Sit to Stand with Armchair  - 2 x daily - 7 x weekly - 2 sets - 10 reps   - Seated Hip Abduction with Resistance  - 2 x daily - 7 x weekly - 2 sets - 10 reps   - Seated Hip Adduction Isometrics with Ball  - 2 x daily - 7 x weekly - 2 sets - 10 reps - 5 hold     Access Code: [email or web address]  URL: [email or web address]  Date: 03/25/2023  Prepared by: Laroy Apple  Exercises  - Supine Bridge  - 1 x daily - 7 x weekly - 3 sets - 10 reps  - Supine Straight Leg Raises  - 1 x daily - 7 x weekly - 3 sets - 10 reps  - Supine Lower Trunk Rotation  - 1 x daily - 7 x weekly - 3 sets - 10 reps    5/22: Access Code: [email or web address]  URL: [email or web address]  Date: 04/01/2023  Prepared by: Laroy Apple  Exercises  - Standing Hip Abduction with Counter Support  - 1 x daily - 7 x weekly - 3 sets - 10 reps -  3 hold  - Standing Hip Extension with Counter Support  - 1 x daily - 7 x weekly - 3 sets - 10 reps - 3 hold  - Standing March with Counter Support  - 1 x daily - 7 x weekly - 3 sets - 10 reps  - Seated Hamstring Stretch with Chair  - 1 x daily - 7 x weekly - 3 sets - 10 reps - 1 min hold    Manual Treatments:  none    Modalities:  none    Communication with other providers: none this date    Assessment: Pt tolerated treatment well today. He was challenged with marches on foam today, demoing some ankle instability and knee buckling. PT was CGA- min assist to prevent fall.  Pt would benefit from continued skilled physical therapy to address remaining impairments and limitations, progress toward goal completion, promote independence with ADLs, and prevent further injury. end pain: 0/10              Plan for Next Session:   BLE strength, STS, gait, balance, proprioception, stair negotiation    Time In / Time Out: 1113/1151    Timed Code/Total Treatment Minutes:   38/38: 2 TE 1 TA     Next Progress Note due: 20th visit     Plan of Care Interventions:  [x]  Therapeutic Exercise  []  Modalities:  [x]  Therapeutic Activity     []  Ultrasound  []  Estim  [x]  Gait Training      []  Cervical Traction []  Lumbar Traction  [x]  Neuromuscular Re-education    []  Cold/hotpack []  Iontophoresis   [x]  Instruction in HEP      []  Vasopneumatic   [x]  Dry Needling    [x]  Manual Therapy               []  Aquatic Therapy            Electronically signed by: Evert Kohl, PT, DPT, Cert. DN              04/24/2023, 7:38 AM

## 2023-04-29 ENCOUNTER — Inpatient Hospital Stay: Admit: 2023-04-29 | Payer: MEDICAID | Primary: Internal Medicine

## 2023-04-29 NOTE — Other (Signed)
Outpatient Physical Therapy  [place]           [x]  Phone: [phone]   Fax: [phone]  [name]           []  Phone: [phone]   Fax: [phone]        Physical Therapy Daily Treatment Note  Date:  04/29/2023    Patient Name:  Joe Avery    DOB:  02/12/1961  MRN: [id number]  Restrictions/Precautions: Restrictions/Precautions: Fall Risk  Diagnosis:   Idiopathic peripheral neuropathy [G60.9]    Date of Injury/Surgery:   Treatment Diagnosis:  decreased BLE strength, STS and gait impairments, tremors  Insurance/Certification information: [employer]- 30 PCY   Referring Physician:  Primus Bravo, DO     PCP: Rande Brunt, MD  Next Doctor Visit:    Plan of care signed (Y/N):  Yes  Outcome Measure:   ABC: 52%  5x STS: 39.30 sec   TUG: 29.81 sec     Visit# / total visits:  13 /20    Pain level:  0/10    Goals:     Patient goals: get back to normal progressing 6/7  Short term goals  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     Long Term Goals  Time Frame for Long Term Goals: 12 visits  Pt will report overall improvement in condition by 50% or more met 5/18  pt will improve ABC to 53% or more to show MDC and subjective improvement almost met 6/7  pt will improve TUG to 15 sec or less for improved ambulation in home not met 6/7  pt will improve 5x STS to 25 sec or less for improved getting out of chairs not met 6/7  pt will improve L knee extension strength to 4-/5 for improved strength in stance phase of gait met 6/7      Summary of Evaluation:  Assessment: Pt is a 62 yo male that presents to therapy with complaints of BLE pain and fatigue. Pt requires increased time for completion of activities. he was diagnosed with IPN two years ago and has been having memory issues since then. STS demo shaking of BLE and bilateral knee flexion. pt amb without AD, with bilateral knees flexed and looking down at foot placement with trunk flexed, wide BOS. Pt does demo some tremors of BLE and has  some short term memory impairments. Pt lives in the basement and has to negotiate stairs everyday at home. He does not use an AD, although it was suggested to him recently. He has underwent many tests (not in [facility] system) but has not had a brain MRI. He has an EMG scheduled for August.  Pt demo s/s of a neurodegenerative disorder but has not been tested regarding those. He has to sit down to get dressed so that he doesnt fall. Pt demo impaired BLE strength, STS, gait, balance, proprioception, activity tolerance, stair negotiation, adls and increased pain. Pt would benefit from continued skilled physical therapy to address impairments and limitations, progress toward goal completion, promote independence with ADLs, and prevent further injury.      Subjective: pt reports that he has not fallen since last visit.       Any changes in Ambulatory Summary Sheet?  None      Objective:    Challenged with ball squeezes secondary to adductor weakness     Exercises: (No more than 4 columns)   Exercise/Equipment 04/22/23 #11  04/24/23 #12  04/29/23 #13  WARM UP      Nustep      S10/A9 L5 5'  L5 5'  L5 5'          TABLE      STS 10x2 from 21" no UE  10x2 from 20" no UE  10x2 from 20" no UE    LAQ      Seated marches      Seated clam   RTB x10  RTB x15    Seated ball squeeze   10x5"  15x5"    bridges      SLR       Trunk rotations            STANDING      Step up ant 6" x10 ea UE PRN  6" x10 ea UE PRN 6" x10 ea UE PRN    HR/TR      Marching 1# x10 alt ea  1# x10 alt ea  1# x10 alt ea    Hip 3 way  Hip 3 way 1# x10 ea bilat  Hip 3 way 1# x10 ea bilat  1# x10 ea bilat    PROPRIOCEPTION      NBOS      tandem      Marches on foam   X5 alt ea, BUE use     Cone taps feet  9" cone X10 ea foam     Cone reaches hands  9" cone X5 ea foam      Wobbleboard  1' ea AP, ML UE PRN            MODALITIES                    Other Therapeutic Activities/Education:    Home Exercise Program:  Issued, practiced and pt demo ability to perform on  eval date  5/7: RTB given   Access Code: [email or web address]   URL: [email or web address]   Date: 03/17/2023   Prepared by: Clint Bolder, PT, DPT, Cert. DN   Exercises   - Seated Long Arc Quad  - 2 x daily - 7 x weekly - 2 sets - 10 reps - 3 hold   - Seated March  - 2 x daily - 7 x weekly - 2 sets - 10 reps   - Sit to Stand with Armchair  - 2 x daily - 7 x weekly - 2 sets - 10 reps   - Seated Hip Abduction with Resistance  - 2 x daily - 7 x weekly - 2 sets - 10 reps   - Seated Hip Adduction Isometrics with Ball  - 2 x daily - 7 x weekly - 2 sets - 10 reps - 5 hold     Access Code: [email or web address]  URL: [email or web address]  Date: 03/25/2023  Prepared by: Laroy Apple  Exercises  - Supine Bridge  - 1 x daily - 7 x weekly - 3 sets - 10 reps  - Supine Straight Leg Raises  - 1 x daily - 7 x weekly - 3 sets - 10 reps  - Supine Lower Trunk Rotation  - 1 x daily - 7 x weekly - 3 sets - 10 reps    5/22: Access Code: [email or web address]  URL: [email or web address]  Date: 04/01/2023  Prepared by: Laroy Apple  Exercises  - Standing Hip Abduction with Counter Support  - 1 x daily - 7 x weekly - 3 sets - 10 reps - 3 hold  - Standing Hip Extension with  Counter Support  - 1 x daily - 7 x weekly - 3 sets - 10 reps - 3 hold  - Standing March with Counter Support  - 1 x daily - 7 x weekly - 3 sets - 10 reps  - Seated Hamstring Stretch with Chair  - 1 x daily - 7 x weekly - 3 sets - 10 reps - 1 min hold    Manual Treatments:  none    Modalities:  none    Communication with other providers: none this date    Assessment: Pt tolerated treatment well today. He was able to increase reps on hip abd/add exercises today. Pt has not had any falls recently.  Pt would benefit from continued skilled physical therapy to address remaining impairments and limitations, progress toward goal completion, promote independence with ADLs, and prevent further injury. end pain: 0/10     Plan for Next Session:  BLE strength, STS, gait, balance, proprioception,  stair negotiation    Time In / Time Out: 1349/1427    Timed Code/Total Treatment Minutes:  38/38: 2 TE 1 TA     Next Progress Note due: 20th visit     Plan of Care Interventions:  [x]  Therapeutic Exercise  []  Modalities:  [x]  Therapeutic Activity     []  Ultrasound  []  Estim  [x]  Gait Training      []  Cervical Traction []  Lumbar Traction  [x]  Neuromuscular Re-education    []  Cold/hotpack []  Iontophoresis   [x]  Instruction in HEP      []  Vasopneumatic   [x]  Dry Needling    [x]  Manual Therapy               []  Aquatic Therapy            Electronically signed by: Evert Kohl, PT, DPT, Cert. DN              04/29/2023, 8:36 AM

## 2023-05-01 ENCOUNTER — Inpatient Hospital Stay: Admit: 2023-05-01 | Payer: MEDICAID | Primary: Internal Medicine

## 2023-05-01 NOTE — Other (Signed)
Outpatient Physical Therapy  [place]           [x]  Phone: [phone]   Fax: [phone]  [name]           []  Phone: [phone]   Fax: [phone]        Physical Therapy Daily Treatment Note  Date:  05/01/2023    Patient Name:  Joe Avery    DOB:  02-Apr-1961  MRN: [id number]  Restrictions/Precautions: Restrictions/Precautions: Fall Risk  Diagnosis:   Idiopathic peripheral neuropathy [G60.9]    Date of Injury/Surgery:   Treatment Diagnosis:  decreased BLE strength, STS and gait impairments, tremors  Insurance/Certification information: [employer]- 30 PCY   Referring Physician:  Primus Bravo, DO     PCP: Rande Brunt, MD  Next Doctor Visit:    Plan of care signed (Y/N):  Yes  Outcome Measure:   ABC: 52%  5x STS: 39.30 sec   TUG: 29.81 sec     Visit# / total visits:  14 /20    Pain level:  4/10    Goals:     Patient goals: get back to normal progressing 6/7  Short term goals  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     Long Term Goals  Time Frame for Long Term Goals: 12 visits  Pt will report overall improvement in condition by 50% or more met 5/18  pt will improve ABC to 53% or more to show MDC and subjective improvement almost met 6/7  pt will improve TUG to 15 sec or less for improved ambulation in home not met 6/7  pt will improve 5x STS to 25 sec or less for improved getting out of chairs not met 6/7  pt will improve L knee extension strength to 4-/5 for improved strength in stance phase of gait met 6/7      Summary of Evaluation:  Assessment: Pt is a 62 yo male that presents to therapy with complaints of BLE pain and fatigue. Pt requires increased time for completion of activities. he was diagnosed with IPN two years ago and has been having memory issues since then. STS demo shaking of BLE and bilateral knee flexion. pt amb without AD, with bilateral knees flexed and looking down at foot placement with trunk flexed, wide BOS. Pt does demo some tremors of BLE and has  some short term memory impairments. Pt lives in the basement and has to negotiate stairs everyday at home. He does not use an AD, although it was suggested to him recently. He has underwent many tests (not in [facility] system) but has not had a brain MRI. He has an EMG scheduled for August.  Pt demo s/s of a neurodegenerative disorder but has not been tested regarding those. He has to sit down to get dressed so that he doesnt fall. Pt demo impaired BLE strength, STS, gait, balance, proprioception, activity tolerance, stair negotiation, adls and increased pain. Pt would benefit from continued skilled physical therapy to address impairments and limitations, progress toward goal completion, promote independence with ADLs, and prevent further injury.      Subjective: Pt reports HEP is still challenging with his legs tightening after a while but pushes thru. No falls or LOB since last tx.      Any changes in Ambulatory Summary Sheet?  None      Objective:    Poor sitting and standing posture,   At arrival slow ambulation cadence stooped posture with no arm swing  and short step height and length  Reports stabbing feeling on left quad that come and goes on nustep.  Sup STS: slow mobility with STS and increased LE fatigue and more stooped position after 6 reps but able to push through with with some decrease in stability.   Challenged with ball squeezes secondary to adductor weakness     Exercises: (No more than 4 columns)   Exercise/Equipment 04/22/23 #11  04/24/23 #12  04/29/23 #13  05/01/23 #14            WARM UP       Nustep      S10/A9 L5 5'  L5 5'  L5 5'  L5 5'           TABLE       STS 10x2 from 21" no UE  10x2 from 20" no UE  10x2 from 20" no UE  10x2 from 20" no UE    LAQ       Seated marches       Seated clam   RTB x10  RTB x15  RTB x20    Seated ball squeeze   10x5"  15x5"     bridges       SLR        Trunk rotations              STANDING       Step up ant 6" x10 ea UE PRN  6" x10 ea UE PRN 6" x10 ea UE PRN  6" x10 ea UE  PRN    HR/TR       Marching 1# x10 alt ea  1# x10 alt ea  1# x10 alt ea  1# x10 alt ea   Hip 3 way  Hip 3 way 1# x10 ea bilat  Hip 3 way 1# x10 ea bilat  1# x10 ea bilat  1# x10 ea bilat    PROPRIOCEPTION       NBOS       tandem       Marches on foam   X5 alt ea, BUE use      Cone taps feet  9" cone X10 ea foam      Cone reaches hands  9" cone X5 ea foam       Wobbleboard  1' ea AP, ML UE PRN    declined          MODALITIES                       Other Therapeutic Activities/Education:    Home Exercise Program:  Issued, practiced and pt demo ability to perform on eval date  5/7: RTB given   Access Code: [email or web address]   URL: [email or web address]   Date: 03/17/2023   Prepared by: Clint Bolder, PT, DPT, Cert. DN   Exercises   - Seated Long Arc Quad  - 2 x daily - 7 x weekly - 2 sets - 10 reps - 3 hold   - Seated March  - 2 x daily - 7 x weekly - 2 sets - 10 reps   - Sit to Stand with Armchair  - 2 x daily - 7 x weekly - 2 sets - 10 reps   - Seated Hip Abduction with Resistance  - 2 x daily - 7 x weekly - 2 sets - 10 reps   - Seated Hip Adduction Isometrics with Ball  - 2 x daily - 7 x  weekly - 2 sets - 10 reps - 5 hold     Access Code: [email or web address]  URL: [email or web address]  Date: 03/25/2023  Prepared by: Laroy Apple  Exercises  - Supine Bridge  - 1 x daily - 7 x weekly - 3 sets - 10 reps  - Supine Straight Leg Raises  - 1 x daily - 7 x weekly - 3 sets - 10 reps  - Supine Lower Trunk Rotation  - 1 x daily - 7 x weekly - 3 sets - 10 reps    5/22: Access Code: [email or web address]  URL: [email or web address]  Date: 04/01/2023  Prepared by: Laroy Apple  Exercises  - Standing Hip Abduction with Counter Support  - 1 x daily - 7 x weekly - 3 sets - 10 reps - 3 hold  - Standing Hip Extension with Counter Support  - 1 x daily - 7 x weekly - 3 sets - 10 reps - 3 hold  - Standing March with Counter Support  - 1 x daily - 7 x weekly - 3 sets - 10 reps  - Seated Hamstring Stretch with Chair  - 1 x daily - 7 x weekly - 3 sets -  10 reps - 1 min hold    Manual Treatments:  none    Modalities:  none    Communication with other providers: none this date    Assessment: Pt tolerated treatment well today. Noted slow mobility and increase fatigue but able to perform all exercises without a significant break. No falls.  Pt would benefit from continued skilled physical therapy to address remaining impairments and limitations, progress toward goal completion, promote independence with ADLs, and prevent further injury.   end pain: 3/10     Plan for Next Session:  BLE strength, STS, gait, balance, proprioception, stair negotiation    Time In / Time Out: 0934/1016    Timed Code/Total Treatment Minutes:  42'/42': 2 TE 1 TA     Next Progress Note due: 20th visit     Plan of Care Interventions:  [x]  Therapeutic Exercise  []  Modalities:  [x]  Therapeutic Activity     []  Ultrasound  []  Estim  [x]  Gait Training      []  Cervical Traction []  Lumbar Traction  [x]  Neuromuscular Re-education    []  Cold/hotpack []  Iontophoresis   [x]  Instruction in HEP      []  Vasopneumatic   [x]  Dry Needling    [x]  Manual Therapy               []  Aquatic Therapy            Electronically signed by: [place] D Emanuele Mcwhirter, PTA, CLT 05/01/2023, 9:33 AM

## 2023-05-06 ENCOUNTER — Inpatient Hospital Stay: Admit: 2023-05-06 | Payer: MEDICAID | Primary: Internal Medicine

## 2023-05-06 NOTE — Other (Signed)
Outpatient Physical Therapy  [place]           [x]  Phone: [phone]   Fax: [phone]  Joe Avery           []  Phone: [phone]   Fax: [phone]        Physical Therapy Daily Treatment Note  Date:  05/06/2023    Patient Name:  Joe Avery    DOB:  May 02, 1961  MRN: [id number]  Restrictions/Precautions: Restrictions/Precautions: Fall Risk  Diagnosis:   Idiopathic peripheral neuropathy [G60.9]    Date of Injury/Surgery:   Treatment Diagnosis:  decreased BLE strength, STS and gait impairments, tremors  Insurance/Certification information: [employer]- 30 PCY   Referring Physician:  Primus Bravo, DO     PCP: Joe Brunt, MD  Next Doctor Visit:    Plan of care signed (Y/N):  Yes  Outcome Measure:   ABC: 52%  5x STS: 39.30 sec   TUG: 29.81 sec     Visit# / total visits:  15 /20    Pain level:  4/10    Goals:     Patient goals: get back to normal progressing 6/7  Short term goals  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     Long Term Goals  Time Frame for Long Term Goals: 12 visits  Pt will report overall improvement in condition by 50% or more met 5/18  pt will improve ABC to 53% or more to show MDC and subjective improvement almost met 6/7  pt will improve TUG to 15 sec or less for improved ambulation in home not met 6/7  pt will improve 5x STS to 25 sec or less for improved getting out of chairs not met 6/7  pt will improve L knee extension strength to 4-/5 for improved strength in stance phase of gait met 6/7      Summary of Evaluation:  Assessment: Pt is a 62 yo male that presents to therapy with complaints of BLE pain and fatigue. Pt requires increased time for completion of activities. he was diagnosed with IPN two years ago and has been having memory issues since then. STS demo shaking of BLE and bilateral knee flexion. pt amb without AD, with bilateral knees flexed and looking down at foot placement with trunk flexed, wide BOS. Pt does demo some tremors of BLE and has  some short term memory impairments. Pt lives in the basement and has to negotiate stairs everyday at home. He does not use an AD, although it was suggested to him recently. He has underwent many tests (not in [facility] system) but has not had a brain MRI. He has an EMG scheduled for August.  Pt demo s/s of a neurodegenerative disorder but has not been tested regarding those. He has to sit down to get dressed so that he doesnt fall. Pt demo impaired BLE strength, STS, gait, balance, proprioception, activity tolerance, stair negotiation, adls and increased pain. Pt would benefit from continued skilled physical therapy to address impairments and limitations, progress toward goal completion, promote independence with ADLs, and prevent further injury.      Subjective: pt reports that he was in severe pain over the weekend and is just doing alright today, is getting by. Notes that BLE are hurting somewhat.       Any changes in Ambulatory Summary Sheet?  None      Objective:    Challenged a little bit more with STS as it was a half inch lower  than last time       Exercises: (No more than 4 columns)   Exercise/Equipment 04/29/23 #13  05/01/23 #14 05/06/23 #15            WARM UP      Nustep      S10/A9 L5 5'  L5 5'  L5 5'          TABLE      STS 10x2 from 20" no UE  10x2 from 20" no UE  10x2 from 19.5" no UE    LAQ   10x3" ea   Seated marches      Seated clam  RTB x15  RTB x20  RTB x20    Seated ball squeeze  15x5"      bridges      SLR       Trunk rotations            STANDING      Step up ant 6" x10 ea UE PRN  6" x10 ea UE PRN     HR/TR      Marching 1# x10 alt ea  1# x10 alt ea 1# x10 alt ea    Hip 3 way  1# x10 ea bilat  1# x10 ea bilat  1# x10 ea bilat    PROPRIOCEPTION      NBOS      tandem      Marches on foam       Cone taps feet       Cone reaches hands       Wobbleboard   declined          MODALITIES                    Other Therapeutic Activities/Education:    Home Exercise Program:  Issued, practiced and pt demo ability  to perform on eval date  5/7: RTB given   Access Code: [email or web address]   URL: [email or web address]   Date: 03/17/2023   Prepared by: Joe Avery, PT, DPT, Cert. DN   Exercises   - Seated Long Arc Quad  - 2 x daily - 7 x weekly - 2 sets - 10 reps - 3 hold   - Seated March  - 2 x daily - 7 x weekly - 2 sets - 10 reps   - Sit to Stand with Armchair  - 2 x daily - 7 x weekly - 2 sets - 10 reps   - Seated Hip Abduction with Resistance  - 2 x daily - 7 x weekly - 2 sets - 10 reps   - Seated Hip Adduction Isometrics with Ball  - 2 x daily - 7 x weekly - 2 sets - 10 reps - 5 hold     Access Code: [email or web address]  URL: [email or web address]  Date: 03/25/2023  Prepared by: Joe Avery  Exercises  - Supine Bridge  - 1 x daily - 7 x weekly - 3 sets - 10 reps  - Supine Straight Leg Raises  - 1 x daily - 7 x weekly - 3 sets - 10 reps  - Supine Lower Trunk Rotation  - 1 x daily - 7 x weekly - 3 sets - 10 reps    5/22: Access Code: [email or web address]  URL: [email or web address]  Date: 04/01/2023  Prepared by: Joe Avery  Exercises  - Standing Hip Abduction with Counter Support  - 1 x daily - 7 x weekly - 3 sets - 10 reps - 3  hold  - Standing Hip Extension with Counter Support  - 1 x daily - 7 x weekly - 3 sets - 10 reps - 3 hold  - Standing March with Counter Support  - 1 x daily - 7 x weekly - 3 sets - 10 reps  - Seated Hamstring Stretch with Chair  - 1 x daily - 7 x weekly - 3 sets - 10 reps - 1 min hold    Manual Treatments:  none    Modalities:  none    Communication with other providers: none this date    Assessment: Pt tolerated treatment well today.  Joe Avery is challenged with STS, gait, balance and strengthening activities. He is able to complete activities in gym with increased time for completion. Pt would benefit from continued skilled physical therapy to address remaining impairments and limitations, progress toward goal completion, promote independence with ADLs, and prevent further injury. end pain: 4/10    Plan for  Next Session:  BLE strength, STS, gait, balance, proprioception, stair negotiation    Time In / Time Out: 1115/1153    Timed Code/Total Treatment Minutes: 38/38:  3 TE     Next Progress Note due: 20th visit     Plan of Care Interventions:  [x]  Therapeutic Exercise  []  Modalities:  [x]  Therapeutic Activity     []  Ultrasound  []  Estim  [x]  Gait Training      []  Cervical Traction []  Lumbar Traction  [x]  Neuromuscular Re-education    []  Cold/hotpack []  Iontophoresis   [x]  Instruction in HEP      []  Vasopneumatic   [x]  Dry Needling    [x]  Manual Therapy               []  Aquatic Therapy            Electronically signed by: Evert Kohl, PT, DPT, Cert. DN    05/06/2023, 8:42 AM

## 2023-05-08 ENCOUNTER — Inpatient Hospital Stay: Admit: 2023-05-08 | Discharge: 2023-05-08 | Payer: MEDICAID | Primary: Internal Medicine

## 2023-05-08 NOTE — Other (Cosign Needed)
Outpatient Physical Therapy  [place]           [x]  Phone: [phone]   Fax: [phone]  [name]           []  Phone: [phone]   Fax: [phone]        Physical Therapy Daily Treatment Note  Date:  05/08/2023    Patient Name:  Joe Avery    DOB:  [birth year]  MRN: [id number]  Restrictions/Precautions: Restrictions/Precautions: Fall Risk  Diagnosis:   Idiopathic peripheral neuropathy [G60.9]    Date of Injury/Surgery:   Treatment Diagnosis:  decreased BLE strength, STS and gait impairments, tremors  Insurance/Certification information: [employer]- 30 PCY   Referring Physician:  Primus Bravo, DO     PCP: Rande Brunt, MD  Next Doctor Visit:    Plan of care signed (Y/N):  Yes  Outcome Measure:   ABC: 52%  5x STS: 39.30 sec   TUG: 29.81 sec     Visit# / total visits:  16 /20    Pain level:  4/10 L anterior thigh     Goals:     Patient goals: get back to normal progressing 6/7  Short term goals  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     Long Term Goals  Time Frame for Long Term Goals: 12 visits  Pt will report overall improvement in condition by 50% or more met 5/18  pt will improve ABC to 53% or more to show MDC and subjective improvement almost met 6/7  pt will improve TUG to 15 sec or less for improved ambulation in home not met 6/7  pt will improve 5x STS to 25 sec or less for improved getting out of chairs not met 6/7  pt will improve L knee extension strength to 4-/5 for improved strength in stance phase of gait met 6/7      Summary of Evaluation:  Assessment: Pt is a 62 yo male that presents to therapy with complaints of BLE pain and fatigue. Pt requires increased time for completion of activities. he was diagnosed with IPN two years ago and has been having memory issues since then. STS demo shaking of BLE and bilateral knee flexion. pt amb without AD, with bilateral knees flexed and looking down at foot placement with trunk flexed, wide BOS. Pt does demo some  tremors of BLE and has some short term memory impairments. Pt lives in the basement and has to negotiate stairs everyday at home. He does not use an AD, although it was suggested to him recently. He has underwent many tests (not in [facility] system) but has not had a brain MRI. He has an EMG scheduled for August.  Pt demo s/s of a neurodegenerative disorder but has not been tested regarding those. He has to sit down to get dressed so that he doesnt fall. Pt demo impaired BLE strength, STS, gait, balance, proprioception, activity tolerance, stair negotiation, adls and increased pain. Pt would benefit from continued skilled physical therapy to address impairments and limitations, progress toward goal completion, promote independence with ADLs, and prevent further injury.      Subjective: Lisandro arrives to therapy stating that the L anterior thigh is sore.       Any changes in Ambulatory Summary Sheet?  None      Objective:    Slow and cautious with STS, occasional L knee buckling without fall but does require SBA.       Exercises: (No more  than 4 columns)   Exercise/Equipment 05/01/23 #14 05/06/23 #15  6/2/           WARM UP      Nustep      S10/A9 L5 5'  L5 5'  L5 5'         TABLE      STS 10x2 from 20" no UE  10x2 from 19.5" no UE  2*10 from 19.5" no UE    LAQ  10x3" ea 10*3" ea    Seated marches      Seated clam  RTB x20  RTB x20  RTB 30*   Seated ball squeeze       bridges      SLR       Trunk rotations            STANDING      Step up ant 6" x10 ea UE PRN      HR/TR      Marching 1# x10 alt ea 1# x10 alt ea  1# 10* ea   Hip 3 way  1# x10 ea bilat  1# x10 ea bilat  1# 10* ea bilat          PROPRIOCEPTION      NBOS      tandem      Marches on foam       Cone taps feet       Cone reaches hands       Wobbleboard  declined           MODALITIES                    Other Therapeutic Activities/Education:    Home Exercise Program:  Issued, practiced and pt demo ability to perform on eval date  5/7: RTB given   Access Code:  [email or web address]   URL: [email or web address]   Date: 03/17/2023   Prepared by: Clint Bolder, PT, DPT, Cert. DN   Exercises   - Seated Long Arc Quad  - 2 x daily - 7 x weekly - 2 sets - 10 reps - 3 hold   - Seated March  - 2 x daily - 7 x weekly - 2 sets - 10 reps   - Sit to Stand with Armchair  - 2 x daily - 7 x weekly - 2 sets - 10 reps   - Seated Hip Abduction with Resistance  - 2 x daily - 7 x weekly - 2 sets - 10 reps   - Seated Hip Adduction Isometrics with Ball  - 2 x daily - 7 x weekly - 2 sets - 10 reps - 5 hold     Access Code: [email or web address]  URL: [email or web address]  Date: 03/25/2023  Prepared by: Laroy Apple  Exercises  - Supine Bridge  - 1 x daily - 7 x weekly - 3 sets - 10 reps  - Supine Straight Leg Raises  - 1 x daily - 7 x weekly - 3 sets - 10 reps  - Supine Lower Trunk Rotation  - 1 x daily - 7 x weekly - 3 sets - 10 reps    5/22: Access Code: [email or web address]  URL: [email or web address]  Date: 04/01/2023  Prepared by: Laroy Apple  Exercises  - Standing Hip Abduction with Counter Support  - 1 x daily - 7 x weekly - 3 sets - 10 reps - 3 hold  - Standing Hip Extension with Counter Support  - 1 x daily - 7  x weekly - 3 sets - 10 reps - 3 hold  - Standing March with Counter Support  - 1 x daily - 7 x weekly - 3 sets - 10 reps  - Seated Hamstring Stretch with Chair  - 1 x daily - 7 x weekly - 3 sets - 10 reps - 1 min hold    Manual Treatments:  none    Modalities:  none    Communication with other providers: none this date    Assessment: Jimmi tolerated today's session well. He is slow and cautious with movement throughout the clinic. Weak and fatigues easily with exercises requiring frequent rest breaks. Difficulty controlling terminal knee extension with WB activities with frequent knee buckling bilaterally. End session pain: same     Plan for Next Session:  BLE strength, STS, gait, balance, proprioception, stair negotiation    Time In / Time Out:  1030/1110    Timed Code/Total Treatment Minutes:   77' 3 TE     Next Progress Note due: 20th visit     Plan of Care Interventions:  [x]  Therapeutic Exercise  []  Modalities:  [x]  Therapeutic Activity     []  Ultrasound  []  Estim  [x]  Gait Training      []  Cervical Traction []  Lumbar Traction  [x]  Neuromuscular Re-education    []  Cold/hotpack []  Iontophoresis   [x]  Instruction in HEP      []  Vasopneumatic   [x]  Dry Needling    [x]  Manual Therapy               []  Aquatic Therapy            Electronically signed by: Inda Merlin        05/08/2023, 8:44 AM

## 2023-05-11 ENCOUNTER — Inpatient Hospital Stay: Admit: 2023-05-11 | Payer: MEDICAID | Primary: Internal Medicine

## 2023-05-11 DIAGNOSIS — G609 Hereditary and idiopathic neuropathy, unspecified: Secondary | ICD-10-CM

## 2023-05-11 NOTE — Discharge Summary (Signed)
Outpatient Physical Therapy           [place]           [x]  Phone: [phone]   Fax: [phone]  [name]           []  Phone: [phone]   Fax: [phone]      To: Primus Bravo, DO     From: Evert Kohl, PT, DPT, Cert. DN      Patient: Joe Avery                    DOB: 06/16/1961  Diagnosis:  Idiopathic peripheral neuropathy [G60.9]        Treatment Diagnosis: decreased BLE strength, STS and gait impairments, tremors       Date: 06/29/2023  []   Progress Note                [x]   Discharge Note    Evaluation Date:  03/17/23   Total Visits to date:  20  Cancels/No-shows to date:  0      Pt was last seen on 05/20/23 and wanted to put his chart on a 30 day hold. He was encouraged to call to schedule should the need arise. Pt has not schedule any mor appts and will be discharged at this time. Pt's last progress note from last visit is below.           Subjective:     pt reports that he is not currently in any pain. He reports that he feels about the same since starting therapy. He would like to put his chart on hold for 30 days since he only has 10 more visits remaining this year. ABC: 42%       Objective/Significant Findings At Last Visit/Comments:    5x STS: 1:08.38 sec from 22" table, time significantly increased today from 39.30 sec last time and table was raised   TUG: 32.45 sec   BLE shaking with STS secondary to decreased strength, increased time for completion and is hesitant with standing due to taking more caution to prevent falls.   ABC decreased by 10% since last time   Increased time for completion of all activities   Difficulty  maintaining TKE bilaterally with activities requiring SLS     Assessment:     Pt tolerated treatment well today.  [name] has not been seeing much improvement since starting PT. Objective measures to check goals today showed a significant decline since last time goals were checked. He has an EMG scheduled for 06/11/23. He only has 10 more visits approved by his  insurance for the whole year, PT and pt elected to put his chart on a 30 day hold to attempt to save those visits should he need them toward the end of the year. Pt would benefit from continued skilled physical therapy to address remaining impairments and limitations, progress toward goal completion, promote independence with ADLs, and prevent further injury.     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 Term Goals  Time Frame for Long Term Goals: 12 visits  Pt will report overall improvement in condition by 50% or more met 5/18  pt will improve ABC to 53% or more to show MDC and subjective improvement not met 7/10  pt will  improve TUG to 15 sec or less for improved ambulation in home not met 7/10  pt will improve 5x STS to 25 sec or less for improved getting out of chairs not met 7/10  pt will improve L knee extension strength to 4-/5 for improved strength in stance phase of gait met 6/7           Patient Status: []  Continue per initial plan of Care     [] 30 day chart hold      [x]  Patient now discharged     []  Additional visits requested, Please re-certify for additional visits:      Requested frequency/duration:      If we are requesting more visits, we fully anticipate the patient's condition is expected to improve within the treatment timeframe we are requesting.    Electronically signed by: Evert Kohl, PT, DPT, Cert. DN   , 06/29/2023, 1:18 PM    If you have any questions or concerns, please don't hesitate to call.  Thank you for your referral.    Physician Signature:______________________ Date:______ Time: ________  By signing above, therapist's plan is approved by physician

## 2023-05-11 NOTE — Other (Signed)
Outpatient Physical Therapy  [place]           [x]  Phone: [phone]   Fax: [phone]  [name]           []  Phone: [phone]   Fax: [phone]        Physical Therapy Daily Treatment Note  Date:  05/11/2023    Patient Name:  Joe Avery    DOB:  1961/02/28  MRN: [id number]  Restrictions/Precautions: Restrictions/Precautions: Fall Risk  Diagnosis:   Idiopathic peripheral neuropathy [G60.9]    Date of Injury/Surgery:   Treatment Diagnosis:  decreased BLE strength, STS and gait impairments, tremors  Insurance/Certification information: [employer]- 30 PCY   Referring Physician:  Primus Bravo, DO     PCP: Rande Brunt, MD  Next Doctor Visit:    Plan of care signed (Y/N):  Yes  Outcome Measure:   ABC: 52%  5x STS: 39.30 sec   TUG: 29.81 sec     Visit# / total visits:  17 /20    Pain level:  4/10 L anterior thigh     Goals:     Patient goals: get back to normal progressing 6/7  Short term goals  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     Long Term Goals  Time Frame for Long Term Goals: 12 visits  Pt will report overall improvement in condition by 50% or more met 5/18  pt will improve ABC to 53% or more to show MDC and subjective improvement almost met 6/7  pt will improve TUG to 15 sec or less for improved ambulation in home not met 6/7  pt will improve 5x STS to 25 sec or less for improved getting out of chairs not met 6/7  pt will improve L knee extension strength to 4-/5 for improved strength in stance phase of gait met 6/7      Summary of Evaluation:  Assessment: Pt is a 62 yo male that presents to therapy with complaints of BLE pain and fatigue. Pt requires increased time for completion of activities. he was diagnosed with IPN two years ago and has been having memory issues since then. STS demo shaking of BLE and bilateral knee flexion. pt amb without AD, with bilateral knees flexed and looking down at foot placement with trunk flexed, wide BOS. Pt does demo some  tremors of BLE and has some short term memory impairments. Pt lives in the basement and has to negotiate stairs everyday at home. He does not use an AD, although it was suggested to him recently. He has underwent many tests (not in [facility] system) but has not had a brain MRI. He has an EMG scheduled for August.  Pt demo s/s of a neurodegenerative disorder but has not been tested regarding those. He has to sit down to get dressed so that he doesnt fall. Pt demo impaired BLE strength, STS, gait, balance, proprioception, activity tolerance, stair negotiation, adls and increased pain. Pt would benefit from continued skilled physical therapy to address impairments and limitations, progress toward goal completion, promote independence with ADLs, and prevent further injury.      Subjective: Pt reports 3/10 discomfort bilat thighs. HEP going fine. He starts okay but struggles thru the rest of it. He has been pushing himself at home with his HEP but doesn't feel a difference.      Any changes in Ambulatory Summary Sheet?  None      Objective:    At arrival slow  ambulation cadence stooped posture with no arm swing and short step height and length   Slow and laborious on th last 4  Possibly from step [place] prior to STS, blat knee buckling   Cued for decreased AROM with extension exercises.      Exercises: (No more than 4 columns)   Exercise/Equipment 05/01/23 #14 05/06/23 #15  05/08/23 #16 05/11/23 #17            WARM UP       Nustep      S10/A9 L5 5'  L5 5'  L5 5' L5 5'          TABLE       STS 10x2 from 20" no UE  10x2 from 19.5" no UE  2*10 from 19.5" no UE  10* 20"    LAQ  10x3" ea 10*3" ea     Seated marches       Seated clam  RTB x20  RTB x20  RTB 30* RTB x20   Seated ball squeeze        bridges       SLR        Trunk rotations              STANDING       Step up ant 6" x10 ea UE PRN    6" x10 ea 1 UE PRN    HR/TR       Marching 1# x10 alt ea 1# x10 alt ea  1# 10* ea 2# 10* ea   Hip 3 way  1# x10 ea bilat  1# x10 ea bilat  1# 10*  ea bilat  2# 10* ea bilat           PROPRIOCEPTION       NBOS       tandem       Marches on foam        Cone taps feet        Cone reaches hands        Wobbleboard  declined             MODALITIES                       Other Therapeutic Activities/Education:    Home Exercise Program:  Issued, practiced and pt demo ability to perform on eval date  5/7: RTB given   Access Code: [email or web address]   URL: [email or web address]   Date: 03/17/2023   Prepared by: Clint Bolder, PT, DPT, Cert. DN   Exercises   - Seated Long Arc Quad  - 2 x daily - 7 x weekly - 2 sets - 10 reps - 3 hold   - Seated March  - 2 x daily - 7 x weekly - 2 sets - 10 reps   - Sit to Stand with Armchair  - 2 x daily - 7 x weekly - 2 sets - 10 reps   - Seated Hip Abduction with Resistance  - 2 x daily - 7 x weekly - 2 sets - 10 reps   - Seated Hip Adduction Isometrics with Ball  - 2 x daily - 7 x weekly - 2 sets - 10 reps - 5 hold     Access Code: [email or web address]  URL: [email or web address]  Date: 03/25/2023  Prepared by: Laroy Apple  Exercises  - Supine Bridge  - 1 x daily - 7 x weekly - 3 sets - 10 reps  -  Supine Straight Leg Raises  - 1 x daily - 7 x weekly - 3 sets - 10 reps  - Supine Lower Trunk Rotation  - 1 x daily - 7 x weekly - 3 sets - 10 reps    5/22: Access Code: [email or web address]  URL: [email or web address]  Date: 04/01/2023  Prepared by: Laroy Apple  Exercises  - Standing Hip Abduction with Counter Support  - 1 x daily - 7 x weekly - 3 sets - 10 reps - 3 hold  - Standing Hip Extension with Counter Support  - 1 x daily - 7 x weekly - 3 sets - 10 reps - 3 hold  - Standing March with Counter Support  - 1 x daily - 7 x weekly - 3 sets - 10 reps  - Seated Hamstring Stretch with Chair  - 1 x daily - 7 x weekly - 3 sets - 10 reps - 1 min hold    Manual Treatments:  none    Modalities:  none    Communication with other providers: none this date    Assessment:   Pt demonstrated good tolerance to tx with increased resistance with standing exercises  and resumed step ups although did show higher fatigue and weakness. Pt reports pushing himself with his HEP. Possibly reduce Difficulty controlling terminal knee extension with WB activities with frequent knee buckling bilaterally. End session pain: same     Plan for Next Session:  BLE strength, STS, gait, balance, proprioception, stair negotiation    Time In / Time Out:  1116/1156    Timed Code/Total Treatment Minutes:  40'/40' 3 TE     Next Progress Note due: 20th visit     Plan of Care Interventions:  [x]  Therapeutic Exercise  []  Modalities:  [x]  Therapeutic Activity     []  Ultrasound  []  Estim  [x]  Gait Training      []  Cervical Traction []  Lumbar Traction  [x]  Neuromuscular Re-education    []  Cold/hotpack []  Iontophoresis   [x]  Instruction in HEP      []  Vasopneumatic   [x]  Dry Needling    [x]  Manual Therapy               []  Aquatic Therapy            Electronically signed by: [place] D Sharline Lehane, PTA, CLT  05/11/2023, 11:16 AM

## 2023-05-13 ENCOUNTER — Inpatient Hospital Stay: Admit: 2023-05-13 | Discharge: 2023-05-13 | Payer: MEDICAID | Primary: Internal Medicine

## 2023-05-13 NOTE — Other (Signed)
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 PCY   Referring Physician:  Primus Bravo, DO     PCP: Rande Brunt, MD  Next Doctor Visit:    Plan of care signed (Y/N):  Yes  Outcome Measure:   ABC: 52%  5x STS: 39.30 sec   TUG: 29.81 sec     Visit# / total visits:  18/20    Pain level:  4/10 L anterior thigh       Goals:     Patient goals: get back to normal progressing 6/7  Short term goals  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     Long Term Goals  Time Frame for Long Term Goals: 12 visits  Pt will report overall improvement in condition by 50% or more met 5/18  pt will improve ABC to 53% or more to show MDC and subjective improvement almost met 6/7  pt will improve TUG to 15 sec or less for improved ambulation in home not met 6/7  pt will improve 5x STS to 25 sec or less for improved getting out of chairs not met 6/7  pt will improve L knee extension strength to 4-/5 for improved strength in stance phase of gait met 6/7      Summary of Evaluation:  Assessment: Pt is a 62 yo male that presents to therapy with complaints of BLE pain and fatigue. Pt requires increased time for completion of activities. he was diagnosed with IPN two years ago and has been having memory issues since then. STS demo shaking of BLE and bilateral knee flexion. pt amb without AD, with bilateral knees flexed and looking down at foot placement with trunk flexed, wide BOS. Pt does demo some  tremors of BLE and has some short term memory impairments. Pt lives in the basement and has to negotiate stairs everyday at home. He does not use an AD, although it was suggested to him recently. He has underwent many tests (not in [facility] system) but has not had a brain MRI. He has an EMG scheduled for August.  Pt demo s/s of a neurodegenerative disorder but has not been tested regarding those. He has to sit down to get dressed so that he doesnt fall. Pt demo impaired BLE strength, STS, gait, balance, proprioception, activity tolerance, stair negotiation, adls and increased pain. Pt would benefit from continued skilled physical therapy to address impairments and limitations, progress toward goal completion, promote independence with ADLs, and prevent further injury.      Subjective: Joe Avery arrives to therapy stating that the L LE is still hurting. Pain is about typical, no new symptoms and pain is not any more intense than usual.      Any changes in Ambulatory Summary Sheet?  None      Objective:    Appears to require a good bit of effort  to perform all exercises.   Extended amount of time necessary for completion of all activities.   Difficulty maintaining terminal knee extension during standing activities.       Exercises: (No more than 4 columns)   Exercise/Equipment 05/08/23 #16 05/11/23 #17 05/13/2023 #18           WARM UP      Nustep      S10/A9 L5 5' L5 5' L5 5'         TABLE      STS 2*10 from 19.5" no UE  10* 20"  10* 20"    LAQ 10*3" ea   -   Seated clam  RTB 30* RTB x20 GTB 2*10         STANDING      Step up ant  6" x10 ea 1 UE PRN  6" 10* ea   Marching 1# 10* ea 2# 10* ea 2# 10* ea   Hip 3 way  1# 10* ea bilat  2# 10* ea bilat  2# 10* ea bilat    TKE   BTB 10* ea   Resisted FM walking?   Next?         PROPRIOCEPTION      NBOS      tandem      Marches on foam       Cone taps feet       Cone reaches hands       Wobbleboard                   MODALITIES                    Other Therapeutic Activities/Education:     Home Exercise Program:  Issued, practiced and pt demo ability to perform on eval date  5/7: RTB given   Access Code: [email or web address]   URL: [email or web address]   Date: 03/17/2023   Prepared by: Clint Bolder, PT, DPT, Cert. DN   Exercises   - Seated Long Arc Quad  - 2 x daily - 7 x weekly - 2 sets - 10 reps - 3 hold   - Seated March  - 2 x daily - 7 x weekly - 2 sets - 10 reps   - Sit to Stand with Armchair  - 2 x daily - 7 x weekly - 2 sets - 10 reps   - Seated Hip Abduction with Resistance  - 2 x daily - 7 x weekly - 2 sets - 10 reps   - Seated Hip Adduction Isometrics with Ball  - 2 x daily - 7 x weekly - 2 sets - 10 reps - 5 hold     Access Code: [email or web address]  URL: [email or web address]  Date: 03/25/2023  Prepared by: Laroy Apple  Exercises  - Supine Bridge  - 1 x daily - 7 x weekly - 3 sets - 10 reps  - Supine Straight Leg Raises  - 1 x daily - 7 x weekly - 3 sets - 10 reps  - Supine Lower Trunk Rotation  - 1 x daily - 7 x weekly - 3 sets - 10 reps    5/22: Access Code: [email or web address]  URL: [email or web address]  Date: 04/01/2023  Prepared by: Laroy Apple  Exercises  - Standing Hip Abduction with Counter Support  - 1 x daily - 7 x weekly - 3 sets - 10 reps - 3 hold  - Standing Hip Extension with Counter Support  - 1  x daily - 7 x weekly - 3 sets - 10 reps - 3 hold  - Standing March with Counter Support  - 1 x daily - 7 x weekly - 3 sets - 10 reps  - Seated Hamstring Stretch with Chair  - 1 x daily - 7 x weekly - 3 sets - 10 reps - 1 min hold    Manual Treatments:  none    Modalities:  none    Communication with other providers: none this date    Assessment: Joe Avery continues to struggle with exercise secondary to weakness and takes extra time to complete all tasks. He fatigues quickly and knees buckle frequently during standing tasks. End session pain: same        Plan for Next Session:  BLE strength, STS, gait, balance, proprioception, stair negotiation    Time In / Time Out:  1105/1143    Timed  Code/Total Treatment Minutes:  86' 3 TE     Next Progress Note due: 20th visit     Plan of Care Interventions:  [x]  Therapeutic Exercise  []  Modalities:  [x]  Therapeutic Activity     []  Ultrasound  []  Estim  [x]  Gait Training      []  Cervical Traction []  Lumbar Traction  [x]  Neuromuscular Re-education    []  Cold/hotpack []  Iontophoresis   [x]  Instruction in HEP      []  Vasopneumatic   [x]  Dry Needling    [x]  Manual Therapy               []  Aquatic Therapy            Electronically signed by: Cordelia Poche, PTA      05/13/2023, 8:37 AM

## 2023-05-18 ENCOUNTER — Inpatient Hospital Stay: Admit: 2023-05-18 | Payer: MEDICAID | Primary: Internal Medicine

## 2023-05-18 NOTE — Other (Signed)
Outpatient Physical Therapy  [place]           [x]  Phone: [phone]   Fax: [phone]  [name]           []  Phone: [phone]   Fax: [phone]        Physical Therapy Daily Treatment Note  Date:  05/18/2023    Patient Name:  Joe Avery    DOB:  1961-04-26  MRN: [id number]  Restrictions/Precautions: Restrictions/Precautions: Fall Risk  Diagnosis:   Idiopathic peripheral neuropathy [G60.9]    Date of Injury/Surgery:   Treatment Diagnosis:  decreased BLE strength, STS and gait impairments, tremors  Insurance/Certification information: [employer]- 30 PCY   Referring Physician:  Primus Bravo, DO     PCP: Rande Brunt, MD  Next Doctor Visit:    Plan of care signed (Y/N):  Yes  Outcome Measure:   ABC: 52%  5x STS: 39.30 sec   TUG: 29.81 sec     Visit# / total visits:  18/20    Pain level:  4/10 L anterior thigh       Goals:     Patient goals: get back to normal progressing 6/7  Short term goals  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     Long Term Goals  Time Frame for Long Term Goals: 12 visits  Pt will report overall improvement in condition by 50% or more met 5/18  pt will improve ABC to 53% or more to show MDC and subjective improvement almost met 6/7  pt will improve TUG to 15 sec or less for improved ambulation in home not met 6/7  pt will improve 5x STS to 25 sec or less for improved getting out of chairs not met 6/7  pt will improve L knee extension strength to 4-/5 for improved strength in stance phase of gait met 6/7      Summary of Evaluation:  Assessment: Pt is a 62 yo male that presents to therapy with complaints of BLE pain and fatigue. Pt requires increased time for completion of activities. he was diagnosed with IPN two years ago and has been having memory issues since then. STS demo shaking of BLE and bilateral knee flexion. pt amb without AD, with bilateral knees flexed and looking down at foot placement with trunk flexed, wide BOS. Pt does demo some  tremors of BLE and has some short term memory impairments. Pt lives in the basement and has to negotiate stairs everyday at home. He does not use an AD, although it was suggested to him recently. He has underwent many tests (not in [facility] system) but has not had a brain MRI. He has an EMG scheduled for August.  Pt demo s/s of a neurodegenerative disorder but has not been tested regarding those. He has to sit down to get dressed so that he doesnt fall. Pt demo impaired BLE strength, STS, gait, balance, proprioception, activity tolerance, stair negotiation, adls and increased pain. Pt would benefit from continued skilled physical therapy to address impairments and limitations, progress toward goal completion, promote independence with ADLs, and prevent further injury.      Subjective: Pt report no pain. He doesn't feel like PT is helping. He might be overdoing it at home. No falls. He used his walls to stabilize himself at home.      Any changes in Ambulatory Summary Sheet?  None      Objective:    Cued for TKE and hip ext to  neutral with STS   STS at 19" no UE,   Slow mobility need for extra time  CGA<>MinA on airex      Exercises: (No more than 4 columns)   Exercise/Equipment 05/08/23 #16 05/11/23 #17 05/13/2023 #18 05/18/23 #19            WARM UP       Nustep      S10/A9 L5 5' L5 5' L5 5' L5 5'          TABLE       STS 2*10 from 19.5" no UE  10* 20"  10* 20"  10* 19"   LAQ 10*3" ea   -    Seated clam  RTB 30* RTB x20 GTB 2*10 GTB 2*10          STANDING       Step up ant  6" x10 ea 1 UE PRN  6" 10* ea    Marching 1# 10* ea 2# 10* ea 2# 10* ea    Hip 3 way  1# 10* ea bilat  2# 10* ea bilat  2# 10* ea bilat     TKE   BTB 10* ea    Resisted FM walking?   Next? 7# all dir  Except 3# to L          PROPRIOCEPTION       NBOS    EO/EC airex   tandem    EO ea. Airex staggered   Marches on foam     CGA 20* airex   Cone taps feet     CGA 10*   Cone reaches hands     CGA 10*   Wobbleboard                      MODALITIES                        Other Therapeutic Activities/Education:    Home Exercise Program:  Issued, practiced and pt demo ability to perform on eval date  5/7: RTB given   Access Code: [email or web address]   URL: [email or web address]   Date: 03/17/2023   Prepared by: Clint Bolder, PT, DPT, Cert. DN   Exercises   - Seated Long Arc Quad  - 2 x daily - 7 x weekly - 2 sets - 10 reps - 3 hold   - Seated March  - 2 x daily - 7 x weekly - 2 sets - 10 reps   - Sit to Stand with Armchair  - 2 x daily - 7 x weekly - 2 sets - 10 reps   - Seated Hip Abduction with Resistance  - 2 x daily - 7 x weekly - 2 sets - 10 reps   - Seated Hip Adduction Isometrics with Ball  - 2 x daily - 7 x weekly - 2 sets - 10 reps - 5 hold     Access Code: [email or web address]  URL: [email or web address]  Date: 03/25/2023  Prepared by: Laroy Apple  Exercises  - Supine Bridge  - 1 x daily - 7 x weekly - 3 sets - 10 reps  - Supine Straight Leg Raises  - 1 x daily - 7 x weekly - 3 sets - 10 reps  - Supine Lower Trunk Rotation  - 1 x daily - 7 x weekly - 3 sets - 10 reps    5/22: Access Code: [email or web address]  URL: [email or web address]  Date: 04/01/2023  Prepared by: Laroy Apple  Exercises  - Standing Hip Abduction with Counter Support  - 1 x daily - 7 x weekly - 3 sets - 10 reps - 3 hold  - Standing Hip Extension with Counter Support  - 1 x daily - 7 x weekly - 3 sets - 10 reps - 3 hold  - Standing March with Counter Support  - 1 x daily - 7 x weekly - 3 sets - 10 reps  - Seated Hamstring Stretch with Chair  - 1 x daily - 7 x weekly - 3 sets - 10 reps - 1 min hold    Manual Treatments:  none    Modalities:  none    Communication with other providers: none this date    Assessment:  Pt did well with added exercises and balance focus tx. Noted continued slow mobility with need for extra time. BLE weakness and balance deficits on uneven surfaces and SL dynamic activities with instability. Pt would continue to benefit from skilled therapy interventions to address remaining  impairments, improve mobility and strength and progress toward goal completion while reducing risk for re-injury or further decline.   End session pain: 4/10 above knees       Plan for Next Session:  BLE strength, STS, gait, balance, proprioception, stair negotiation    Time In / Time Out:  1116/1159    Timed Code/Total Treatment Minutes: 43'/43' 1 TA TE 1 NR     Next Progress Note due: 20th visit     Plan of Care Interventions:  [x]  Therapeutic Exercise  []  Modalities:  [x]  Therapeutic Activity     []  Ultrasound  []  Estim  [x]  Gait Training      []  Cervical Traction []  Lumbar Traction  [x]  Neuromuscular Re-education    []  Cold/hotpack []  Iontophoresis   [x]  Instruction in HEP      []  Vasopneumatic   [x]  Dry Needling    [x]  Manual Therapy               []  Aquatic Therapy            Electronically signed by: [place] D Monisha Siebel, PTA, CLT   05/18/2023, 11:16 AM

## 2023-05-20 ENCOUNTER — Inpatient Hospital Stay: Admit: 2023-05-20 | Payer: MEDICAID | Primary: Internal Medicine

## 2023-05-20 NOTE — Progress Notes (Cosign Needed)
Outpatient Physical Therapy           [place]           [x]  Phone: [phone]   Fax: [phone]  [name]           []  Phone: [phone]   Fax: [phone]      To: Primus Bravo, DO     From: Evert Kohl, PT, DPT, Cert. DN      Patient: Joe Avery                    DOB: 1961-02-17  Diagnosis:  Idiopathic peripheral neuropathy [G60.9]        Treatment Diagnosis: decreased BLE strength, STS and gait impairments, tremors       Date: 05/20/2023  [x]   Progress Note                []   Discharge Note    Evaluation Date:  03/17/23   Total Visits to date:  20  Cancels/No-shows to date:  0    Subjective:     pt reports that he is not currently in any pain. He reports that he feels about the same since starting therapy. He would like to put his chart on hold for 30 days since he only has 10 more visits remaining this year. ABC: 42%       Objective/Significant Findings At Last Visit/Comments:    5x STS: 1:08.38 sec from 22" table, time significantly increased today from 39.30 sec last time and table was raised   TUG: 32.45 sec   BLE shaking with STS secondary to decreased strength, increased time for completion and is hesitant with standing due to taking more caution to prevent falls.   ABC decreased by 10% since last time   Increased time for completion of all activities   Difficulty  maintaining TKE bilaterally with activities requiring SLS     Assessment:     Pt tolerated treatment well today.  [name] has not been seeing much improvement since starting PT. Objective measures to check goals today showed a significant decline since last time goals were checked. He has an EMG scheduled for 06/11/23. He only has 10 more visits approved by his insurance for the whole year, PT and pt elected to put his chart on a 30 day hold to attempt to save those visits should he need them toward the end of the year. Pt would benefit from continued skilled physical therapy to address remaining impairments and limitations,  progress toward goal completion, promote independence with ADLs, and prevent further injury.     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 Term Goals  Time Frame for Long Term Goals: 12 visits  Pt will report overall improvement in condition by 50% or more met 5/18  pt will improve ABC to 53% or more to show MDC and subjective improvement not met 7/10  pt will improve TUG to 15 sec or less for improved ambulation in home not met 7/10  pt will improve 5x STS to 25 sec or less for improved getting out of chairs not met 7/10  pt will improve L knee extension strength to 4-/5 for improved strength in stance phase of gait met 6/7  Patient Status: []  Continue per initial plan of Care     [x] 30 day chart hold      []  Patient now discharged     [x]  Additional visits requested, Please re-certify for additional visits:      Requested frequency/duration:  2 /week for 5 weeks (pending chart hold)     If we are requesting more visits, we fully anticipate the patient's condition is expected to improve within the treatment timeframe we are requesting.    Electronically signed by: Evert Kohl, PT, DPT, Cert. DN   , 05/20/2023, 9:21 AM    If you have any questions or concerns, please don't hesitate to call.  Thank you for your referral.    Physician Signature:______________________ Date:______ Time: ________  By signing above, therapist's plan is approved by physician

## 2023-05-20 NOTE — Other (Signed)
Outpatient Physical Therapy  [place]           [x]  Phone: [phone]   Fax: [phone]  [name]           []  Phone: [phone]   Fax: [phone]        Physical Therapy Daily Treatment Note  Date:  05/20/2023    Patient Name:  Joe Avery    DOB:  [birth year]  MRN: [id number]  Restrictions/Precautions: Restrictions/Precautions: Fall Risk  Diagnosis:   Idiopathic peripheral neuropathy [G60.9]    Date of Injury/Surgery:   Treatment Diagnosis:  decreased BLE strength, STS and gait impairments, tremors  Insurance/Certification information: [employer]- 30 PCY   Referring Physician:  Primus Bravo, DO     PCP: Rande Brunt, MD  Next Doctor Visit:    Plan of care signed (Y/N):  Yes  Outcome Measure:   ABC: 42%  5x STS: 39.30 sec, 1:08.38 sec 7/10  TUG: 32.45 sec     Visit# / total visits:  20/30    Pain level: 0 /10      Goals:     Patient goals: get back to normal progressing 7/10  Short term goals  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     Long Term Goals  Time Frame for Long Term Goals: 12 visits  Pt will report overall improvement in condition by 50% or more met 5/18  pt will improve ABC to 53% or more to show MDC and subjective improvement not met 7/10  pt will improve TUG to 15 sec or less for improved ambulation in home not met 7/10  pt will improve 5x STS to 25 sec or less for improved getting out of chairs not met 7/10  pt will improve L knee extension strength to 4-/5 for improved strength in stance phase of gait met 6/7      Summary of Evaluation:  Assessment: Pt is a 62 yo male that presents to therapy with complaints of BLE pain and fatigue. Pt requires increased time for completion of activities. he was diagnosed with IPN two years ago and has been having memory issues since then. STS demo shaking of BLE and bilateral knee flexion. pt amb without AD, with bilateral knees flexed and looking down at foot placement with trunk flexed, wide BOS. Pt does demo some  tremors of BLE and has some short term memory impairments. Pt lives in the basement and has to negotiate stairs everyday at home. He does not use an AD, although it was suggested to him recently. He has underwent many tests (not in [facility] system) but has not had a brain MRI. He has an EMG scheduled for August.  Pt demo s/s of a neurodegenerative disorder but has not been tested regarding those. He has to sit down to get dressed so that he doesnt fall. Pt demo impaired BLE strength, STS, gait, balance, proprioception, activity tolerance, stair negotiation, adls and increased pain. Pt would benefit from continued skilled physical therapy to address impairments and limitations, progress toward goal completion, promote independence with ADLs, and prevent further injury.      Subjective: pt reports that he is not currently in any pain. He reports that he feels about the same since starting therapy. He would like to put his chart on hold for 30 days since he only has 10 more visits remaining this year. ABC: 42%       Any changes in Ambulatory Summary Sheet?  None  Objective:    5x STS: 1:08.38 sec from 22" table, time significantly increased today from 39.30 sec last time and table was raised   TUG: 32.45 sec   BLE shaking with STS secondary to decreased strength, increased time for completion and is hesitant with standing due to taking more caution to prevent falls.   ABC decreased by 10% since last time   Increased time for completion of all activities   Difficulty  maintaining TKE bilaterally with activities requiring SLS       Exercises: (No more than 4 columns)   Exercise/Equipment 05/13/2023 #18 05/18/23 #19 05/20/23 #20            WARM UP      Nustep      S10/A9 L5 5' L5 5' L5 5'          TABLE      STS 10* 20"  10* 19" 5x2 from 22"    LAQ -     Seated clam  GTB 2*10 GTB 2*10 GTB 10x2          STANDING      Step up ant 6" 10* ea     Marching 2# 10* ea     Hip 3 way  2# 10* ea bilat   2# x10 ea bilat    TKE BTB 10* ea      Resisted FM walking? Next? 7# all dir  Except 3# to L          PROPRIOCEPTION      NBOS  EO/EC airex    tandem  EO ea. Airex staggered    Marches on foam   CGA 20* airex    Cone taps feet   CGA 10*    Cone reaches hands   CGA 10*    Wobbleboard                   MODALITIES                    Other Therapeutic Activities/Education:    Home Exercise Program:  Issued, practiced and pt demo ability to perform on eval date  5/7: RTB given   Access Code: [email or web address]   URL: [email or web address]   Date: 03/17/2023   Prepared by: Clint Bolder, PT, DPT, Cert. DN   Exercises   - Seated Long Arc Quad  - 2 x daily - 7 x weekly - 2 sets - 10 reps - 3 hold   - Seated March  - 2 x daily - 7 x weekly - 2 sets - 10 reps   - Sit to Stand with Armchair  - 2 x daily - 7 x weekly - 2 sets - 10 reps   - Seated Hip Abduction with Resistance  - 2 x daily - 7 x weekly - 2 sets - 10 reps   - Seated Hip Adduction Isometrics with Ball  - 2 x daily - 7 x weekly - 2 sets - 10 reps - 5 hold     Access Code: [email or web address]  URL: [email or web address]  Date: 03/25/2023  Prepared by: Laroy Apple  Exercises  - Supine Bridge  - 1 x daily - 7 x weekly - 3 sets - 10 reps  - Supine Straight Leg Raises  - 1 x daily - 7 x weekly - 3 sets - 10 reps  - Supine Lower Trunk Rotation  - 1 x daily - 7 x weekly - 3 sets - 10  reps    5/22: Access Code: [email or web address]  URL: [email or web address]  Date: 04/01/2023  Prepared by: Laroy Apple  Exercises  - Standing Hip Abduction with Counter Support  - 1 x daily - 7 x weekly - 3 sets - 10 reps - 3 hold  - Standing Hip Extension with Counter Support  - 1 x daily - 7 x weekly - 3 sets - 10 reps - 3 hold  - Standing March with Counter Support  - 1 x daily - 7 x weekly - 3 sets - 10 reps  - Seated Hamstring Stretch with Chair  - 1 x daily - 7 x weekly - 3 sets - 10 reps - 1 min hold    Manual Treatments:  none    Modalities:  none    Communication with other providers: PN sent     Assessment:Pt tolerated  treatment well today.  Joe Avery has not been seeing much improvement since starting PT. Objective measures to check goals today showed a significant decline since last time goals were checked. He has an EMG scheduled for 06/11/23. He only has 10 more visits approved by his insurance for the whole year, PT and pt elected to put his chart on a 30 day hold to attempt to save those visits should he need them toward the end of the year. Pt would benefit from continued skilled physical therapy to address remaining impairments and limitations, progress toward goal completion, promote independence with ADLs, and prevent further injury. end pain: 0/10           Plan for Next Session:  BLE strength, STS, gait, balance, proprioception, stair negotiation    Time In / Time Out:  1115/1153    Timed Code/Total Treatment Minutes: 38/38: 2 TE 1 TA     Next Progress Note due: pending chart hold     Plan of Care Interventions:  [x]  Therapeutic Exercise  []  Modalities:  [x]  Therapeutic Activity     []  Ultrasound  []  Estim  [x]  Gait Training      []  Cervical Traction []  Lumbar Traction  [x]  Neuromuscular Re-education    []  Cold/hotpack []  Iontophoresis   [x]  Instruction in HEP      []  Vasopneumatic   [x]  Dry Needling    [x]  Manual Therapy               []  Aquatic Therapy            Electronically signed by: Evert Kohl, PT, DPT, Cert. DN     05/20/2023, 9:20 AM

## 2023-06-11 ENCOUNTER — Inpatient Hospital Stay: Payer: MEDICAID | Primary: Internal Medicine

## 2023-06-11 ENCOUNTER — Ambulatory Visit: Admit: 2023-06-11 | Discharge: 2023-06-11 | Payer: MEDICAID | Attending: Neurology | Primary: Internal Medicine

## 2023-06-11 DIAGNOSIS — G609 Hereditary and idiopathic neuropathy, unspecified: Secondary | ICD-10-CM

## 2023-06-11 NOTE — Telephone Encounter (Signed)
Received call back from pt and notified that pt he needs to have labs done and can go to [facility], given address and information.

## 2023-06-11 NOTE — Telephone Encounter (Signed)
Left msg for pt to call back.     Pt needs labs done prior to appt 06/12/23

## 2023-06-11 NOTE — Progress Notes (Signed)
EMG/NCS Bilateral Lower Extremities    Reason for referral/Clinical data:    Erinn presents today for bilateral lower extremity EMG to assess generalized weakness that he has been experiencing for approximately 2 years.  He states that 2 years and 4 months ago he had a fall.  The day following is when all of his symptoms began.  He states that he has had significant difficulties in ambulation and generally feeling very weak.  He does endorse numbness of his feet bilaterally.    Refer to the Electrodiagnostic Data Sheet for normative values, specific techniques utilized for conductions, the numeric values obtained on nerve conductions, and specific muscles sampled for needle examination.   Informed consent was given by the patient after discussion of the following - Expected potential benefits of procedure include the following: identifying diagnoses, excluding diagnoses, and helping the treating practitioners to prescribe treatment, testing, and follow-up. Possible adverse events of electrodiagnostic testing include local discomfort, mild bleeding or bruising (common) and rare/uncommon events such as infection, nausea, fainting, or other idiosyncratic adverse events.    Motor studies:  -- Right tibial motor response demonstrates amplitude which is normal, distal latency which is normal, and conduction velocity which is normal.   -- Left tibial motor response demonstrates amplitude which is normal, distal latency which is normal, and conduction velocity which is normal.  -- Right peroneal motor response demonstrates amplitude which is normal, distal latency which is normal, and conduction velocity which is normal.   -- Left peroneal motor response demonstrates amplitude which is normal, distal latency which is normal, and conduction velocity which is normal.     Sensory studies:  -- Right Superficial Peroneal sensory nerve conduction response demonstrates amplitude which is normal, and distal latency which is normal.    -- Left Superficial Peroneal sensory nerve conduction response demonstrates normal amplitude, and distal latency which is normal.   -- Right sural sensory nerve conduction response demonstrates normal amplitude, and distal latency which is normal.   -- Left sural sensory nerve conduction response demonstrates normal amplitude, and distal latency which is normal.     Medial and lateral plantar mixed sensory studies are normal for amplitude and distal latency bilaterally.    NEEDLE ELECTRODE EXAMINATION  Needle examination performed throughout multiple, selected muscles of the bilateral lower limbs and lumbar paraspinals (as detailed on the data sheet) demonstrates findings which are mildly abnormal with enlargement of two S1 innervated muscles of the right lower extremity.       Diagnosis Orders   1. Idiopathic peripheral neuropathy  Nerve Conduction Test with EMG      2. 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 There is no evidence of ongoing axon loss. Overall electrodiagnostic severity is mild.   Otherwise, no evidence to suggest mononeuropathy, plexopathy, generalized neuropathy or myopathy.  Certainly, clinical consideration could be given for central etiology given the context of patient's symptoms and normal EMG findings.    Thank you for allowing [place] to participate in the care of your patient.  Please do not hesitate to contact us with questions.       Joni Reining  Etoile Looman, DO  06/11/2023 10:26 AM

## 2023-06-12 ENCOUNTER — Encounter
Admit: 2023-06-12 | Discharge: 2023-06-12 | Payer: MEDICAID | Attending: Student in an Organized Health Care Education/Training Program | Primary: Internal Medicine

## 2023-06-12 DIAGNOSIS — M79651 Pain in right thigh: Secondary | ICD-10-CM

## 2023-06-12 LAB — VITAMIN B12 & FOLATE
Folate: 10.7 NG/ML (ref 3.1–17.5)
Vitamin B-12: 244.2 pg/ml (ref 211–911)

## 2023-06-12 LAB — VITAMIN D 25 HYDROXY: Vit D, 25-Hydroxy: 18.26 NG/ML — ABNORMAL LOW (ref >20)

## 2023-06-12 LAB — IRON AND TIBC
Iron: 53 ug/dL — ABNORMAL LOW (ref 59–158)
TIBC: 249 ug/dL — ABNORMAL LOW (ref 250–450)
Transferrin %: 21 % (ref 10–44)
UIBC: 196 ug/dL (ref 110–370)

## 2023-06-12 NOTE — Progress Notes (Signed)
Consult Note  [facility]  Patient Name: Joe Avery  DOB: 11-Jan-1961        Subjective:   Reason for consult:   Patient seen and examined. Chart reviewed in detail.    62 y.o. -male with PMH hypertension, "borderline diabetes," presenting to [facility] for bilateral anterior thigh pain radiating into bilateral knees which started after a fall March 2022.  Initial exam was somewhat interesting with sensory evidence of peripheral neuropathy and possible bilateral L4/5 radiculopathy, though neither of those were demonstrated on EMG.  He also demonstrates spasticity in his right leg and brisk reflexes at the bilateral patellas, which are both unexpected and would not fit well with bilateral L4 radiculopathies.     He tells me his bilateral leg symptoms persist.    We discussed the result of his EMG showing chronic mild right S1 radiculopathy (likely asymptomatic, unrelated) and would not explain his symptoms.    He was referred last visit to physical therapy and tells me this has been helpful initially then stopped helping.     -------------------------------------  HPI:?    He has been tried on gabapentin  to improve the dysesthesia with significant benefit. Does make him really sedated. Can't use it during the day.   ?  He denies history of alcoholism, chemotherapy, radiation. Was recently found to have borderline elevated hemoglobin A1c at 6.5 but has not yet been started on medication.  ?  + family history of peripheral neuropathy, dad lost toes from it he says .    January lab work included normal TSH, elevated hemoglobin A1c at 6.5         No data to display                 No past medical history on file. :   No past surgical history on file.      No Known Allergies Patient denies others  Social History     Socioeconomic History    Marital status: Single     Spouse name: Not on file    Number of children: Not on file    Years of education: Not on file    Highest education level: Not on file    Occupational History    Not on file   Tobacco Use    Smoking status: Some Days     Types: Cigarettes    Smokeless tobacco: Never   Substance and Sexual Activity    Alcohol use: Not on file    Drug use: Not on file    Sexual activity: Not on file   Other Topics Concern    Not on file   Social History Narrative    Not on file     Social Determinants of Health     Financial Resource Strain: Not on file   Food Insecurity: Not on file   Transportation Needs: Not on file   Physical Activity: Not on file   Stress: Not on file   Social Connections: Not on file   Intimate Partner Violence: Not on file   Housing Stability: Not on file    Patient denies others  No family history on file. Patient denies others.       ROS (10 systems)  10 point review of systems was completed and was negative except the above.     Physical Exam:       @NEWVITALS @   Wt Readings from Last 3 Encounters:   06/12/23 57.6 kg (127 lb)  Temp Readings from Last 3 Encounters:   No data found for Temp     BP Readings from Last 3 Encounters:   06/12/23 124/72   03/04/23 114/78     Pulse Readings from Last 3 Encounters:   06/12/23 72   03/04/23 (!) 101      Gen: A&O x 4, NAD, cooperative  HEENT: NC/AT, EOMI, PERRL, mmm, neck supple, no meningeal signs;   ,   Heart: without central or acrocyanosis  Lungs: Without signs of respiratory distress  Ext: no edema, no calf tenderness b/l  Psych: normal mood and affect  Skin: no rashes or lesions    NEUROLOGIC EXAM:     Mental Status: A&O to self, location, month and year, NAD, speech clear, language fluent, repetition and naming intact, follows commands appropriately    Cranial Nerve Exam:   CN II-XII: PERRL, VFF, no nystagmus, no gaze paresis, sensation V1-V3 intact b/l, muscles of facial expression symmetric; hearing intact to conversational tone, palate elevates symmetrically, shoulder elevation symmetric and tongue protrudes midline with movement side to side.    Motor Exam:       Strength 5/5 UE's/LE's  b/l  Spasticity noted RLE  No pronator drift    Deep Tendon Reflexes: 2/4 brachioradialis, 2+ patellar BL, 2/4 L and 0/4 R  achilles b/l. + Hoffman BL.  Without ankle clonus BL.    Sensation: Intact light touch UE's/LE's b/l  BL 4/5 distribution decreased temp   BL LE decreased temp, vib, pin    Coordination/Cerebellum:       Tremors--none      Finger-to-Nose: no dysmetria b/l    Gait and stance:      Gait: Unsteady, walks with knees bent      LABS:        CBC: No results for input(s): "WBC", "RBC", "HGB", "HCT", "PLT", "MCV" in the last 72 hours.  BMP:  No results for input(s): "NA", "K", "CL", "CO2", "BUN", "CREATININE", "GLUCOSE", "CALCIUM" in the last 72 hours.    IMAGING:      EMG BL LEs 06/11/2023 with Dr. Lyn Hollingshead  This is an abnormal electrodiagnostic study of the bilateral lower extremities notable for the following:    Probable chronic right S1 sacral radiculopathy with evidence of chronic axon loss with motor unit reinnervation in 2 right lower limb muscles corresponding to the S1 nerve root level. There is no evidence of ongoing axon loss. Overall electrodiagnostic severity is mild.   Otherwise, no evidence to suggest mononeuropathy, plexopathy, generalized neuropathy or myopathy.  Certainly, clinical consideration could be given for central etiology given the context of patient's symptoms and normal EMG findings.    ASSESSMENT/PLAN:       ICD-10-CM    1. Bilateral thigh pain  M79.651     M79.652       2. Lumbosacral radiculopathy  M54.17       3. Idiopathic peripheral neuropathy  G60.9       4. Spasticity  R25.2              62 y.o. -male with PMH hypertension, "borderline diabetes," presenting to [facility] for bilateral anterior thigh pain radiating into bilateral knees which started after a fall March 2022.  Initial exam was somewhat interesting with sensory evidence of peripheral neuropathy and possible bilateral L4/5 radiculopathy, though neither of those were demonstrated on EMG.  He  also demonstrates spasticity in his right leg and brisk reflexes at the bilateral patellas, which are both unexpected  and would not fit well with bilateral L4 radiculopathies.         Will obtain MRI of the lumbar spine to assess for bilateral L4 nerve root compression which of course sometimes do not show up on EMG.  Will also evaluate for the right S1 radiculopathy demonstrated on EMG, although I do not feel it is related to his presenting symptoms.  Will obtain MRI of the cervical and thoracic spine to look for a spinal cord injury which could explain his bilateral lower extremity pain, right leg spasticity, and bilateral lower extremity distal sensory deficits.  Gabapentin 100 mg nightly to continue- helpful however limited by sedation.  His lab work resulted normal except for low vitamin D, and we discussed starting supplementation.        Neurodiagnostics as above      We will plan to see the patient back in 4 weeks, w me if possible.  He did voice okay with scheduling with APP if no availability with me.    > 60 min were spent on this encounter including chart review, reviewing prior imaging and lab work, in interview and exam of patient, and in documentation.    (Please note that portions of this note were completed with a voice recognition program.  Efforts were made to edit the dictations but occasionally words are mistranscribed.)      Primus Bravo, DO, 06/12/2023

## 2023-06-16 LAB — PROTEIN ELECTROPHORESIS, URINE

## 2023-06-16 LAB — SPECIMEN REJECTION

## 2023-06-17 LAB — ELECTROPHORESIS PROTEIN, SERUM
Albumin Electrophoresis: 4.1 GM/DL (ref 3.2–5.6)
Alpha-1-Globulin: 0.3 GM/DL (ref 0.1–0.4)
Alpha-2-Globulin: 0.9 GM/DL (ref 0.4–1.2)
Beta Globulin: 1 GM/DL (ref 0.5–1.3)
Gamma Globulin: 0.9 GM/DL (ref 0.5–1.6)
SPEP Interpretation: NORMAL GM/DL (ref 3.2–5.6)
Total Protein: 7.2 GM/DL (ref 6.4–8.2)

## 2023-06-23 NOTE — Telephone Encounter (Signed)
Pt was informed and voiced understanding.

## 2023-06-23 NOTE — Telephone Encounter (Signed)
Left msg for pt to call back.     Primus Bravo, DO  [facility]  Kaevion Root urine sample needs repeated or urine electrophoresis

## 2023-06-24 ENCOUNTER — Inpatient Hospital Stay
Admit: 2023-06-24 | Payer: MEDICAID | Attending: Student in an Organized Health Care Education/Training Program | Primary: Internal Medicine

## 2023-06-24 DIAGNOSIS — R252 Cramp and spasm: Secondary | ICD-10-CM

## 2023-06-24 DIAGNOSIS — M79652 Pain in left thigh: Secondary | ICD-10-CM

## 2023-06-25 LAB — PROTEIN / CREATININE RATIO, URINE
Creatinine, Ur: 121.6 MG/DL (ref 39–259)
Prot/Creat Ratio, Ur: 0.1 (ref <0.2)
Urine Total Protein: 8.5 MG/DL (ref <12)

## 2023-07-08 ENCOUNTER — Encounter

## 2023-07-08 NOTE — Telephone Encounter (Addendum)
 Left msg for pt to call back.     ----- Message from Dr. Derrell Louder, DO sent at 07/08/2023  3:35 PM EDT -----  Please notify patient Joe Avery:    Imaging shows mild compression of spinal cord in the cervical and thoracic spine.  His most symptomatic lesion is pinching on nerve roots at L3/4 and L5/S1.  I will send a referral to a spine surgeon.

## 2023-07-08 NOTE — Telephone Encounter (Signed)
 Pt notified of results and recommendations. Pt voiced understanding. Referral faxed to Dr. Luther Redo.

## 2023-07-08 NOTE — Progress Notes (Signed)
 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      He is symptomatic however from the moderate bilateral L3/4 nerve root compression, for which I will refer him to neurosurgery, Dr. Margarete.      He has right S1 radiculopathy demonstrated on his EMG which correlates with the moderate bilateral L5/S1 nerve root compression on his MRI, however this is seemingly asymptomatic at this time.    Tarisa Paola, DO

## 2023-08-06 ENCOUNTER — Ambulatory Visit: Admit: 2023-08-06 | Discharge: 2023-08-06 | Payer: MEDICAID | Attending: Family | Primary: Internal Medicine

## 2023-08-06 VITALS — BP 118/82 | HR 104 | Wt 124.0 lb

## 2023-08-06 DIAGNOSIS — M5417 Radiculopathy, lumbosacral region: Secondary | ICD-10-CM

## 2023-08-06 MED ORDER — GABAPENTIN 100 MG PO CAPS
100 | ORAL_CAPSULE | Freq: Two times a day (BID) | ORAL | 11 refills | Status: DC
Start: 2023-08-06 — End: 2024-10-13

## 2023-08-06 NOTE — Progress Notes (Signed)
 08/06/23    Joe Avery  [birth year]    Chief Complaint   Patient presents with    Follow-up     4W follow up for Bilateral thigh pain.        History of Present Illness  Joe Avery is a 62 y.o. male presenting today for follow-up nq:[id number] anterior thigh pain radiating into bilateral knees which started after a fall March 2022.       EMG of the lower extremities showed probable mild chronic right S1 sacral radiculopathy with evidence of chronic axon loss with motor unit reinnervation into right lower limb muscles corresponding to the S1 nerve root level.  There is no ongoing evidence of axonal loss.  Findings are likely asymptomatic and unrelated.  He also completed physical therapy.  Last visit, Dr. Vicci ordered MRI of the lumbar spine to assess for bilateral L4 nerve root compression.  MRI of the cervical and thoracic spine was ordered to look for spinal cord injury which could explain his bilateral lower extremity pain, right leg spasticity, bilateral lower extremity distal sensory deficits.  He continues gabapentin  100 mg nightly-it does cause him to feel sedated though.  He was recommended to start vitamin D  supplementation.    His MRI showed mild compression of spinal cord in the cervical and thoracic spine.  His most symptomatic lesion is pinching on nerve roots at L3-4 and L5-S1.  He was referred to Dr. Lenette.    Today, he tells me that he is having left knee pain.  He tells me that someone gave him an injection in his knee?  And to come back for an injection in his back.  He could not tell me which provider it was.  He is not sure if he has seen Dr. Lenette yet.    Regarding the gabapentin , he would like to increase the dose a little bit.  Current Outpatient Medications   Medication Sig Dispense Refill    gabapentin  (NEURONTIN ) 100 MG capsule Take 1 capsule by mouth in the morning and at bedtime. 60 capsule 11    amLODIPine  (NORVASC ) 10 MG tablet Take 1 tablet by mouth daily      aspirin  325 MG EC tablet Take  1 tablet by mouth daily      lisinopril  (PRINIVIL ;ZESTRIL ) 40 MG tablet Take 1 tablet by mouth daily      hydroCHLOROthiazide 12.5 MG tablet Take 1 tablet by mouth daily (Patient not taking: Reported on 08/06/2023)       No current facility-administered medications for this visit.     BP 118/82   Pulse (!) 104   Wt 56.2 kg (124 lb)   SpO2 100%   Physical Exam:  Mental Status: A&O to self, location, month and year, NAD, speech clear, language fluent, repetition and naming intact, follows commands appropriately     Cranial Nerve Exam:   CN II-XII: PERRL, VFF, no nystagmus, no gaze paresis, sensation V1-V3 intact b/l, muscles of facial expression symmetric; hearing intact to conversational tone, palate elevates symmetrically, shoulder elevation symmetric and tongue protrudes midline with movement side to side.     Motor Exam:       Strength 5/5 UE's/LE's b/l  Spasticity noted RLE  No pronator drift     Deep Tendon Reflexes: 2/4 brachioradialis, 2+ patellar BL, 2/4 L and 0/4 R  achilles b/l. + Hoffman BL.  Without ankle clonus BL.     Sensation: Intact light touch UE's/LE's b/l  BL 4/5 distribution decreased temp  BL LE decreased temp, vib, pin     Coordination/Cerebellum:       Tremors--none      Finger-to-Nose: no dysmetria b/l     Gait and stance:      Gait: Unsteady, walks with knees bent  Assessment and Plan     Diagnosis Orders   1. Lumbosacral radiculopathy        2. Idiopathic peripheral neuropathy  gabapentin  (NEURONTIN ) 100 MG capsule      3. Bilateral thigh pain        Joe Avery was seen in neurological follow up in regards to lumbosacral radiculopathy, peripheral neuropathy.   -He has participated in physical therapy.  -He was referred to Dr. Lenette for lumbosacral radiculopathy.  He cannot tell me if he has seen their office yet.  He tells me he was given a knee injection but cannot remember which office performed it.  He also tells me the plan is to get a spinal injection at some point.  -I did increase his  gabapentin  to 200 mg nightly for neuropathic pain.  -Will consider rechecking vitamin D  levels at his next follow-up if he is still taking his supplement.    Return in about 6 months (around 02/03/2024).    Alfonso Edrie Funk, APRN - CNP

## 2023-08-11 ENCOUNTER — Ambulatory Visit: Admit: 2023-08-11 | Discharge: 2023-08-11 | Payer: MEDICAID | Attending: Nephrology | Primary: Internal Medicine

## 2023-08-11 DIAGNOSIS — N1831 Chronic kidney disease, stage 3a (HCC): Secondary | ICD-10-CM

## 2023-08-11 MED ORDER — VITAMIN D (ERGOCALCIFEROL) 1.25 MG (50000 UT) PO CAPS
1.25 MG (50000 UT) | ORAL_CAPSULE | ORAL | 3 refills | Status: DC
Start: 2023-08-11 — End: 2024-04-19

## 2023-08-11 NOTE — Patient Instructions (Signed)
 INSTRUCTIONS:   #1 low-salt diet   #2 stay hydrated monitor blood pressure and volume status   #3 start on vitamin D  therapy once weekly   #4 avoid Aleve for pain   #5 increase fluid intake   #6 control sugar   #7  maintain current care and follow-up with lab work in 2 months

## 2023-08-11 NOTE — Progress Notes (Signed)
 Patient:   Joe Avery    Date:  08/11/23  DOB:  September 26, 1961,   Nephrologist: ALRIC DELENA SNOW, MD  Provider: Fernand Ruffing, MD    Chief Complaint/Reason for Consult:  Evaluate proteinuria and renal insufficiency      HISTORY OF PRESENT ILLNESS:   The patient is a 62 y.o. male who presents with  concern for stage III kidney disease creatinine is to be around 1.2 is increased to 1.4 wanted reevaluation by nephrology.  He has had diabetes diagnosed less than 1 year A1c closer to 7-8 range slightly high blood pressure but also anxious today as he is here for his visit as noted in the past have slightly iron deficiency and low vitamin D  levels.  Although he is off the hydrochlorothiazide he still on the lisinopril  minimal proteinuria with that medication potassium has been stable he is fairly skinny tries to take good care of himself and with above changes was asked to be seen by nephrology.      Past Medical History:        Diagnosis Date    Hypertension     Type 2 diabetes mellitus without complication, without long-term current use of insulin  (HCC) 08/11/2023       Past Surgical History:    History reviewed. No pertinent surgical history.    Medications:   Current Outpatient Medications   Medication Sig Dispense Refill    raNITIdine HCl (RANITIDINE 150 MAX STRENGTH PO) Take by mouth as needed      Aspirin -Acetaminophen -Caffeine (EXCEDRIN PO) Take by mouth as needed      gabapentin  (NEURONTIN ) 100 MG capsule Take 1 capsule by mouth in the morning and at bedtime. 60 capsule 11    amLODIPine  (NORVASC ) 10 MG tablet Take 1 tablet by mouth daily      aspirin  325 MG EC tablet Take 1 tablet by mouth as needed      lisinopril  (PRINIVIL ;ZESTRIL ) 40 MG tablet Take 1 tablet by mouth daily       No current facility-administered medications for this visit.        Allergies:  Patient has no known allergies.    Social History:   TOBACCO:   reports that he has been smoking cigarettes. He has never used smokeless tobacco.  ETOH:   has  no history on file for alcohol use.  OCCUPATION:      Family History:       Problem Relation Age of Onset    Diabetes Father        REVIEW OF SYSTEMS:  Negative except for   No fevers chills chest pain shortness of breath positive diabetes.      Vitals: BP 138/78   Pulse (!) 109   Ht 1.753 m (5' 9)   Wt 56.9 kg (125 lb 6.4 oz)   SpO2 97%   BMI 18.52 kg/m   General appearance: awake weak  HEENT: Head: Normal, normocephalic, atraumatic.  Neck: supple, symmetrical, trachea midline  Lungs: diminished breath sounds bilaterally  Heart: S1, S2 normal  Abdomen: abnormal findings:  soft NT  Extremities: edema none  Neurologic: Mental status: alertness: alert    LABS:  LABS:  CBC: No results found for: WBC, HGB, PLT  Renal Panel:   Lab Results   Component Value Date/Time    LABALBU SPECIMEN QUANTITY NOT SUFFICIENT 06/11/2023 02:51 PM       Lipids: No results found for: CHOL, HDL  INR: No results found for: INR  Calcium :  No  results found for: CALCIUM , PTH  Phosphorus:  No results found for: PHOS    U/A:  No results found for: PROTEINU, NITRU, COLORU, PHUR, LABCAST, WBCUA, RBCUA, MUCUS, TRICHOMONAS, YEAST, BACTERIA, CLARITYU, SPECGRAV, UROBILINOGEN, BILIRUBINUR, BLOODU, GLUCOSEU, KETUA, AMORPHOUS    HgBA1c:  No results found for: LABA1C  TSH:  No results found for: TSH  IRON:    Lab Results   Component Value Date/Time    IRON 53 06/11/2023 02:51 PM     Iron Saturation:  No components found for: PERCENTFE  TIBC:    Lab Results   Component Value Date/Time    TIBC 249 06/11/2023 02:51 PM     FERRITIN:  No results found for: FERRITIN  RPR:  No results found for: RPR  ANA:  No results found for: ANATITER, ANA     lab work on July 03, 2023 creatinine 1.46 GFR 55 sodium 139 potassium 4.7 A1c 7.3 microalbumin to creatinine ratio 6    IMAGING:    .SABRA     IMPRESSION:    1. Stage 3a chronic kidney disease (HCC)    2. Primary hypertension    3. Low vitamin D   level    4. Type 2 diabetes mellitus without complication, without long-term current use of insulin  (HCC)    5. Persistent proteinuria         PLAN:   #1 stage III chronic kidney disease GFR closer to 55 or acute renal failure on CKD 3 focus on hydration fluids avoid Aleve or NSAIDs for pain   #2 blood pressure overall stable slightly increased today but also anxious being here   #3 start on vitamin D  therapy   #4 monitor control glucose maintain on ACE inhibitor to help with proteinuria   #5 minimal proteinuria at this time supportive care hydration and monitor    INSTRUCTIONS:   #1 low-salt diet   #2 stay hydrated monitor blood pressure and volume status   #3 start on vitamin D  therapy once weekly   #4 avoid Aleve for pain   #5 increase fluid intake   #6 control sugar   #7  maintain current care and follow-up with lab work in 2 months      Thank you for letting me help in the care of your patient and please feel free to call me if any concerns or questions.    Electronically signed by ALRIC DELENA SNOW, MD on 08/11/2023 at 2:22 PM

## 2023-10-09 LAB — CBC WITH AUTO DIFFERENTIAL: Hemoglobin: 13.9 g/dL (ref 13.5–17.5)

## 2023-10-09 LAB — ALBUMIN: Albumin: 4.7 g/dL

## 2023-10-09 LAB — PHOSPHORUS: Phosphorus: 2.8 mg/dL

## 2023-10-13 ENCOUNTER — Encounter: Admit: 2023-10-13 | Payer: MEDICAID | Admitting: Nephrology | Primary: Internal Medicine

## 2023-10-13 VITALS — BP 124/82 | HR 104 | Ht 69.0 in | Wt 123.8 lb

## 2023-10-13 DIAGNOSIS — N1831 Chronic kidney disease, stage 3a (HCC): Principal | ICD-10-CM

## 2023-10-13 NOTE — Progress Notes (Signed)
 Patient:   Joe Avery    Date:  10/13/23  DOB:  [birth year],   Nephrologist: Karilyn Cota, MD  Provider: Rande Brunt, MD    Chief Complaint/Reason for Consult:  Evaluate proteinuria and renal insufficiency      HISTORY OF PRESENT ILLNESS:   The pat

## 2023-10-13 NOTE — Patient Instructions (Signed)
 INSTRUCTIONS:   #1 low-salt diet   #2 stay hydrated monitor blood pressure and volume status   #3 check vit d and pth   #4 avoid Aleve for pain   #5 increase fluid intake   #6 control sugar   #79fu 3 months

## 2023-10-14 ENCOUNTER — Encounter: Admit: 2023-10-14 | Admitting: Nephrology

## 2023-10-14 DIAGNOSIS — N1831 Chronic kidney disease, stage 3a (HCC): Secondary | ICD-10-CM

## 2023-10-21 NOTE — Telephone Encounter (Signed)
 Called pt. In regards to a referral for a colon screening. Lm for pt to call back to make an appt.

## 2023-11-30 ENCOUNTER — Encounter: Payer: MEDICAID | Attending: Registered Nurse | Primary: Internal Medicine

## 2023-11-30 NOTE — Telephone Encounter (Signed)
Pt called to cancel appt. For today, rescheduled for 12/29/23

## 2023-12-12 ENCOUNTER — Emergency Department: Admit: 2023-12-12 | Payer: MEDICAID | Primary: Internal Medicine

## 2023-12-12 ENCOUNTER — Inpatient Hospital Stay
Admission: EM | Admit: 2023-12-12 | Discharge: 2023-12-13 | Disposition: A | Discharge status: Home / Self Care | Payer: MEDICAID | Admitting: Student in an Organized Health Care Education/Training Program

## 2023-12-12 DIAGNOSIS — R531 Weakness: Secondary | ICD-10-CM

## 2023-12-12 DIAGNOSIS — G5620 Lesion of ulnar nerve, unspecified upper limb: Secondary | ICD-10-CM

## 2023-12-12 DIAGNOSIS — G5621 Lesion of ulnar nerve, right upper limb: Principal | ICD-10-CM

## 2023-12-12 DIAGNOSIS — I10 Essential (primary) hypertension: Secondary | ICD-10-CM | POA: Diagnosis not present

## 2023-12-12 DIAGNOSIS — R29898 Other symptoms and signs involving the musculoskeletal system: Secondary | ICD-10-CM | POA: Diagnosis not present

## 2023-12-12 DIAGNOSIS — G930 Cerebral cysts: Secondary | ICD-10-CM | POA: Diagnosis not present

## 2023-12-12 DIAGNOSIS — N183 Chronic kidney disease, stage 3 unspecified: Secondary | ICD-10-CM | POA: Diagnosis not present

## 2023-12-12 DIAGNOSIS — I129 Hypertensive chronic kidney disease with stage 1 through stage 4 chronic kidney disease, or unspecified chronic kidney disease: Secondary | ICD-10-CM | POA: Diagnosis not present

## 2023-12-12 DIAGNOSIS — E1122 Type 2 diabetes mellitus with diabetic chronic kidney disease: Secondary | ICD-10-CM | POA: Diagnosis not present

## 2023-12-12 DIAGNOSIS — E119 Type 2 diabetes mellitus without complications: Secondary | ICD-10-CM | POA: Diagnosis not present

## 2023-12-12 LAB — CBC WITH AUTO DIFFERENTIAL
Basophils %: 1 % (ref 0–1)
Basophils Absolute: 0.1 10*3/uL
Eosinophils %: 1 % (ref 0.0–3.0)
Eosinophils Absolute: 0.11 10*3/uL
Hematocrit: 38.3 % — ABNORMAL LOW (ref 42.0–52.0)
Hemoglobin: 12.6 g/dL — ABNORMAL LOW (ref 13.5–18.0)
Immature Granulocytes %: 0 %
Immature Granulocytes Absolute: 0.02 10*3/uL
Lymphocytes %: 17 % — ABNORMAL LOW (ref 24–44)
Lymphocytes Absolute: 1.47 10*3/uL
MCH: 29.7 pg (ref 27.0–31.0)
MCHC: 32.9 g/dL (ref 32.0–36.0)
MCV: 90.3 fL (ref 78.0–100.0)
MPV: 11.5 fL — ABNORMAL HIGH (ref 7.5–11.1)
Monocytes %: 11 % — ABNORMAL HIGH (ref 0–4)
Monocytes Absolute: 0.94 10*3/uL
Neutrophils %: 69 % — ABNORMAL HIGH (ref 36–66)
Neutrophils Absolute: 5.95 10*3/uL
Platelets: 222 10*3/uL (ref 140–440)
RBC: 4.24 m/uL — ABNORMAL LOW (ref 4.60–6.20)
RDW: 12.9 % (ref 11.7–14.9)
WBC: 8.6 10*3/uL (ref 4.0–10.5)

## 2023-12-12 LAB — POCT GLUCOSE
Glucose: 121 mg/dL
POC Glucose: 121 mg/dL — ABNORMAL HIGH (ref 74–99)
POC Glucose: 108 mg/dL — ABNORMAL HIGH (ref 74–99)

## 2023-12-12 LAB — COMPREHENSIVE METABOLIC PANEL W/ REFLEX TO MG FOR LOW K
ALT: 12 U/L (ref 10–40)
AST: 11 U/L — ABNORMAL LOW (ref 15–37)
Albumin/Globulin Ratio: 2 (ref 1.1–2.2)
Albumin: 3.9 g/dL (ref 3.4–5.0)
Alkaline Phosphatase: 76 U/L (ref 40–129)
Anion Gap: 11 mmol/L (ref 9–17)
BUN: 22 mg/dL — ABNORMAL HIGH (ref 7–20)
CO2: 27 mmol/L (ref 21–32)
Calcium: 9.6 mg/dL (ref 8.3–10.6)
Chloride: 100 mmol/L (ref 99–110)
Creatinine: 1.2 mg/dL (ref 0.8–1.3)
Est, Glom Filt Rate: 63 mL/min/{1.73_m2} (ref >60)
Glucose: 112 mg/dL — ABNORMAL HIGH (ref 74–99)
Potassium: 4.4 mmol/L (ref 3.5–5.1)
Sodium: 137 mmol/L (ref 136–145)
Total Bilirubin: 0.3 mg/dL (ref 0.0–1.0)
Total Protein: 5.9 g/dL — ABNORMAL LOW (ref 6.4–8.2)

## 2023-12-12 LAB — PROTIME-INR
INR: 1
Protime: 13.2 s (ref 11.7–14.5)

## 2023-12-12 LAB — TROPONIN: Troponin, High Sensitivity: 13 ng/L (ref 0–22)

## 2023-12-12 MED ORDER — NORMAL SALINE FLUSH 0.9 % IV SOLN
0.9 | INTRAVENOUS | Status: DC | PRN
Start: 2023-12-12 — End: 2023-12-13

## 2023-12-12 MED ORDER — ONDANSETRON 4 MG PO TBDP
4 | Freq: Three times a day (TID) | ORAL | Status: DC | PRN
Start: 2023-12-12 — End: 2023-12-13

## 2023-12-12 MED ORDER — ONDANSETRON HCL 4 MG/2ML IJ SOLN
42 | Freq: Four times a day (QID) | INTRAMUSCULAR | Status: DC | PRN
Start: 2023-12-12 — End: 2023-12-13

## 2023-12-12 MED ORDER — ASPIRIN 300 MG RE SUPP
300 | Freq: Every day | RECTAL | Status: DC
Start: 2023-12-12 — End: 2023-12-13

## 2023-12-12 MED ORDER — GLUCAGON HCL (DIAGNOSTIC) 1 MG IJ SOLR
1 | INTRAMUSCULAR | Status: DC | PRN
Start: 2023-12-12 — End: 2023-12-13

## 2023-12-12 MED ORDER — GLUCOSE 4 G PO CHEW
4 | ORAL | Status: DC | PRN
Start: 2023-12-12 — End: 2023-12-13

## 2023-12-12 MED ORDER — SODIUM CHLORIDE 0.9 % IV SOLN
0.9 | INTRAVENOUS | Status: DC | PRN
Start: 2023-12-12 — End: 2023-12-13

## 2023-12-12 MED ORDER — ASPIRIN 81 MG PO CHEW
81 | Freq: Every day | ORAL | Status: DC
Start: 2023-12-12 — End: 2023-12-13
  Administered 2023-12-13 (×2): 81 mg via ORAL

## 2023-12-12 MED ORDER — DEXTROSE 10 % IV BOLUS
INTRAVENOUS | Status: DC | PRN
Start: 2023-12-12 — End: 2023-12-13

## 2023-12-12 MED ORDER — LISINOPRIL 20 MG PO TABS
20 | Freq: Every day | ORAL | Status: DC
Start: 2023-12-12 — End: 2023-12-13

## 2023-12-12 MED ORDER — GABAPENTIN 100 MG PO CAPS
100 | Freq: Two times a day (BID) | ORAL | Status: DC
Start: 2023-12-12 — End: 2023-12-13

## 2023-12-12 MED ORDER — IOPAMIDOL 76 % IV SOLN
76 | Freq: Once | INTRAVENOUS | Status: AC | PRN
Start: 2023-12-12 — End: 2023-12-12
  Administered 2023-12-12: 18:00:00 75 mL via INTRAVENOUS

## 2023-12-12 MED ORDER — DEXTROSE 10 % IV SOLN
10 | INTRAVENOUS | Status: DC | PRN
Start: 2023-12-12 — End: 2023-12-13

## 2023-12-12 MED ORDER — AMLODIPINE BESYLATE 10 MG PO TABS
10 | Freq: Every day | ORAL | Status: DC
Start: 2023-12-12 — End: 2023-12-13

## 2023-12-12 MED ORDER — POLYETHYLENE GLYCOL 3350 17 G PO PACK
17 | Freq: Every day | ORAL | Status: DC | PRN
Start: 2023-12-12 — End: 2023-12-13

## 2023-12-12 MED ORDER — LABETALOL HCL 5 MG/ML IV SOLN
5 | INTRAVENOUS | Status: DC | PRN
Start: 2023-12-12 — End: 2023-12-13

## 2023-12-12 MED ORDER — INSULIN LISPRO 100 UNIT/ML IJ SOLN
100 | Freq: Four times a day (QID) | INTRAMUSCULAR | Status: DC
Start: 2023-12-12 — End: 2023-12-13

## 2023-12-12 MED ORDER — ENOXAPARIN SODIUM 40 MG/0.4ML IJ SOSY
400.4 | Freq: Every day | INTRAMUSCULAR | Status: DC
Start: 2023-12-12 — End: 2023-12-13
  Administered 2023-12-13: 02:00:00 40 mg via SUBCUTANEOUS

## 2023-12-12 MED ORDER — ATORVASTATIN CALCIUM 40 MG PO TABS
40 | Freq: Every evening | ORAL | Status: DC
Start: 2023-12-12 — End: 2023-12-13
  Administered 2023-12-13: 02:00:00 80 mg via ORAL

## 2023-12-12 MED ORDER — NORMAL SALINE FLUSH 0.9 % IV SOLN
0.9 | Freq: Two times a day (BID) | INTRAVENOUS | Status: DC
Start: 2023-12-12 — End: 2023-12-13
  Administered 2023-12-13 (×2): 10 mL via INTRAVENOUS

## 2023-12-12 MED FILL — ISOVUE-370 76 % IV SOLN: 76 % | INTRAVENOUS | Qty: 100

## 2023-12-12 MED FILL — ASPIRIN 300 MG RE SUPP: 300 MG | RECTAL | Qty: 1

## 2023-12-12 MED FILL — BD POSIFLUSH 0.9 % IV SOLN: 0.9 % | INTRAVENOUS | Qty: 40

## 2023-12-12 NOTE — ED Provider Notes (Signed)
Patient seen in collaboration with Arvella Nigh.  I discussed the case with the CNP and agree with plan of care.  I also independently examined the patient myself.  Briefly this patient states he has had many "TIAs" and comes in today to the emergency department with complaints of right hand weakness.  He states he noticed this at 10 PM last night.  He noticed it when he tried to grab drink with the right hand and then the acute left to drink with his hand out of hand keep grabbing.  Does not admit to any other weakness and denies visual change or acute gait disturbance or loss of sensation or headache.  Patient states he fell 3 years ago in his back and since then has had some gait disturbance which has not changed.  Patient denies dysuria, chest or abdominal pains.  He denies neck pain.  Patient denies fever or chills.  Patient is worked up for stroke.    Patient's lab workup do not feel acute findings.  CT head is unremarkable.  Chest x-ray is also unremarkable except for incidental finding of what appears to be arachnoid cyst versus prominent cisterna magna in the posterior fossa measuring 2.3 x 1.8 x 2.2 cm.  CTA head and neck does not reveal any major vessel occlusion or dissection in the head or neck.  There is less than 50% stenosis of the proximal right internal carotid artery.  Patient is now admitted by the hospitalist for further care for his right hand weakness     Conley Simmonds, MD  12/12/23 1432

## 2023-12-12 NOTE — Code Documentation (Signed)
Dr. Chelsea Aus at bedside assessing pt at this time.

## 2023-12-12 NOTE — Progress Notes (Signed)
4 Eyes Skin Assessment     NAME:  Joe Avery  DATE OF BIRTH:  October 14, 1961  MEDICAL RECORD NUMBER:  [id number]    The patient is being assessed for  Admission    I agree that at least one RN has performed a thorough Head to Toe Skin Assessment on the patient. ALL assessment sites listed below have been assessed.      Areas assessed by both nurses:    Head, Face, Ears, Shoulders, Back, Chest, Arms, Elbows, Hands, Sacrum. Buttock, Coccyx, Ischium, and Legs. Feet and Heels        Does the Patient have a Wound? No noted wound(s)       Braden Prevention initiated by RN: No  Wound Care Orders initiated by RN: No    Pressure Injury (Stage 3,4, Unstageable, DTI, NWPT, and Complex wounds) if present, place Wound referral order by RN under ORDER ENTRY: No    New Ostomies, if present place, Ostomy referral order under ORDER ENTRY: No     Nurse 1 eSignature: Electronically signed by Aleene Davidson, RN on 12/12/23 at 6:58 PM EST    **SHARE this note so that the co-signing nurse can place an eSignature**    Nurse 2 eSignature: Electronically signed by Eliezer Champagne, RN on 12/12/23 at 7:07 PM EST

## 2023-12-12 NOTE — ED Notes (Signed)
BG 121

## 2023-12-12 NOTE — ED Notes (Signed)
1191 notified Gabriel Rung on 3E that patient SBAR is complete.

## 2023-12-12 NOTE — Progress Notes (Signed)
 Pt needs to have MRI Questionnaire completed in order for pt to have MRI performed.

## 2023-12-12 NOTE — ED Notes (Signed)
ED TO INPATIENT SBAR HANDOFF    Patient Name: Joe Avery   DOB:  1961/10/20  63 y.o.   Preferred Name  Joe Avery  Family/Caregiver Present no   Restraints no   C-SSRS: Risk of Suicide: No Risk  Sitter no   Sepsis Risk Score        Situation  Chief Complaint   Patient presents with    Extremity Weakness     Right sided weakness, LKW 2100 last night, BG-122       Brief Description of Patient's Condition: pt presented to the ED with complaints of right sided weakness that began last night around 2100. Pt states that he had a hx of a TIA in the past.  Mental Status: oriented  Arrived from: home    Imaging:   CTA HEAD NECK W CONTRAST   Final Result      CT HEAD WO CONTRAST   Final Result        Abnormal labs:   Abnormal Labs Reviewed   CBC WITH AUTO DIFFERENTIAL - Abnormal; Notable for the following components:       Result Value    RBC 4.24 (*)     Hemoglobin 12.6 (*)     Hematocrit 38.3 (*)     MPV 11.5 (*)     Neutrophils % 69 (*)     Lymphocytes % 17 (*)     Monocytes % 11 (*)     All other components within normal limits   COMPREHENSIVE METABOLIC PANEL W/ REFLEX TO MG FOR LOW K - Abnormal; Notable for the following components:    Glucose 112 (*)     BUN 22 (*)     Total Protein 5.9 (*)     AST 11 (*)     All other components within normal limits   POCT GLUCOSE - Abnormal; Notable for the following components:    POC Glucose 121 (*)     All other components within normal limits        Background  History:   Past Medical History:   Diagnosis Date    Hypertension     Type 2 diabetes mellitus without complication, without long-term current use of insulin (HCC) 08/11/2023       Assessment    Vitals: MEWS Score: 0  Level of Consciousness: Alert (0)   Vitals:    12/12/23 1311 12/12/23 1330 12/12/23 1400 12/12/23 1430   BP:  (!) 131/97 117/82 138/83   Pulse: (!) 106 90 94 97   Resp:       Temp:       TempSrc:       SpO2: 100% 99% 97% 100%   Weight:       Height:           PO Status: Regular    O2 Flow Rate: O2 Device: None (Room  air)      Cardiac Rhythm: Sinus tachy     Last documented pain medication administered:     NIH Score: NIH NIH Stroke Scale  NIH Stroke Scale Assessed: (S) Yes  Interval: Baseline  Level of Consciousness (1a): Alert  LOC Questions (1b): Answers both correctly  LOC Commands (1c): Performs both tasks correctly  Best Gaze (2): Normal  Visual (3): No visual loss  Facial Palsy (4): Normal symmetrical movement  Motor Arm, Left (5a): No drift  Motor Arm, Right (5b): Drift, but does not hit bed  Motor Leg, Left (6a): No drift  Motor Leg,  Right (6b): Drift, but does not hit bed  Limb Ataxia (7): Absent  Sensory (8): Normal  Best Language (9): No aphasia  Dysarthria (10): Normal  Extinction and Inattention (11): No abnormality  Total: 2     Active LDA's:      Pertinent or High Risk Medications/Drips: no   If Yes, please provide details:       Blood Product Administration: no  If Yes, please provide details:     Recommendation    Incomplete orders   Additional Comments:    If any further questions, please call Sending RN at [phone].    Electronically signed by: Electronically signed by Ralene Ok, RN on 12/12/2023 at 2:47 PM

## 2023-12-12 NOTE — H&P (Signed)
V2.0  History and Physical      Name:  Joe Avery DOB/Age/Sex: Jan 24, 1961  (64 y.o. male)   MRN & CSN:  [id number] & [id number] Encounter Date/Time: 12/12/2023 3:07 PM EST   Location:  3010/3010-A PCP: Rande Brunt, MD       Hospital Day: 1    Assessment and Plan:   Joe Avery is a 63 y.o. male who presented with stroke-like symptoms    Hospital Problems             Last Modified POA    * (Principal) Stroke-like symptoms 12/12/2023 Yes       Plan:    Right arm weakness and left facial droop  LKW 9pm on 12/11/23. NIH 3 on presentation. 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 CTA head/neck without LVO or dissection.  Aspirin, statin initiated  Every 4 hours neurochecks  SLP, PT/OT.  Bedside swallow evaluation prior to advancing diet  MRI brain without contrast ordered  Neurology consulted  Permissive hypertension today, use as needed labetalol for SBP greater than 220 or DBP greater than 120 with hold parameters for heart rate  Hold home gabapentin    Hypertension  Holding home amlodipine and lisinopril to allow for permissive hypertension    Diabetes mellitus type II  Glucose checks, SSI, hypoglycemia protocol    CKD stage 3a  Monitor renal panel  Continue follow up with nephrologist outpatient    Disposition:   Current Living situation: Home  Expected Disposition: Home  Estimated D/C: 1-2 days    Diet Diet NPO   DVT Prophylaxis [x]  Lovenox, []   Heparin, []  SCDs, []  Ambulation,  []  Eliquis, []  Xarelto, []  Coumadin   Code Status Discussed CODE STATUS extensively with patient with family at bedside and patient reports he wishes to be Full Code   Surrogate Decision Maker/ POA      Personally reviewed Lab Studies and Imaging     Discussed management of the case with ER provider who recommended admission    Imaging that was interpreted personally includes as above    History from:     patient, electronic medical record    History of Present  Illness:     Chief Complaint: stroke-like symptoms  Joe Avery is a 63 y.o. male who presents with right arm weakness since 9pm last night. Pt was trying to get a drink and noted he was unable to hold the glass with his right hand.  He also notes left-sided facial droop for the past week.  Does have a hx of TIA.    Continues to experience right arm weakness and mild left sided facial droop.     Review of Systems:        Pertinent positives and negatives discussed in HPI     Objective:   No intake or output data in the 24 hours ending 12/12/23 1850   Vitals:   Vitals:    12/12/23 1330 12/12/23 1400 12/12/23 1430 12/12/23 1624   BP: (!) 131/97 117/82 138/83 132/84   Pulse: 90 94 97 88   Resp:    22   Temp:    98.2 F (36.8 C)   TempSrc:    Oral   SpO2: 99% 97% 100%    Weight:       Height:           Personally Reviewed Medications Prior to Admission  Prior to Admission medications    Medication Sig Start Date End Date Taking? Authorizing Provider   metFORMIN (GLUCOPHAGE) 500 MG tablet Take 1 tablet by mouth 2 times daily (with meals) 08/12/23  Yes [provider]   gabapentin (NEURONTIN) 100 MG capsule Take 1 capsule by mouth in the morning and at bedtime. 08/06/23 08/05/24 Yes Stephan Minister, APRN - CNP   lisinopril (PRINIVIL;ZESTRIL) 40 MG tablet Take 1 tablet by mouth daily 02/02/23 02/02/24 Yes [provider]   raNITIdine HCl (RANITIDINE 150 MAX STRENGTH PO) Take by mouth as needed    [provider]   Aspirin-Acetaminophen-Caffeine (EXCEDRIN PO) Take by mouth as needed    [provider]   vitamin D (ERGOCALCIFEROL) 1.25 MG (50000 UT) CAPS capsule Take 1 capsule by mouth once a week  Patient not taking: Reported on 12/12/2023 08/11/23   Karilyn Cota, MD   amLODIPine (NORVASC) 10 MG tablet Take 1 tablet by mouth daily 02/02/23 02/02/24  [provider]   aspirin 325 MG EC tablet Take 1 tablet by mouth as needed  Patient not taking: Reported on 12/12/2023     [provider]       Physical Exam:    Physical Exam     General: NAD  Eyes: EOMI  ENT: neck supple  Cardiovascular: Regular rate.  Respiratory: Clear to auscultation  Gastrointestinal: Soft, non tender  Genitourinary: no suprapubic tenderness  Musculoskeletal: No edema  Skin: warm, dry  Neuro: Alert. Mild loss of left-sided nasolabial folds.  Strength 4/5 in right upper extremity and 5/5 in left upper extremity.  Strength 4+/5 in bilateral lower extremities.  Sensation intact throughout.  Mild dysmetria present in RUE.  Psych: Mood appropriate.       Past Medical History:   PMHx   Past Medical History:   Diagnosis Date    Hypertension     Type 2 diabetes mellitus without complication, without long-term current use of insulin (HCC) 08/11/2023     PSHX:  has no past surgical history on file.  Allergies: No Known Allergies  Fam HX:  family history includes Diabetes in his father.  Soc HX:   Social History     Socioeconomic History    Marital status: Single     Spouse name: None    Number of children: None    Years of education: None    Highest education level: None   Tobacco Use    Smoking status: Every Day     Types: Cigarettes    Smokeless tobacco: Never   Substance and Sexual Activity    Drug use: Not Currently     Social Determinants of Health     Food Insecurity: No Food Insecurity (12/12/2023)    Hunger Vital Sign     Worried About Running Out of Food in the Last Year: Never true     Ran Out of Food in the Last Year: Never true   Transportation Needs: No Transportation Needs (12/12/2023)    PRAPARE - Therapist, art (Medical): No     Lack of Transportation (Non-Medical): No   Housing Stability: Low Risk  (12/12/2023)    Housing Stability Vital Sign     Unable to Pay for Housing in the Last Year: No     Number of Times Moved in the Last Year: 0     Homeless in the Last Year: No       Medications:   Medications:    [  Held by provider] amLODIPine  10 mg Oral Daily    [Held by provider]  gabapentin  100 mg Oral BID    [Held by provider] lisinopril  40 mg Oral Daily    sodium chloride flush  5-40 mL IntraVENous 2 times per day    enoxaparin  40 mg SubCUTAneous Daily    atorvastatin  80 mg Oral Nightly    aspirin  81 mg Oral Daily    Or    aspirin  300 mg Rectal Daily    insulin lispro  0-4 Units SubCUTAneous 4x Daily AC & HS      Infusions:    sodium chloride      dextrose       PRN Meds: sodium chloride flush, 5-40 mL, PRN  sodium chloride, , PRN  ondansetron, 4 mg, Q8H PRN   Or  ondansetron, 4 mg, Q6H PRN  polyethylene glycol, 17 g, Daily PRN  labetalol, 10 mg, Q4H PRN  glucose, 4 tablet, PRN  dextrose bolus, 125 mL, PRN   Or  dextrose bolus, 250 mL, PRN  glucagon (rDNA), 1 mg, PRN  dextrose, , Continuous PRN        Labs      CBC:   Recent Labs     12/12/23  1240   WBC 8.6   HGB 12.6*   PLT 222     BMP:    Recent Labs     12/12/23  1240 12/12/23  1245   NA 137  --    K 4.4  --    CL 100  --    CO2 27  --    BUN 22*  --    CREATININE 1.2  --    GLUCOSE 112* 121     Hepatic:   Recent Labs     12/12/23  1240   AST 11*   ALT 12   BILITOT 0.3   ALKPHOS 76     Lipids: No results found for: "CHOL", "HDL", "TRIG"  Hemoglobin A1C: No results found for: "LABA1C"  TSH: No results found for: "TSH"  Troponin: No results found for: "TROPONINT"  Lactic Acid: No results for input(s): "LACTA" in the last 72 hours.  BNP: No results for input(s): "PROBNP" in the last 72 hours.  UA:No results found for: "NITRU", "COLORU", "PHUR", "LABCAST", "WBCUA", "RBCUA", "MUCUS", "TRICHOMONAS", "YEAST", "BACTERIA", "CLARITYU", "SPECGRAV", "LEUKOCYTESUR", "UROBILINOGEN", "BILIRUBINUR", "BLOODU", "GLUCOSEU", "KETUA", "AMORPHOUS"  Urine Cultures: No results found for: "LABURIN"  Blood Cultures: No results found for: "BC"  No results found for: "BLOODCULT2"  Organism: No results found for: "ORG"    Imaging/Diagnostics Last 24 Hours   CTA HEAD NECK W CONTRAST    Result Date: 12/12/2023  CT ANGIOGRAM OF THE HEAD AND NECK Date of service:  12/12/2023 13:29 Reason for exam: Stroke Symptoms Comparison: Noncontrast CT of the head from 12/12/2023. Technique: Helical images were obtained in the axial plane during the rapid administration of intravenous contrast. The exam was timed to best evaluate and opacify the arterial structures. The examination is reviewed at  soft tissue, and lung windows. 2D and 3D reconstructions are performed of the target arterial  structures.  CT radiation dose optimization techniques (automated exposure control, and use of iterative reconstruction techniques, or adjustment of the mA  or kV according to patient size) were used to limit patient radiation dose. IOPAMIDOL 76 % IV SOLN Limitations: None. FINDINGS: Brain: Incidental note is made of a small arachnoid cyst versus a prominent cisterna magna  in the posterior fossa. Otherwise no abnormalities in the arterial phase. Soft Tissues: No significant soft tissue findings in the neck or upper chest. Upper Lungs:  Mild paraseptal emphysema. No nodules or other significant lung pathology. Aortic Arch: Classic origin of the brachiocephalic vessels. No significant abnormalities in the common carotid or subclavian arteries. Right Cervical Carotid: Normal appearing carotid bifurcation. Less than 50% stenosis of the proximal right internal carotid artery. No dissection. Left Cervical Carotid: Normal appearing Carotid bifurcations. No evidence of significant stenosis or dissection. Vertebrals: Patent vessels with no evidence of significant stenosis. Intracranial Vasculature:  No evidence of aneurysm, vascular malformation, or major branch occlusion. IMPRESSION: 1. No major vessel occlusion or dissection in the head or neck. 2. Less than 50% stenosis of the proximal right internal carotid artery. These findings were discussed with Arvella Nigh, NP in the [facility] Department at 1314 hours on 12/12/2023.  Dictated and Electronically Signed By: Adria Dill, DO 12/12/2023 13:34         CT HEAD WO CONTRAST    Result Date: 12/12/2023  CT OF THE HEAD WITHOUT CONTRAST DATE OF SERVICE: 12/12/2023 13:25 DEMOGRAPHICS: 63 years old Male INDICATION: Stroke Symptoms COMPARISON:  None TECHNIQUE: Axial images were obtained of the brain without the administration of  intravenous contrast. Coronal reconstructions were obtained. Coronal reconstructions were obtained. CT radiation dose optimization techniques (automated exposure control, and use of iterative reconstruction techniques, or adjustment of the mA or kV according to patient size) were used to limit patient  radiation dose. FINDINGS: There is no soft tissue swelling. The osseous structures are intact. The paranasal sinuses and mastoid air cells are well aerated. 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 No additional intra-axial or extra-axial fluid collections or masses are identified. There is no mass effect or midline shift. There is no acute major vascular territorial infarct, intracranial hemorrhage, or mass. There is no hydrocephalus. Mild nonspecific white matter disease. Mild atrophy. IMPRESSION: 1. No acute major vascular territorial infarct or intracranial hemorrhage. 2. Mild nonspecific white matter disease. 3. 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 These findings were discussed with Arvella Nigh, NP in the [facility] Emergency Department at 1314 hours on 12/12/2023.  Dictated and Electronically Signed By: Adria Dill, DO 12/12/2023 13:28            Electronically signed by Marcy Panning, MD on 12/12/2023 at 6:50 PM

## 2023-12-12 NOTE — ED Provider Notes (Addendum)
[facility]  EMERGENCY DEPARTMENT ENCOUNTER      Pt Name: Joe Avery  MRN: [id number]  Birthdate 12-28-1960  Date of evaluation: 12/12/2023  Provider: Verne Carrow, APRN - CNP  PCP: Rande Brunt, MD  Note Started: 12:22 PM EST 12/12/23    I am the Primary Clinician of Record.   I have seen and evaluated this patient with my supervising physician Conley Simmonds, MD.    CHIEF COMPLAINT       Chief Complaint   Patient presents with    Extremity Weakness     Right sided weakness, LKW 2100 last night, BG-122         HISTORY OF PRESENT ILLNESS: 1 or more Elements   History from : Patient    Limitations to history : None    Joe Avery is a 63 y.o. male who presents to the emergency department with right sided weakness and left facial  drooping.  Onset last night around 9.00 pm last night.  He states he was trying to get a drink and found he could not hold the glass with his right hand.  He continues to have symptoms.  Reports previous tia.     Nursing Notes were all reviewed and agreed with or any disagreements were addressed in the HPI.    REVIEW OF SYSTEMS :    Review of Systems   Constitutional:  Negative for fatigue and fever.   Eyes:  Negative for visual disturbance.   Respiratory:  Negative for shortness of breath.    Gastrointestinal:  Negative for abdominal pain, constipation, nausea and vomiting.   Genitourinary:  Negative for dysuria.   Neurological:  Positive for facial asymmetry and weakness (right sided). Negative for tremors, syncope, speech difficulty, light-headedness, numbness and headaches.     Positives and Pertinent negatives as per HPI.     SURGICAL HISTORY   History reviewed. No pertinent surgical history.    CURRENTMEDICATIONS       Previous Medications    AMLODIPINE (NORVASC) 10 MG TABLET    Take 1 tablet by mouth daily    ASPIRIN 325 MG EC TABLET    Take 1 tablet by mouth as needed    ASPIRIN-ACETAMINOPHEN-CAFFEINE (EXCEDRIN PO)    Take by mouth as  needed    GABAPENTIN (NEURONTIN) 100 MG CAPSULE    Take 1 capsule by mouth in the morning and at bedtime.    LISINOPRIL (PRINIVIL;ZESTRIL) 40 MG TABLET    Take 1 tablet by mouth daily    METFORMIN (GLUCOPHAGE) 500 MG TABLET    Take 1 tablet by mouth 2 times daily (with meals)    RANITIDINE HCL (RANITIDINE 150 MAX STRENGTH PO)    Take by mouth as needed    VITAMIN D (ERGOCALCIFEROL) 1.25 MG (50000 UT) CAPS CAPSULE    Take 1 capsule by mouth once a week       ALLERGIES     Patient has no known allergies.    FAMILYHISTORY       Family History   Problem Relation Age of Onset    Diabetes Father         SOCIAL HISTORY       Social History     Tobacco Use    Smoking status: Every Day     Types: Cigarettes    Smokeless tobacco: Never   Substance Use Topics    Drug use: Not Currently       SCREENINGS   NIH  Stroke Scale  NIH Stroke Scale Assessed: (S) Yes  Interval: Baseline  Level of Consciousness (1a): Alert  LOC Questions (1b): Answers both correctly  LOC Commands (1c): Performs both tasks correctly  Best Gaze (2): Normal  Visual (3): No visual loss  Facial Palsy (4): Normal symmetrical movement  Motor Arm, Left (5a): No drift  Motor Arm, Right (5b): Drift, but does not hit bed  Motor Leg, Left (6a): No drift  Motor Leg, Right (6b): Drift, but does not hit bed  Limb Ataxia (7): Absent  Sensory (8): Normal  Best Language (9): No aphasia  Dysarthria (10): Normal  Extinction and Inattention (11): No abnormality  Total: 2      Glasgow Coma Scale  Eye Opening: Spontaneous  Best Verbal Response: None  Best Motor Response: Obeys commands  Glasgow Coma Scale Score: 15                        CIWA Assessment  BP: 138/83  Pulse: 97             PHYSICAL EXAM  1 or more Elements     ED Triage Vitals   BP Systolic BP Percentile Diastolic BP Percentile Temp Temp src Pulse Resp SpO2   -- -- -- -- -- -- -- --      Height Weight         -- --           Physical Exam    Constitutional:  Well developed, Well nourished.  No distress  HENT:  Pupils are equally round and reactive to light. EOM.  Sclerae white with no injection non- icterus. Nose without any rhinorrhea or epistaxis. Oral mucosa is moist.   Normal hearing.   Neck: Supple, full range of motion, trachea midline  Cardiac:  Regular rate / rhythm. No murmurs, rubs, clicks or gallops.  Equal bilateral radial pulses.  Lungs: Respirations non-labored. Lungs are clear to auscultation.  Abdomen:  Abdomen is soft nontender nondistended.   Musculoskeletal: 5 out of 5 strength in all 4 extremities. Full flexion, extension, abduction, adduction, supination  and pronation of all extremities and all digits. No obvious muscle atrophy is noted. There is no edema, asymmetry, or calf / thigh tenderness bilaterally. No cyanosis. Bilateral upper and lower extremity ROM intact without pain or obvious deficit  Integument: Skin is warm and dry.  There is no obvious abscesses, lacerations, lesions or rash noted.  Neurology:  Alert & oriented , No focal deficits noted.   Cranial nerves II through XII grossly intact.   Normal gross motor coordination & motor strength bilateral upper and lower extremities  Sensation intact. No evidence of incontinence or loss of bowel or bladder, no saddle anesthesia noted   Lymphatic: There is no submandibular or cervical adenopathy appreciated.  Psychiatric: Mentation is grossly normal cognition is grossly normal. Affect is appropriate    DIAGNOSTIC RESULTS   LABS:    Labs Reviewed   CBC WITH AUTO DIFFERENTIAL - Abnormal; Notable for the following components:       Result Value    RBC 4.24 (*)     Hemoglobin 12.6 (*)     Hematocrit 38.3 (*)     MPV 11.5 (*)     Neutrophils % 69 (*)     Lymphocytes % 17 (*)     Monocytes % 11 (*)     All other components within normal limits   COMPREHENSIVE METABOLIC PANEL W/ REFLEX  TO MG FOR LOW K - Abnormal; Notable for the following components:    Glucose 112 (*)     BUN 22 (*)     Total Protein 5.9 (*)     AST 11 (*)     All other components  within normal limits   POCT GLUCOSE - Abnormal; Notable for the following components:    POC Glucose 121 (*)     All other components within normal limits   POCT GLUCOSE - Normal   TROPONIN   PROTIME-INR     I have reviewed and interpreted all of the currently available lab results from this visit (if applicable) Only abnormal lab results are displayed. All other labs were within normal range or not returned as of this dictation.    EKG: EKG  shows sinus tachycardia with a ventricular rate of 101 bpm.  Right axis deviation.  There is no  evidence of ischemia.  No EKG available for comparison.  When ordered, EKG's are interpreted by myself as well as the Emergency Department Physician in the absence of a cardiologist.  Please see their note for interpretation of EKG.    RADIOLOGY: Non-plain film images such as CT, Ultrasound and MRI are read by the radiologist. I, Arvella Nigh, APRN - CNP have directly visualized the radiologic plain film image(s) with the below findings read by radiologist and agree with interpretation:    Interpretation per the Radiologist below, if available at the time of this note:    CTA HEAD NECK W CONTRAST   Final Result      CT HEAD WO CONTRAST   Final Result          PROCEDURES   Unless otherwise noted below, none     Procedures    CRITICAL CARE TIME     There was a high probability of clinically significant life-threatening deterioration of the patient's condition requiring my urgent intervention due to concern for CVA.  Total critical care time is at least  30 minutes.    This includes vital sign monitoring, pulse oximetry monitoring, telemetry monitoring, clinical response to the IV medications, reviewing the nursing notes, consultation time, dictation/documentation time, and interpretation of the lab work. This time excludes time spent performing procedures and separately billable procedures and family discussion time.     PAST MEDICAL HISTORY   Chronic Conditions:  has a past  medical history of Hypertension and Type 2 diabetes mellitus without complication, without long-term current use of insulin (HCC) (08/11/2023).     CC/HPI Summary, DDx, ED Course, and Reassessment:       06/11/2023: seen by neuro for bilateral lower extremity EMG to assess generalized weakness that he has been experiencing for approximately 2 years. He states that 2 years and 4 months ago he had a fall. The day following is when all of his symptoms began. He states that he has had significant difficulties in ambulation and generally feeling very weak. Diagnosed with idopathic peripheral neuropathy    Chart review shows recent radiograph(s):  No results found.    Vitals:    Vitals:    12/12/23 1311 12/12/23 1330 12/12/23 1400 12/12/23 1430   BP:  (!) 131/97 117/82 138/83   Pulse: (!) 106 90 94 97   Resp:       Temp:       TempSrc:       SpO2: 100% 99% 97% 100%   Weight:       Height:  Patient seen and examined.  Work-up initiated secondary to presentation, physical exam findings, vital signs and medical chart review. Emergent etiologies considered.    Differential diagnosis include, but not limited to, CVA, TIA    Joe Avery is a 63 y.o. male who present to the emergency center .   Pt has easy nonlabored respirations.  Vital signs within acceptable parameters.  Patient is afebrile and in no acute distress. Pt hemodynamically stable and neurovascularly intact.    Is this patient to be included in the SEP-1 Core Measure due to severe sepsis or septic shock?   No   Exclusion criteria - the patient is NOT to be included for SEP-1 Core Measure due to:  2+ SIRS criteria are not met     Joe Avery NIH Stroke Scale at 3:56 PM is:  Level of Consciousness:  0 - alert; keenly responsive    LOC Questions:  0 - answers both questions correctly    LOC Commands:  0 - performs both tasks correctly    Best Gaze:  0 - normal    Visual Fields:  0 - no visual loss    Facial Palsy:  1 - minor paralysis (flattened  nasolabial fold, asymmetric on smiling)    Motor-Arm-Left:  0 - no drift, limb holds 90 (or 45) degrees for full 10 seconds    Motor-Leg-Left:  0 - no drift; leg holds 30 degree position for full 5 seconds    Motor-Arm-Right:  0 - no drift, limb holds 90 (or 45) degrees for full 10 seconds    Motor-Leg-Right:  0 - no drift; leg holds 30 degree position for full 5 seconds    Limb Ataxia:  0 - absent    Sensory:  1 - mild to moderate sensory loss; patient feels pinprick is less sharp or is dull on the affected side; there is a loss of superficial pain with pinprick but patient is aware of being touched     Best Language:  0 - no aphasia, normal    Dysarthria:  0 - normal    Extinction and Inattention:  1 - visual, tactile, auditory, spatial or personal inattention or extinction to bilateral simultaneous stimulation in one of the sensory modalities      Patient was treated the following medications:  Medications   iopamidol (ISOVUE-370) 76 % injection 75 mL (75 mLs IntraVENous Given 12/12/23 1233)      CT the head shows a arachnoid cyst versus a prominent cisterna magna in the posterior fossa.  Otherwise no acute infarct or hemorrhage.  CTA head and neck is unremarkable. Labs are unremarkable.  The patient's last known well was at 9 PM last night.  While in the emergency department his symptoms have improved.  Will hospitalize for MRI     CONSULTS: (Who and What was discussed)  IP CONSULT TO NEUROLOGY  IP CONSULT TO CASE MANAGEMENT            Disposition Considerations (include 1 Tests not done, Shared Decision Making, Pt Expectation of Test or Tx.): Escalation of care considered for admission/observation. Shared decision making with patient/family engaged.  Based on that discussion, I will admit for further treatment and management.  I have discussed Joe Avery ED presentation and ED course with Dr. Kerry Dory from the IM service, via perfect serve. . Based on that discussion we have decided to admit Joe Avery to  their service  for further evaluation and care.     FINAL IMPRESSION  1. Right sided weakness    2. Arachnoid cyst          DISPOSITION/PLAN     DISPOSITION Admitted 12/12/2023 03:17:38 PM         PATIENT REFERRED TO:  No follow-up provider specified.    DISCHARGE MEDICATIONS:  New Prescriptions    No medications on file       DISCONTINUED MEDICATIONS:  Discontinued Medications    No medications on file            (Please note that portions of this note were completed with a voice recognition program.  Efforts were made to edit the dictations but occasionally words are mis-transcribed.)    Verne Carrow, APRN - CNP (electronically signed)      Verne Carrow, APRN - CNP  12/12/23 1558       Verne Carrow, APRN - CNP  12/17/23 1554

## 2023-12-13 ENCOUNTER — Inpatient Hospital Stay: Admit: 2023-12-13 | Payer: MEDICAID | Primary: Internal Medicine

## 2023-12-13 LAB — BASIC METABOLIC PANEL W/ REFLEX TO MG FOR LOW K
Anion Gap: 11 mmol/L (ref 9–17)
BUN: 23 mg/dL — ABNORMAL HIGH (ref 7–20)
CO2: 24 mmol/L (ref 21–32)
Calcium: 9.5 mg/dL (ref 8.3–10.6)
Chloride: 105 mmol/L (ref 99–110)
Creatinine: 1.1 mg/dL (ref 0.8–1.3)
Est, Glom Filt Rate: 66 mL/min/{1.73_m2} (ref >60)
Glucose: 150 mg/dL — ABNORMAL HIGH (ref 74–99)
Potassium: 4.1 mmol/L (ref 3.5–5.1)
Sodium: 140 mmol/L (ref 136–145)

## 2023-12-13 LAB — LIPID PANEL
Cholesterol, Total: 127 mg/dL (ref 125–199)
HDL: 38 mg/dL — ABNORMAL LOW (ref >40)
LDL Cholesterol: 72 mg/dL (ref <101)
Triglycerides: 86 mg/dL (ref <150)

## 2023-12-13 LAB — POCT GLUCOSE: POC Glucose: 150 mg/dL — ABNORMAL HIGH (ref 74–99)

## 2023-12-13 LAB — CBC
Hematocrit: 39.6 % — ABNORMAL LOW (ref 42.0–52.0)
Hemoglobin: 12.7 g/dL — ABNORMAL LOW (ref 13.5–18.0)
MCH: 29.5 pg (ref 27.0–31.0)
MCHC: 32.1 g/dL (ref 32.0–36.0)
MCV: 91.9 fL (ref 78.0–100.0)
MPV: 11.2 fL — ABNORMAL HIGH (ref 7.5–11.1)
Platelets: 225 10*3/uL (ref 140–440)
RBC: 4.31 m/uL — ABNORMAL LOW (ref 4.60–6.20)
RDW: 13 % (ref 11.7–14.9)
WBC: 5.9 10*3/uL (ref 4.0–10.5)

## 2023-12-13 LAB — HEMOGLOBIN A1C
Estimated Avg Glucose: 166 mg/dL
Hemoglobin A1C: 7.4 % — ABNORMAL HIGH (ref 4.2–6.3)

## 2023-12-13 MED ORDER — ASPIRIN 81 MG PO CHEW
81 | ORAL_TABLET | Freq: Every day | ORAL | 3 refills | Status: AC
Start: 2023-12-13 — End: ?

## 2023-12-13 MED ORDER — ATORVASTATIN CALCIUM 40 MG PO TABS
40 | ORAL_TABLET | Freq: Every evening | ORAL | 3 refills | Status: AC
Start: 2023-12-13 — End: ?

## 2023-12-13 MED ORDER — ATORVASTATIN CALCIUM 40 MG PO TABS
40 | Freq: Every evening | ORAL | Status: DC
Start: 2023-12-13 — End: 2023-12-13

## 2023-12-13 MED FILL — ENOXAPARIN SODIUM 40 MG/0.4ML IJ SOSY: 400.4 MG/0.4ML | INTRAMUSCULAR | Qty: 0.4

## 2023-12-13 MED FILL — ASPIRIN LOW DOSE 81 MG PO CHEW: 81 MG | ORAL | Qty: 1

## 2023-12-13 MED FILL — LIPITOR 40 MG PO TABS: 40 MG | ORAL | Qty: 2

## 2023-12-13 NOTE — Discharge Summary (Signed)
V2.0  Discharge Summary    Name:  Joe Avery DOB/Age/Sex: [birth year] (63 y.o. male)   Admit Date: 12/12/2023  Discharge Date: 12/13/23    MRN & CSN:  [id number] & [id number] Encounter Date and Time 12/13/23 6:30 PM EST    Attending:  No att. providers found Discharging Provider: Brayton Mars, MD       Hospital Course:     Brief HPI: Joe Avery is a 64 y.o. male  who presents with right arm weakness since 9pm last night. Pt was trying to get a drink and noted he was unable to hold the glass with his right hand.  He also notes left-sided facial droop for the past week.  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 NIH 3 on presentation. 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 CTA head/neck without LVO or dissection.  Aspirin, statin initiated  Neurology consulted  MRI brain without acute findings  Plan for outpatient follow-up with neurology for EMG     Hypertension  Continue home amlodipine and lisinopril     Diabetes mellitus type II  Continue home meds     CKD stage 3a  Continue follow up with nephrologist outpatient         The patient expressed appropriate understanding of, and agreement with the discharge recommendations, medications, and plan.     Consults this admission:  IP CONSULT TO NEUROLOGY  IP CONSULT TO CASE MANAGEMENT    Discharge Diagnosis:     Stroke-like symptoms      Discharge Instruction:   Follow up appointments: Neurology  Primary care physician: Rande Brunt, MD within 1 weeks  Diet: cardiac diet and diabetic diet   Activity: activity as tolerated  Disposition: Discharged to:   [x] Home, [] HHC, [] SNF, [] Acute Rehab, [] Hospice  Condition on discharge: Stable  Labs and Tests to be Followed up as an outpatient by PCP or Specialist:       Discharge Medications:        Medication List        START taking  these medications      aspirin 81 MG chewable tablet  Take 1 tablet by mouth daily  Replaces: aspirin 325 MG EC tablet     atorvastatin 40 MG tablet  Commonly known as: LIPITOR  Take 1 tablet by mouth nightly            CONTINUE taking these medications      amLODIPine 10 MG tablet  Commonly known as: NORVASC     EXCEDRIN PO     gabapentin 100 MG capsule  Commonly known as: NEURONTIN  Take 1 capsule by mouth in the morning and at bedtime.     lisinopril 40 MG tablet  Commonly known as: PRINIVIL;ZESTRIL     metFORMIN 500 MG tablet  Commonly known as: GLUCOPHAGE     RANITIDINE 150 MAX STRENGTH PO            STOP taking these medications      aspirin 325 MG EC tablet  Replaced by: aspirin 81 MG chewable tablet            ASK your doctor about these medications      vitamin D 1.25 MG (50000 UT) Caps capsule  Commonly known as:  ERGOCALCIFEROL  Take 1 capsule by mouth once a week               Where to Get Your Medications        These medications were sent to [facility] [address] [place], [place] - [address] - P [phone] Carmon Ginsberg [phone]  [address], [place] [address]      Phone: [phone]   aspirin 81 MG chewable tablet  atorvastatin 40 MG tablet        Objective Findings at Discharge:   BP 112/78   Pulse 79   Temp 98.4 F (36.9 C) (Oral)   Resp 13   Ht 1.753 m (5\' 9" )   Wt 55.8 kg (123 lb)   SpO2 100%   BMI 18.16 kg/m       Physical Exam:     Physical Exam  Constitutional:       General: He is not in acute distress.     Appearance: Normal appearance.   HENT:      Head: Normocephalic and atraumatic.      Nose: Nose normal.      Mouth/Throat:      Mouth: Mucous membranes are moist.      Pharynx: Oropharynx is clear.   Eyes:      Extraocular Movements: Extraocular movements intact.      Conjunctiva/sclera: Conjunctivae normal.      Pupils: Pupils are equal, round, and reactive to light.   Cardiovascular:      Rate and Rhythm: Normal rate and regular rhythm.      Pulses: Normal pulses.       Heart sounds: Normal heart sounds.   Pulmonary:      Effort: Pulmonary effort is normal.   Abdominal:      General: Abdomen is flat. Bowel sounds are normal.      Palpations: Abdomen is soft.   Musculoskeletal:         General: Normal range of motion.      Cervical back: Normal range of motion and neck supple.   Skin:     General: Skin is warm and dry.   Neurological:      General: No focal deficit present.      Mental Status: He is alert and oriented to person, place, and time. Mental status is at baseline.   Psychiatric:         Mood and Affect: Mood normal.         Behavior: Behavior normal.         Thought Content: Thought content normal.                Labs and Imaging   No results found.    CBC: No results for input(s): "WBC", "HGB", "PLT" in the last 72 hours.  BMP:  No results for input(s): "NA", "K", "CL", "CO2", "BUN", "CREATININE", "GLUCOSE" in the last 72 hours.  Hepatic: No results for input(s): "AST", "ALT", "BILITOT", "ALKPHOS" in the last 72 hours.    Invalid input(s): "ALB"  Lipids:   Lab Results   Component Value Date/Time    CHOL 127 12/13/2023 05:01 AM    HDL 38 12/13/2023 05:01 AM    TRIG 86 12/13/2023 05:01 AM     Hemoglobin A1C:   Lab Results   Component Value Date/Time    LABA1C 7.4 12/13/2023 05:01 AM     TSH: No results found for: "TSH"  Troponin: No results found  for: "TROPONINT"  Lactic Acid: No results for input(s): "LACTA" in the last 72 hours.  BNP: No results for input(s): "PROBNP" in the last 72 hours.  UA:No results found for: "NITRU", "COLORU", "PHUR", "LABCAST", "WBCUA", "RBCUA", "MUCUS", "TRICHOMONAS", "YEAST", "BACTERIA", "CLARITYU", "SPECGRAV", "LEUKOCYTESUR", "UROBILINOGEN", "BILIRUBINUR", "BLOODU", "GLUCOSEU", "KETUA", "AMORPHOUS"  Urine Cultures: No results found for: "LABURIN"  Blood Cultures: No results found for: "BC"  No results found for: "BLOODCULT2"  Organism: No results found for: "ORG"    Comment: Please note this report has been produced using speech recognition  software and may contain errors related to that system including errors in grammar, punctuation, and spelling, as well as words and phrases that may be inappropriate. If there are any questions or concerns please feel free to contact the dictating provider for clarification.     Time Spent Discharging patient 35 minutes    Electronically signed by Brayton Mars, MD on 12/23/2023 at 6:30 PM

## 2023-12-13 NOTE — Progress Notes (Signed)
Occupational Therapy    Attempted OT/PT evaluations, RN hold reporting no therapy needs with planned d/c soon. Re-order if needs arise.     Damita Lack, OTR

## 2023-12-13 NOTE — Discharge Instructions (Signed)
Please follow-up with neurology for EMG of arm.

## 2023-12-13 NOTE — Consults (Signed)
Neurology Service Consult Note  [facility]   Patient Name: Joe Avery  DOB: [birth year]        Subjective:   Reason for consult: Right sided weakness, left facial droop      Patient seen and examined. Chart reviewed in detail. On entering room patient is lying in bed with head of bed elevated. No acute distress noted. Patient is alert and oriented x4, able to follow commands. Patient states that about 4 to 5 days ago he was noticing that he was having some swelling to the left side of his face as well as some sensation changes, did not seek medication attention for this at that time. Noted that about 2 days ago he was having difficulty with his right hand, he was unable to completely close or open his hand and had difficulty with holding a glass.  Patient states that he would try and hold a glass and his hand would "just drop".  Patient has never had anything like this before. Denies any recent changes to his medications, denies any recent injuries or exposure to chemicals.   In the ED it was noted that patient had a left facial droop and documented right UE and LE drift.  CT head and CTA head and neck were non-acute.   On exam today patient continues to complain of some left facial swelling and difficulty with right hand grasp.  No other new symptoms. Patient is able to follow all commands appropriately, no significant weakness is noted in any extremity. No swelling for facial droop noted. Patient does have weak finger abduction against resistance, however good grip strength. No there unilateral or focal deficits on examination.   Patient does follow in the [facility] DCND office for his lumbar radiculopathy and peripheral neuropathy.    Past Medical History:   Diagnosis Date    Hypertension     Type 2 diabetes mellitus without complication, without long-term current use of insulin (HCC) 08/11/2023    :   History reviewed. No pertinent surgical history.  Medications:  Scheduled Meds:   [Held by  provider] amLODIPine  10 mg Oral Daily    [Held by provider] gabapentin  100 mg Oral BID    [Held by provider] lisinopril  40 mg Oral Daily    sodium chloride flush  5-40 mL IntraVENous 2 times per day    enoxaparin  40 mg SubCUTAneous Daily    atorvastatin  80 mg Oral Nightly    aspirin  81 mg Oral Daily    Or    aspirin  300 mg Rectal Daily    insulin lispro  0-4 Units SubCUTAneous 4x Daily AC & HS     Continuous Infusions:   sodium chloride      dextrose       PRN Meds:.sodium chloride flush, sodium chloride, ondansetron **OR** ondansetron, polyethylene glycol, labetalol, glucose, dextrose bolus **OR** dextrose bolus, glucagon (rDNA), dextrose    No Known Allergies  Social History     Socioeconomic History    Marital status: Single     Spouse name: Not on file    Number of children: Not on file    Years of education: Not on file    Highest education level: Not on file   Occupational History    Not on file   Tobacco Use    Smoking status: Every Day     Types: Cigarettes    Smokeless tobacco: Never   Substance and Sexual Activity  Alcohol use: Not on file    Drug use: Not Currently    Sexual activity: Not on file   Other Topics Concern    Not on file   Social History Narrative    Not on file     Social Determinants of Health     Financial Resource Strain: Not on file   Food Insecurity: No Food Insecurity (12/12/2023)    Hunger Vital Sign     Worried About Running Out of Food in the Last Year: Never true     Ran Out of Food in the Last Year: Never true   Transportation Needs: No Transportation Needs (12/12/2023)    PRAPARE - Therapist, art (Medical): No     Lack of Transportation (Non-Medical): No   Physical Activity: Not on file   Stress: Not on file   Social Connections: Not on file   Intimate Partner Violence: Not on file   Housing Stability: Low Risk  (12/12/2023)    Housing Stability Vital Sign     Unable to Pay for Housing in the Last Year: No     Number of Times Moved in the Last Year:  0     Homeless in the Last Year: No      Family History   Problem Relation Age of Onset    Diabetes Father          ROS (10 systems)  In addition to that documented in the HPI above, the additional ROS was obtained:  Constitutional: Denies fevers or chills  Eyes: Denies vision changes  ENMT: Denies sore throat  CV: Denies chest pain  Resp: Denies SOB  GI: Denies vomiting or diarrhea  GU: Denies painful urination  MSK: Denies recent trauma  Skin: Denies new rashes  Neuro:Right hand weakness.   Endocrine: Denies unexpected weight loss  Heme: Denies bleeding disorders    Physical Exam:       @NEWVITALS @   Wt Readings from Last 3 Encounters:   12/12/23 55.8 kg (123 lb)   10/13/23 56.2 kg (123 lb 12.8 oz)   08/11/23 56.9 kg (125 lb 6.4 oz)     Temp Readings from Last 3 Encounters:   12/13/23 97.8 F (36.6 C) (Oral)     BP Readings from Last 3 Encounters:   12/13/23 102/78   10/13/23 124/82   08/11/23 138/78     Pulse Readings from Last 3 Encounters:   12/13/23 70   10/13/23 (!) 104   08/11/23 (!) 109        Gen: A&O x 4, NAD, cooperative  HEENT: NC/AT, EOMI, PERRL, mmm, no carotid bruits, neck supple, no meningeal signs  Heart: RRR, S1S2  Lungs: CTAB  Ext: no edema, no calf tenderness b/l  Psych: normal mood and affect  Skin: no rashes or lesions    NEUROLOGIC EXAM:    Mental Status: A&O to self, location, month and year, NAD, speech clear, language fluent, repetition and naming intact, follows commands appropriately    Cranial Nerve Exam:   CN II-XII:  PERRL, VFF, no nystagmus, no gaze paresis, sensation V1-V3 intact b/l, muscles of facial expression symmetric; hearing intact to conversational tone, palate elevates symmetrically, shoulder elevation symmetric and tongue protrudes midline with movement side to side.    Motor Exam:       Strength 5/5 UE's/LE's b/l, right grasp is delayed on initial exam, unable to hold right fingers in abduction to resistance.   Tone and bulk  normal   No pronator drift    Deep Tendon  Reflexes: 2/4 biceps, triceps, brachioradialis, patellar (right 3/4 patellar chronic), and achilles b/l; flexor plantar responses b/l    Sensation: Intact light touch  UE's/LE's b/l, slight decreased to temp and vibration in bilateral lower extremities in length dependant distribution.     Coordination/Cerebellum:       Tremors--none      Rapidly alternating movements: no dysdiadochokinesia b/l                Heel-to-Shin: no dysmetria b/l      Finger-to-Nose: no dysmetria b/l    Gait and stance:      Gait: deferred      LABS:     Recent Labs     12/12/23  1240 12/12/23  1245 12/13/23  0501   WBC 8.6  --  5.9   NA 137  --  140   K 4.4  --  4.1   CL 100  --  105   CO2 27  --  24   BUN 22*  --  23*   CREATININE 1.2  --  1.1   GLUCOSE 112* 121 150*   ALBUMIN 3.9  --   --    INR 1.0  --   --    ZOXWRUEAV@  Panel:3,Ammonia:3,UA:3@,@B12FOL @,Last TSH Results,@GETTSH @,     Last Liver Function Results:  @LABRCNTIP (ALT:3,AST:3,BILITOTAL:3,BILIDIR:3,ALKPHOS:3)@          IMAGING:    CT Head:  IMPRESSION:  1. No acute major vascular territorial infarct or intracranial hemorrhage.  2. Mild nonspecific white matter disease.  3. 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    CTA Head and Neck:  IMPRESSION:  1. No major vessel occlusion or dissection in the head or neck.  2. Less than 50% stenosis of the proximal right internal carotid artery.    MRI brain w/o contrast  IMPRESSION:  1.  No evidence of an acute infarct.  2.  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  3.  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    Above imaging personally reviewed.   ASSESSMENT/PLAN:   63 y.o. - male with past medical history of HTN, T2DM, CKD, lumbar radiculopathy, peripheral neuropathy, current everyday smoker presenting to [facility] with complaints of sudden onset of right  hand weakness noticed when he was unable to pick up a glass at home and left facial droop.   Right hand weakness likely in the setting of possible ulnar neuropathy.   --Significant weakness noted in right fingers with abduction. No other unilateral or focal deficits on examination; noted in the ED documented left facial droop and right UE/LE drifts (not present today on exam).   --Neuro imaging as above:      --CT head no acute findings.      --CTA head and neck no LVO.      --MRI brain without acute findings; noted several small remote lacunar infarctions; recommend continuing on secondary stroke prevention.   --Ok to continue home medications including gabapentin.   --Ok to continue secondary stroke prevention: Aspirin 81 mg daily and Lipitor 40 mg at bedtime.   --LDL: 72; HgbA1c: 7.4  --Discussed follow up with PCP for diabetic management.   --Recommend PT/OT.   --Recommend that patient follow back  up in neurology office for EMG of UE for right hand weakness.     No further neurological testing is warranted at this time.  We will sign off.  Call us if needed.  Thank you    Patient care discussed with attending neurologist Dr. Laural Benes.     Thank you for allowing [place] to participate in the care of your patient.  If there are any questions regarding evaluation please feel free to contact us.     Antonietta Jewel, APRN - CNP, 12/13/2023  >75 minutes total time spent regarding this patient's encounter.  This included obtaining history, performing a neurological medical exam, developing an assessment / plan, and documenting via EMR.

## 2023-12-13 NOTE — Plan of Care (Signed)
Problem: Chronic Conditions and Co-morbidities  Goal: Patient's chronic conditions and co-morbidity symptoms are monitored and maintained or improved  12/13/2023 1154 by Ebbie Latus, RN  Outcome: Adequate for Discharge  12/13/2023 0242 by Laurina Bustle, RN  Outcome: Progressing  Flowsheets (Taken 12/12/2023 2031)  Care Plan - Patient's Chronic Conditions and Co-Morbidity Symptoms are Monitored and Maintained or Improved:   Monitor and assess patient's chronic conditions and comorbid symptoms for stability, deterioration, or improvement   Collaborate with multidisciplinary team to address chronic and comorbid conditions and prevent exacerbation or deterioration   Update acute care plan with appropriate goals if chronic or comorbid symptoms are exacerbated and prevent overall improvement and discharge     Problem: Discharge Planning  Goal: Discharge to home or other facility with appropriate resources  12/13/2023 1154 by Ebbie Latus, RN  Outcome: Adequate for Discharge  12/13/2023 0242 by Laurina Bustle, RN  Outcome: Progressing  Flowsheets (Taken 12/12/2023 2031)  Discharge to home or other facility with appropriate resources:   Identify barriers to discharge with patient and caregiver   Identify discharge learning needs (meds, wound care, etc)   Refer to discharge planning if patient needs post-hospital services based on physician order or complex needs related to functional status, cognitive ability or social support system     Problem: Safety - Adult  Goal: Free from fall injury  12/13/2023 1154 by Ebbie Latus, RN  Outcome: Adequate for Discharge  12/13/2023 0242 by Laurina Bustle, RN  Outcome: Progressing  Flowsheets (Taken 12/12/2023 2031)  Free From Fall Injury: Instruct family/caregiver on patient safety     Problem: Neurosensory - Adult  Goal: Achieves stable or improved neurological status  12/13/2023 1154 by Ebbie Latus, RN  Outcome: Adequate for Discharge  12/13/2023 0242 by Laurina Bustle,  RN  Outcome: Progressing  Flowsheets (Taken 12/12/2023 2031)  Achieves stable or improved neurological status: Assess for and report changes in neurological status     Problem: Pain  Goal: Verbalizes/displays adequate comfort level or baseline comfort level  Outcome: Adequate for Discharge

## 2023-12-15 LAB — EKG 12-LEAD
Atrial Rate: 101 {beats}/min
P Axis: 79 degrees
P-R Interval: 164 ms
Q-T Interval: 342 ms
QRS Duration: 94 ms
QTc Calculation (Bazett): 443 ms
R Axis: 113 degrees
T Axis: 79 degrees
Ventricular Rate: 101 {beats}/min

## 2023-12-29 ENCOUNTER — Encounter: Payer: MEDICAID | Attending: Registered Nurse | Primary: Internal Medicine

## 2024-01-05 LAB — URINALYSIS
Bilirubin, Urine: NEGATIVE
Glucose, Ur: NEGATIVE
Ketones, Urine: NEGATIVE
Leukocyte Esterase, Urine: NEGATIVE
Nitrite, Urine: NEGATIVE
Protein, UA: NEGATIVE
Specific Gravity, UA: 1.011 (ref 1.005–1.030)
Urobilinogen, Urine: 0.2 mg/dL (ref 0.2–1.0)
pH, Urine: 7.5 (ref 5.0–7.5)

## 2024-01-05 LAB — BASIC METABOLIC PANEL
BUN/Creatinine Ratio: 14 (ref 10–24)
BUN: 17 mg/dL (ref 8–27)
CO2: 25 mmol/L (ref 20–29)
Calcium: 10 mg/dL (ref 8.6–10.2)
Chloride: 102 mmol/L (ref 96–106)
Creatinine: 1.23 mg/dL (ref 0.76–1.27)
Estimated Glomerular Filtration Rate Creatinine Equation: 67 mL/min/{1.73_m2} (ref >59)
Glucose: 138 mg/dL — ABNORMAL HIGH (ref 70–99)
Potassium: 4.3 mmol/L (ref 3.5–5.2)
Sodium: 141 mmol/L (ref 134–144)

## 2024-01-05 LAB — URINALYSIS, MICRO
Bacteria, UA: NONE SEEN
Casts: NONE SEEN /[LPF]
Epithelial Cells, UA: NONE SEEN /[HPF] (ref 0–10)
LEUKOCYTES, UA: NONE SEEN /[HPF] (ref 0–5)

## 2024-01-05 LAB — CREATININE URINE, + PROTEIN URINE
Creatinine, Ur: 49.3 mg/dL
PROTEIN/CREAT RATIO URINE RAN: 122 mg/g{creat} (ref 0–200)
Protein, Urine, Random: 6 mg/dL

## 2024-01-05 LAB — MAGNESIUM: Magnesium: 1.7 mg/dL (ref 1.6–2.3)

## 2024-01-05 LAB — HEMOGLOBIN A1C: Hemoglobin A1C: 7.3 % — ABNORMAL HIGH (ref 4.8–5.6)

## 2024-01-05 LAB — PTH, INTACT: Pth Intact: 27 pg/mL (ref 15–65)

## 2024-01-05 LAB — ALBUMIN: Albumin: 4.6 g/dL (ref 3.8–4.9)

## 2024-01-14 ENCOUNTER — Ambulatory Visit: Admit: 2024-01-14 | Discharge: 2024-01-14 | Payer: MEDICAID | Attending: Nephrology | Primary: Internal Medicine

## 2024-01-14 VITALS — BP 110/62 | HR 99 | Ht 69.0 in | Wt 123.8 lb

## 2024-01-14 DIAGNOSIS — N1831 Chronic kidney disease, stage 3a (HCC): Secondary | ICD-10-CM

## 2024-01-14 NOTE — Progress Notes (Signed)
 Patient:   Joe Avery    Date:  01/14/24  DOB:  [birth year],   Nephrologist: Karilyn Cota, MD  Provider: Rande Brunt, MD    Chief Complaint/Reason for Consult:   follow-up renal      HISTORY OF PRESENT ILLNESS:   The patient is a 63 y.o. male who presents with  concern for stage III kidney disease creatinine is to be around 1.2 is increased to 1.4 wanted reevaluation by nephrology.  He has had diabetes diagnosed less than 1 year A1c closer to 7-8 range slightly high blood pressure but also anxious today as he is here for his visit as noted in the past have slightly iron deficiency and low vitamin D levels.  Although he is off the hydrochlorothiazide he still on the lisinopril minimal proteinuria with that medication potassium has been stable he is fairly skinny tries to take good care of himself and with above changes was asked to be seen by nephrology.     here today for follow-up still has some issues of sleeping at night with insomnia blood pressure is fairly well-controlled has some level of fatigue but no other complaints renal functions actually slightly improved back to baseline and denies any new other acute issues eats and drinks but does not have much appetite in general weight has been holding stable      Past Medical History:        Diagnosis Date    Hypertension     Type 2 diabetes mellitus without complication, without long-term current use of insulin (HCC) 08/11/2023       Past Surgical History:    History reviewed. No pertinent surgical history.    Medications:   Current Outpatient Medications   Medication Sig Dispense Refill    aspirin 81 MG chewable tablet Take 1 tablet by mouth daily 30 tablet 3    atorvastatin (LIPITOR) 40 MG tablet Take 1 tablet by mouth nightly 30 tablet 3    metFORMIN (GLUCOPHAGE) 500 MG tablet Take 1 tablet by mouth 2 times daily (with meals)      raNITIdine HCl (RANITIDINE 150 MAX STRENGTH PO) Take by mouth as needed      Aspirin-Acetaminophen-Caffeine (EXCEDRIN PO)  Take by mouth as needed      gabapentin (NEURONTIN) 100 MG capsule Take 1 capsule by mouth in the morning and at bedtime. 60 capsule 11    amLODIPine (NORVASC) 10 MG tablet Take 1 tablet by mouth daily      lisinopril (PRINIVIL;ZESTRIL) 40 MG tablet Take 1 tablet by mouth daily      vitamin D (ERGOCALCIFEROL) 1.25 MG (50000 UT) CAPS capsule Take 1 capsule by mouth once a week (Patient not taking: Reported on 12/12/2023) 4 capsule 3     No current facility-administered medications for this visit.        Allergies:  Patient has no known allergies.    Social History:   TOBACCO:   reports that he has been smoking cigarettes. He has never used smokeless tobacco.  ETOH:   has no history on file for alcohol use.  OCCUPATION:      Family History:       Problem Relation Age of Onset    Diabetes Father        REVIEW OF SYSTEMS:  Negative except for   No fevers chills chest pain shortness of breath positive diabetes.      Vitals: BP 110/62   Pulse 99   Ht 1.753 m (5\' 9" )  Wt 56.2 kg (123 lb 12.8 oz)   SpO2 99%   BMI 18.28 kg/m   General appearance: awake weak  HEENT: Head: Normal, normocephalic, atraumatic.  Neck: supple, symmetrical, trachea midline  Lungs: diminished breath sounds bilaterally  Heart: S1, S2 normal  Abdomen: abnormal findings:  soft NT  Extremities: edema none  Neurologic: Mental status: alertness: alert    LABS:  LABS:  CBC:   Lab Results   Component Value Date/Time    WBC 5.9 12/13/2023 05:01 AM    WBC 8.6 12/12/2023 12:40 PM    HGB 12.7 12/13/2023 05:01 AM    HGB 12.6 12/12/2023 12:40 PM    HGB 13.9 10/06/2023 12:00 AM    PLT 225 12/13/2023 05:01 AM    PLT 222 12/12/2023 12:40 PM     Renal Panel:   Lab Results   Component Value Date/Time    NA 141 01/04/2024 10:05 AM    NA 140 12/13/2023 05:01 AM    NA 137 12/12/2023 12:40 PM    K 4.3 01/04/2024 10:05 AM    K 4.1 12/13/2023 05:01 AM    K 4.4 12/12/2023 12:40 PM    CL 102 01/04/2024 10:05 AM    CL 105 12/13/2023 05:01 AM    CL 100 12/12/2023 12:40 PM     CO2 25 01/04/2024 10:05 AM    CO2 24 12/13/2023 05:01 AM    CO2 27 12/12/2023 12:40 PM    BUN 17 01/04/2024 10:05 AM    BUN 23 12/13/2023 05:01 AM    BUN 22 12/12/2023 12:40 PM    CREATININE 1.23 01/04/2024 10:05 AM    CREATININE 1.1 12/13/2023 05:01 AM    CREATININE 1.2 12/12/2023 12:40 PM    LABGLOM 66 12/13/2023 05:01 AM    LABGLOM 63 12/12/2023 12:40 PM    LABALBU SPECIMEN QUANTITY NOT SUFFICIENT 06/11/2023 02:51 PM       Lipids:   Lab Results   Component Value Date/Time    CHOL 127 12/13/2023 05:01 AM    HDL 38 12/13/2023 05:01 AM     INR:   Lab Results   Component Value Date/Time    INR 1.0 12/12/2023 12:40 PM     Calcium:    Lab Results   Component Value Date/Time    CALCIUM 10.0 01/04/2024 10:05 AM     Phosphorus:    Lab Results   Component Value Date/Time    PHOS 2.8 10/06/2023 12:00 AM       U/A:    Lab Results   Component Value Date/Time    PROTEINU Negative 01/04/2024 10:05 AM    NITRU Negative 01/04/2024 10:05 AM    COLORU Yellow 01/04/2024 10:05 AM    PHUR 7.5 01/04/2024 10:05 AM    LABCAST None seen 01/04/2024 10:05 AM    RBCUA 3-10 01/04/2024 10:05 AM    BACTERIA None seen 01/04/2024 10:05 AM    CLARITYU Clear 01/04/2024 10:05 AM    UROBILINOGEN 0.2 01/04/2024 10:05 AM    BILIRUBINUR Negative 01/04/2024 10:05 AM    BLOODU Trace 01/04/2024 10:05 AM    GLUCOSEU Negative 01/04/2024 10:05 AM    KETUA Negative 01/04/2024 10:05 AM       HgBA1c:    Lab Results   Component Value Date/Time    LABA1C 7.3 01/04/2024 10:05 AM     TSH:  No results found for: "TSH"  IRON:    Lab Results   Component Value Date/Time    IRON 53 06/11/2023 02:51  PM     Iron Saturation:  No components found for: "PERCENTFE"  TIBC:    Lab Results   Component Value Date/Time    TIBC 249 06/11/2023 02:51 PM     FERRITIN:  No results found for: "FERRITIN"  RPR:  No results found for: "RPR"  ANA:  No results found for: "ANATITER", "ANA"     lab work on July 03, 2023 creatinine 1.46 GFR 55 sodium 139 potassium 4.7 A1c 7.3 microalbumin  to creatinine ratio 6    10/09/23  Hb 13.9 ua no protein no blood urine prot/creat 91 mg spep and upep negative c3 c4 normal alb 4.7 ana negative tsh 1.2    IMAGING:    .Marland Kitchen     IMPRESSION:    1. Stage 3a chronic kidney disease (HCC)    2. Primary hypertension    3. Type 2 diabetes mellitus without complication, without long-term current use of insulin (HCC)    4. Persistent proteinuria         PLAN:   #1 stable stage III kidney disease creatinine 1.2 GFR 67 supportive care no sniffing a protein the urine does have a little hematuria will monitor and trend for now  2 blood pressure stable overall and monitor  3 try improve glucose   4 no  significant proteinuria but does have a little hematuria very mild has had history of kidney stones before will start on drinking more fluids and reevaluate in a few months      INSTRUCTIONS:   #1 low-salt diet   #2 stay hydrated more fluids   #3 watch for uti   #4 avoid Aleve for pain   #5 increase fluid intake !!!!   #6 control sugar   #97fu 3 months      Thank you for letting me help in the care of your patient and please feel free to call me if any concerns or questions.    Electronically signed by Karilyn Cota, MD on 01/14/2024 at 12:11 PM

## 2024-01-14 NOTE — Patient Instructions (Addendum)
 INSTRUCTIONS:   #1 low-salt diet   #2 stay hydrated more fluids   #3 watch for uti   #4 avoid Aleve for pain   #5 increase fluid intake !!!!   #6 control sugar   #7fu 3 months  #8 if issue insomnia can use melatonin or benadryl tablets

## 2024-01-19 ENCOUNTER — Encounter

## 2024-01-19 ENCOUNTER — Ambulatory Visit: Admit: 2024-01-19 | Discharge: 2024-01-19 | Payer: MEDICAID | Attending: Registered Nurse | Primary: Internal Medicine

## 2024-01-19 VITALS — BP 132/86 | HR 114 | Resp 16 | Ht 69.02 in | Wt 123.8 lb

## 2024-01-19 DIAGNOSIS — Z1211 Encounter for screening for malignant neoplasm of colon: Secondary | ICD-10-CM

## 2024-01-19 NOTE — Progress Notes (Signed)
 Joe Avery 63 y.o. male was seen by Burr Medico, NP-C on 01/19/24     Wt Readings from Last 3 Encounters:   01/19/24 56.2 kg (123 lb 12.8 oz)   01/14/24 56.2 kg (123 lb 12.8 oz)   12/12/23 55.8 kg (123 lb)       HPI  Joe Avery is a pleasant 63 y.o. Caucasian male who presents today to schedule a screening colonoscopy.  He has a past medical history of chronic kidney disease, hypertension, insomnia and type 2 diabetes mellitus without complication, without long-term current use of insulin.  He smokes three cigarettes per day.  He has never had a colonoscopy.  He denies changes in his bowel pattern.  His typical bowel pattern is daily to twice daily with soft brown formed stools.  No diarrhea or constipation.  No blood in his stools or melena.  No excess belching or flatulence.  His appetite is good without early satiety.  His weight is stable.  No nausea or vomiting. No abdominal pain, bloating or distention.  No heartburn or acid reflux.  No nocturnal awakenings with acid reflux.  No dysphagia or pain with swallowing.   No family history of stomach or colon cancer.    ROS  Review of Systems   Constitutional:  Negative for chills, diaphoresis, fatigue and fever.   HENT:  Negative for ear pain, hearing loss and tinnitus.    Eyes:  Negative for pain and discharge.   Respiratory:  Negative for cough, shortness of breath and wheezing.    Cardiovascular:  Negative for chest pain, palpitations and leg swelling.   Gastrointestinal:  Negative for abdominal pain, blood in stool, constipation, diarrhea, nausea and vomiting.   Endocrine: Negative for cold intolerance and heat intolerance.   Genitourinary:  Negative for dysuria, frequency, hematuria and urgency.   Musculoskeletal:  Negative for back pain, myalgias and neck pain.   Skin:  Negative for color change, pallor and rash.   Allergic/Immunologic: Negative for environmental allergies.   Neurological:  Positive for dizziness (intermittent). Negative for  seizures, weakness and headaches.   Hematological:  Does not bruise/bleed easily.   Psychiatric/Behavioral:  Positive for sleep disturbance. Negative for dysphoric mood. The patient is not nervous/anxious.        Allergies  No Known Allergies    Medications  Current Outpatient Medications   Medication Sig Dispense Refill    aspirin 81 MG chewable tablet Take 1 tablet by mouth daily 30 tablet 3    atorvastatin (LIPITOR) 40 MG tablet Take 1 tablet by mouth nightly 30 tablet 3    metFORMIN (GLUCOPHAGE) 500 MG tablet Take 1 tablet by mouth 2 times daily (with meals)      raNITIdine HCl (RANITIDINE 150 MAX STRENGTH PO) Take by mouth as needed      Aspirin-Acetaminophen-Caffeine (EXCEDRIN PO) Take by mouth as needed      gabapentin (NEURONTIN) 100 MG capsule Take 1 capsule by mouth in the morning and at bedtime. 60 capsule 11    amLODIPine (NORVASC) 10 MG tablet Take 1 tablet by mouth daily      lisinopril (PRINIVIL;ZESTRIL) 40 MG tablet Take 1 tablet by mouth daily      vitamin D (ERGOCALCIFEROL) 1.25 MG (50000 UT) CAPS capsule Take 1 capsule by mouth once a week (Patient not taking: Reported on 01/19/2024) 4 capsule 3     No current facility-administered medications for this visit.       Past medical history:   He has a  past medical history of Hypertension and Type 2 diabetes mellitus without complication, without long-term current use of insulin (HCC).    Past surgical history:  He has no past surgical history on file.    Social History:  He reports that he has been smoking cigarettes. He has never used smokeless tobacco. He reports that he does not currently use drugs.    Family history:  His family history includes Diabetes in his father.    Objective    Vitals:    01/19/24 1107   BP: 132/86   Pulse: (!) 114   Resp: 16   SpO2: 99%        Physical exam    Physical Exam  Constitutional:       General: He is not in acute distress.     Appearance: Normal appearance. He is well-developed. He is not ill-appearing,  toxic-appearing or diaphoretic.   HENT:      Head: Normocephalic and atraumatic.      Nose: Nose normal.      Mouth/Throat:      Mouth: Mucous membranes are moist.   Cardiovascular:      Rate and Rhythm: Normal rate and regular rhythm.      Pulses: Normal pulses.      Heart sounds: Normal heart sounds. No murmur heard.     No gallop.   Pulmonary:      Effort: Pulmonary effort is normal. No respiratory distress.      Breath sounds: Normal breath sounds. No stridor. No wheezing or rhonchi.   Abdominal:      General: Bowel sounds are normal. There is no distension.      Palpations: Abdomen is soft. There is no mass.      Tenderness: There is no abdominal tenderness.      Hernia: No hernia is present.   Musculoskeletal:         General: Normal range of motion.      Cervical back: Neck supple.   Skin:     General: Skin is warm and dry.   Neurological:      Mental Status: He is alert and oriented to person, place, and time.   Psychiatric:         Mood and Affect: Mood normal.         Orders Only on 01/04/2024   Component Date Value Ref Range Status    Specific Gravity, UA 01/04/2024 1.011  1.005 - 1.030 Final    pH, Urine 01/04/2024 7.5  5.0 - 7.5 Final    Color, UA 01/04/2024 Yellow  Yellow Final    Clarity, UA 01/04/2024 Clear  Clear Final    Leukocyte Esterase, Urine 01/04/2024 Negative  Negative Final    Protein, UA 01/04/2024 Negative  Negative/Trace Final    Glucose, Ur 01/04/2024 Negative  Negative Final    Ketones, Urine 01/04/2024 Negative  Negative Final    Blood, Urine 01/04/2024 Trace (A)  Negative Final    Bilirubin, Urine 01/04/2024 Negative  Negative Final    Urobilinogen, Urine 01/04/2024 0.2  0.2 - 1.0 mg/dL Final    Nitrite, Urine 01/04/2024 Negative  Negative Final    Microscopic Examination 01/04/2024 See below:   Final    Microscopic was indicated and was performed.    LEUKOCYTES, UA 01/04/2024 None seen  0 - 5 /hpf Final    RBC, UA 01/04/2024 3-10 (A)  0 - 2 /hpf Final    Epithelial Cells, UA  01/04/2024 None seen  0 -  10 /hpf Final    Casts 01/04/2024 None seen  None seen /lpf Final    Bacteria, UA 01/04/2024 None seen  None seen/Few Final    Glucose 01/04/2024 138 (H)  70 - 99 mg/dL Final    BUN 40/98/1191 17  8 - 27 mg/dL Final    Creatinine 47/82/9562 1.23  0.76 - 1.27 mg/dL Final    Estimated Glomerular Filtration Ra* 01/04/2024 67  >59 mL/min/1.73 Final    BUN/Creatinine Ratio 01/04/2024 14  10 - 24 Final    Sodium 01/04/2024 141  134 - 144 mmol/L Final    Potassium 01/04/2024 4.3  3.5 - 5.2 mmol/L Final    Chloride 01/04/2024 102  96 - 106 mmol/L Final    CO2 01/04/2024 25  20 - 29 mmol/L Final    Calcium 01/04/2024 10.0  8.6 - 10.2 mg/dL Final    Creatinine, Ur 01/04/2024 49.3  Not Estab. mg/dL Final    Protein, Urine, Random 01/04/2024 6.0  Not Estab. mg/dL Final    PROTEIN/CREAT RATIO URINE RAN 01/04/2024 122  0 - 200 mg/g creat Final    Hemoglobin A1C 01/04/2024 7.3 (H)  4.8 - 5.6 % Final    Comment:          Prediabetes: 5.7 - 6.4           Diabetes: >6.4           Glycemic control for adults with diabetes: <7.0      Albumin 01/04/2024 4.6  3.8 - 4.9 g/dL Final    Magnesium 13/06/6577 1.7  1.6 - 2.3 mg/dL Final    Pth Intact 46/96/2952 27  15 - 65 pg/mL Final   Admission on 12/12/2023, Discharged on 12/13/2023   Component Date Value Ref Range Status    Ventricular Rate 12/12/2023 101  BPM Final    Atrial Rate 12/12/2023 101  BPM Final    P-R Interval 12/12/2023 164  ms Final    QRS Duration 12/12/2023 94  ms Final    Q-T Interval 12/12/2023 342  ms Final    QTc Calculation (Bazett) 12/12/2023 443  ms Final    P Axis 12/12/2023 79  degrees Final    R Axis 12/12/2023 113  degrees Final    T Axis 12/12/2023 79  degrees Final    Diagnosis 12/12/2023    Final                    Value:Sinus tachycardia  Possible Left atrial enlargement  Right axis deviation  Pulmonary disease pattern  Incomplete right bundle branch block  No previous ECGs available  Confirmed by Ashraf MD, Juliette Alcide ([id number]) on  12/15/2023 6:58:55 AM      Glucose 12/12/2023 121  mg/dL Final    QC OK? 44/11/270 YES   Final    WBC 12/12/2023 8.6  4.0 - 10.5 k/uL Final    RBC 12/12/2023 4.24 (L)  4.60 - 6.20 m/uL Final    Hemoglobin 12/12/2023 12.6 (L)  13.5 - 18.0 g/dL Final    Hematocrit 53/66/4403 38.3 (L)  42.0 - 52.0 % Final    MCV 12/12/2023 90.3  78.0 - 100.0 fL Final    MCH 12/12/2023 29.7  27.0 - 31.0 pg Final    MCHC 12/12/2023 32.9  32.0 - 36.0 g/dL Final    RDW 47/42/5956 12.9  11.7 - 14.9 % Final    Platelets 12/12/2023 222  140 - 440 k/uL Final    MPV 12/12/2023  11.5 (H)  7.5 - 11.1 fL Final    Neutrophils % 12/12/2023 69 (H)  36 - 66 % Final    Lymphocytes % 12/12/2023 17 (L)  24 - 44 % Final    Monocytes % 12/12/2023 11 (H)  0 - 4 % Final    Eosinophils % 12/12/2023 1  0.0 - 3.0 % Final    Basophils % 12/12/2023 1  0 - 1 % Final    Immature Granulocytes % 12/12/2023 0  0 % Final    Neutrophils Absolute 12/12/2023 5.95  k/uL Final    Lymphocytes Absolute 12/12/2023 1.47  k/uL Final    Monocytes Absolute 12/12/2023 0.94  k/uL Final    Eosinophils Absolute 12/12/2023 0.11  k/uL Final    Basophils Absolute 12/12/2023 0.10  k/uL Final    Immature Granulocytes Absolute 12/12/2023 0.02  k/uL Final    Sodium 12/12/2023 137  136 - 145 mmol/L Final    Potassium 12/12/2023 4.4  3.5 - 5.1 mmol/L Final    Chloride 12/12/2023 100  99 - 110 mmol/L Final    CO2 12/12/2023 27  21 - 32 mmol/L Final    Anion Gap 12/12/2023 11  9 - 17 mmol/L Final    Glucose 12/12/2023 112 (H)  74 - 99 mg/dL Final    BUN 60/63/0160 22 (H)  7 - 20 mg/dL Final    Creatinine 10/93/2355 1.2  0.8 - 1.3 mg/dL Final    Est, Glom Filt Rate 12/12/2023 63  >60 mL/min/1.98m2 Final    Calcium 12/12/2023 9.6  8.3 - 10.6 mg/dL Final    Total Protein 12/12/2023 5.9 (L)  6.4 - 8.2 g/dL Final    Albumin 73/22/0254 3.9  3.4 - 5.0 g/dL Final    Albumin/Globulin Ratio 12/12/2023 2.0  1.1 - 2.2 Final    Total Bilirubin 12/12/2023 0.3  0.0 - 1.0 mg/dL Final    Alkaline Phosphatase  12/12/2023 76  40 - 129 U/L Final    ALT 12/12/2023 12  10 - 40 U/L Final    AST 12/12/2023 11 (L)  15 - 37 U/L Final    Troponin, High Sensitivity 12/12/2023 13  0 - 22 ng/L Final    Protime 12/12/2023 13.2  11.7 - 14.5 sec Final    INR 12/12/2023 1.0   Final    Comment:       Therapeutic Range:   Moderate Anticoagulant Intensity: INR = 2.0-3.0   High Anticoagulant Intensity: INR = 2.5-3.5      POC Glucose 12/12/2023 121 (H)  74 - 99 mg/dL Final    POC Glucose 27/04/2375 108 (H)  74 - 99 mg/dL Final    POC Glucose 28/31/5176 150 (H)  74 - 99 mg/dL Final    Sodium 16/05/3709 140  136 - 145 mmol/L Final    Potassium 12/13/2023 4.1  3.5 - 5.1 mmol/L Final    Chloride 12/13/2023 105  99 - 110 mmol/L Final    CO2 12/13/2023 24  21 - 32 mmol/L Final    Anion Gap 12/13/2023 11  9 - 17 mmol/L Final    Glucose 12/13/2023 150 (H)  74 - 99 mg/dL Final    BUN 62/69/4854 23 (H)  7 - 20 mg/dL Final    Creatinine 62/70/3500 1.1  0.8 - 1.3 mg/dL Final    Est, Glom Filt Rate 12/13/2023 66  >60 mL/min/1.21m2 Final    Calcium 12/13/2023 9.5  8.3 - 10.6 mg/dL Final    WBC 93/81/8299 5.9  4.0 - 10.5 k/uL Final    RBC 12/13/2023 4.31 (L)  4.60 - 6.20 m/uL Final    Hemoglobin 12/13/2023 12.7 (L)  13.5 - 18.0 g/dL Final    Hematocrit 16/08/9603 39.6 (L)  42.0 - 52.0 % Final    MCV 12/13/2023 91.9  78.0 - 100.0 fL Final    MCH 12/13/2023 29.5  27.0 - 31.0 pg Final    MCHC 12/13/2023 32.1  32.0 - 36.0 g/dL Final    RDW 54/07/8118 13.0  11.7 - 14.9 % Final    Platelets 12/13/2023 225  140 - 440 k/uL Final    MPV 12/13/2023 11.2 (H)  7.5 - 11.1 fL Final    Hemoglobin A1C 12/13/2023 7.4 (H)  4.2 - 6.3 % Final    Estimated Avg Glucose 12/13/2023 166  mg/dL Final    Cholesterol, Total 12/13/2023 127  125 - 199 mg/dL Final    HDL 14/78/2956 38 (L)  >40 mg/dL Final    LDL Cholesterol 12/13/2023 72  <101 mg/dL Final    Triglycerides 12/13/2023 86  <150 mg/dL Final       Assessment and Plan:  1.  Will plan for a colonoscopy with MAC anesthesia.  The  patient was informed of the risks and benefits of the procedure.  2.  Further recommendations for follow-up will be determined after the colonoscopy has been completed.

## 2024-02-11 NOTE — Progress Notes (Signed)
 Left message for patient to call back for PAT.

## 2024-02-12 NOTE — Progress Notes (Signed)
 Patient will be called with an arrival time on 02/22/2024 for his procedure at [facility] on 02/23/2024.    NOTHING TO EAT OR DRINK AFTER MIDNIGHT DAY OF SURGERY    1. Enter thru the hospital main entrance on day of surgery, check in at the Information Desk. If you arrive prior to 6:00am, enter thru the ER entrance.    2. Follow the directions as prescribed by the doctor for your procedure and medications.         Morning of surgery take: amlodipine and gabapentin.         Stop vitamins, supplements and NSAIDS:  patient will hold metformin day of her procedure.    3. Check with your Doctor regarding stopping blood thinners and follow their instructions.    4. Do not smoke, vape or use chewing tobacco morning of surgery. Do not drink any alcoholic beverages 24 hours prior to surgery.       This includes NA Beer. No street drugs 7 days prior to surgery.    5. If you have dentures, contacts of glasses they will be removed before going to the OR; please bring a case.    6. Please bring picture ID, insurance card, paperwork from the doctor's office (H & P, Consent, & card for implantable devices).    7. Take a shower with an antibacterial soap the night before surgery and the morning of surgery. Do not put anything on your skin      After your morning shower.    8. You will need a responsible adult to drive you home and check on you after surgery.

## 2024-02-17 ENCOUNTER — Ambulatory Visit
Admit: 2024-02-17 | Discharge: 2024-02-17 | Payer: MEDICARE | Attending: Student in an Organized Health Care Education/Training Program | Primary: Internal Medicine

## 2024-02-17 VITALS — BP 134/84 | HR 100 | Wt 122.4 lb

## 2024-02-17 DIAGNOSIS — G5621 Lesion of ulnar nerve, right upper limb: Secondary | ICD-10-CM

## 2024-02-17 NOTE — Progress Notes (Signed)
 Progress Note  [facility]  Patient Name: Joe Avery  DOB: [birth year]        Subjective:   Reason for consult:   Patient seen and examined. Chart reviewed in detail.    63 y.o. -male with PMH hypertension, "borderline diabetes," peripheral neuropathy.  He is seen with a new problem today, right hand weakness.    He tells me February of this year he woke with weakness in what he felt was right arm and hand for which he presented to the ER. He was found to have no arm weakness but weakness with abduction of the digits of his right hand , suspected at the time to be an ulnar neuropathy.  He underwent nonacute MRI head though did show subacute lacunar infarcts, and was discharged home on 81 mg aspirin and 40 mg atorvastatin.  Statin nave LDL was found to be 72.    He tells me since then his symptoms have "95% resolved"     ER physician's note from February hospitalization reviewed.  Neurology APRN note from February hospitalization reviewed.  Nephrologist note from 01/14/2024 reviewed.         No data to display                 Past Medical History:   Diagnosis Date    Hypertension     Type 2 diabetes mellitus without complication, without long-term current use of insulin 08/11/2023    :   No past surgical history on file.      No Known Allergies Patient denies others  Social History     Socioeconomic History    Marital status: Single     Spouse name: Not on file    Number of children: Not on file    Years of education: Not on file    Highest education level: Not on file   Occupational History    Not on file   Tobacco Use    Smoking status: Every Day     Types: Cigarettes    Smokeless tobacco: Never   Vaping Use    Vaping status: Never Used   Substance and Sexual Activity    Alcohol use: Never    Drug use: Not Currently    Sexual activity: Not on file   Other Topics Concern    Not on file   Social History Narrative    Not on file     Social Drivers of Health     Financial Resource Strain: Not on file   Food  Insecurity: No Food Insecurity (12/12/2023)    Hunger Vital Sign     Worried About Running Out of Food in the Last Year: Never true     Ran Out of Food in the Last Year: Never true   Transportation Needs: No Transportation Needs (12/12/2023)    PRAPARE - Therapist, art (Medical): No     Lack of Transportation (Non-Medical): No   Physical Activity: Not on file   Stress: Not on file   Social Connections: Not on file   Intimate Partner Violence: Not on file   Housing Stability: Low Risk  (12/12/2023)    Housing Stability Vital Sign     Unable to Pay for Housing in the Last Year: No     Number of Times Moved in the Last Year: 0     Homeless in the Last Year: No    Patient denies others  Family History  Problem Relation Age of Onset    Diabetes Father     Patient denies others.       ROS (10 systems)  10 point review of systems was completed and was negative except the above.     Physical Exam:       @NEWVITALS @   Wt Readings from Last 3 Encounters:   02/17/24 55.5 kg (122 lb 6.4 oz)   01/19/24 56.2 kg (123 lb 12.8 oz)   01/14/24 56.2 kg (123 lb 12.8 oz)     Temp Readings from Last 3 Encounters:   12/13/23 98.4 F (36.9 C) (Oral)     BP Readings from Last 3 Encounters:   02/17/24 134/84   01/19/24 132/86   01/14/24 110/62     Pulse Readings from Last 3 Encounters:   02/17/24 100   01/19/24 (!) 114   01/14/24 99      Gen: A&O x 4, NAD, cooperative  HEENT: NC/AT, EOMI, PERRL, mmm, neck supple, no meningeal signs;   ,   Heart: without central or acrocyanosis  Lungs: Without signs of respiratory distress  Ext: no edema, no calf tenderness b/l  Psych: normal mood and affect  Skin: no rashes or lesions    NEUROLOGIC EXAM:     Mental Status: A&O to self, location, month and year, NAD, speech clear, language fluent, repetition and naming intact, follows commands appropriately    Cranial Nerve Exam:   CN II-XII: PERRL, VFF, no nystagmus, no gaze paresis, sensation V1-V3 intact b/l, muscles of facial  expression symmetric; hearing intact to conversational tone, palate elevates symmetrically, shoulder elevation symmetric and tongue protrudes midline with movement side to side.    Motor Exam:       Strength 5/5 UE's/LE's b/l  Spasticity noted RLE  No pronator drift    Deep Tendon Reflexes: 2/4 brachioradialis, 2+ patellar BL, 2/4 L and 0/4 R  achilles b/l. + Hoffman BL.  Without ankle clonus BL.    Sensation: Intact light touch UE's/LE's b/l  BL 4/5 distribution decreased temp   BL LE decreased temp, vib, pin    Coordination/Cerebellum:       Tremors--none      Finger-to-Nose: no dysmetria b/l    Gait and stance:      Gait: Unsteady, walks with knees bent      LABS:        CBC: No results for input(s): "WBC", "RBC", "HGB", "HCT", "PLT", "MCV" in the last 72 hours.  BMP:  No results for input(s): "NA", "K", "CL", "CO2", "BUN", "CREATININE", "GLUCOSE", "CALCIUM" in the last 72 hours.    IMAGING:      MRI brain without contrast 12/13/2023: Personally reviewed and is non-acute.  IMPRESSION:     1.  No evidence of an acute infarct.  2.  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  3.  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    CTA head and neck 12/12/2023: Personally reviewed and shows mild bilateral ICA stenosis without significant stenosis otherwise.  No evidence of aneurysm nor dissection.  IMPRESSION:  1. No major vessel occlusion or dissection in the head or neck.  2. Less than 50% stenosis of the proximal right internal carotid artery.       ASSESSMENT/PLAN:       ICD-10-CM    1. Ulnar neuropathy at elbow of right upper extremity  G56.21  63 y.o. -male with PMH TIA, hypertension and borderline diabetes who initially presented with peripheral neuropathy and who returns today with a new complaint after recent ER visit for sudden onset weakness of his right hand.    His exam today is consistent with a right ulnar  neuropathy, most likely at the elbow.  He tells me the night before he woke up with the symptoms 2 months ago he had slept with his arm forcibly flexed at the elbow and bearing weight on it.  This has improved quite a bit since onset, and we discussed today that I expected to resolve we can avoid pressure along the course of the ulnar nerve which is reviewed with him today.    We did discuss the remote lacunar stroke seen on his recent MRI of the head.  We discussed that he is at risk of suffering another stroke in the future.  We discussed his risk factor modification including the importance of smoking cessation.  He is encouraged to continue his 81 mg aspirin and 40 mg Lipitor daily.      Neurodiagnostics as above      We will plan to see the patient back in 2 months with APP.  If he is still having some weakness in the right hand with abduction, we will recommend EMG of the bilateral upper extremities at that time.    (Please note that portions of this note were completed with a voice recognition program.  Efforts were made to edit the dictations but occasionally words are mistranscribed.)      Primus Bravo, DO, 02/17/2024

## 2024-02-22 NOTE — Anesthesia Pre-Procedure Evaluation (Signed)
 Department of Anesthesiology  Preprocedure Note       Name:  Joe Avery   Age:  63 y.o.  DOB:  Mar 24, 1961                                          MRN:  [id number]         Date:  02/22/2024      Surgeon: Moishe Spice):  Alm Bustard, MD    Procedure: Procedure(s):  COLONOSCOPY DIAGNOSTIC    Medications prior to admission:   Prior to Admission medications    Medication Sig Start Date End Date Taking? Authorizing Provider   aspirin 81 MG chewable tablet Take 1 tablet by mouth daily 12/14/23   D'Entremont, Ruben Im, MD   atorvastatin (LIPITOR) 40 MG tablet Take 1 tablet by mouth nightly 12/13/23   D'Entremont, Ruben Im, MD   metFORMIN (GLUCOPHAGE) 500 MG tablet Take 1 tablet by mouth 2 times daily (with meals) 08/12/23   [provider]   raNITIdine HCl (RANITIDINE 150 MAX STRENGTH PO) Take by mouth as needed  Patient not taking: Reported on 02/17/2024    [provider]   Aspirin-Acetaminophen-Caffeine (EXCEDRIN PO) Take by mouth as needed    [provider]   vitamin D (ERGOCALCIFEROL) 1.25 MG (50000 UT) CAPS capsule Take 1 capsule by mouth once a week  Patient not taking: Reported on 12/12/2023 08/11/23   Karilyn Cota, MD   gabapentin (NEURONTIN) 100 MG capsule Take 1 capsule by mouth in the morning and at bedtime. 08/06/23 08/05/24  Stephan Minister, APRN - CNP   amLODIPine (NORVASC) 10 MG tablet Take 1 tablet by mouth daily 02/02/23 02/02/24  [provider]   lisinopril (PRINIVIL;ZESTRIL) 40 MG tablet Take 1 tablet by mouth daily 02/02/23 02/02/24  [provider]       Current medications:    No current facility-administered medications for this encounter.     Current Outpatient Medications   Medication Sig Dispense Refill    aspirin 81 MG chewable tablet Take 1 tablet by mouth daily 30 tablet 3    atorvastatin (LIPITOR) 40 MG tablet Take 1 tablet by mouth nightly 30 tablet 3    metFORMIN (GLUCOPHAGE) 500 MG tablet Take 1 tablet by mouth 2 times daily (with meals)       raNITIdine HCl (RANITIDINE 150 MAX STRENGTH PO) Take by mouth as needed (Patient not taking: Reported on 02/17/2024)      Aspirin-Acetaminophen-Caffeine (EXCEDRIN PO) Take by mouth as needed      vitamin D (ERGOCALCIFEROL) 1.25 MG (50000 UT) CAPS capsule Take 1 capsule by mouth once a week (Patient not taking: Reported on 12/12/2023) 4 capsule 3    gabapentin (NEURONTIN) 100 MG capsule Take 1 capsule by mouth in the morning and at bedtime. 60 capsule 11    amLODIPine (NORVASC) 10 MG tablet Take 1 tablet by mouth daily      lisinopril (PRINIVIL;ZESTRIL) 40 MG tablet Take 1 tablet by mouth daily         Allergies:  No Known Allergies    Problem List:    Patient Active Problem List   Diagnosis Code    Chronic kidney disease, stage III (moderate) (HCC) N18.30    Primary hypertension I10    Low vitamin D level R79.89    Type 2 diabetes mellitus without complication, without long-term current use of  insulin E11.9    Persistent proteinuria R80.1    Stroke-like symptoms R29.90    Primary insomnia F51.01       Past Medical History:        Diagnosis Date    Hypertension     Type 2 diabetes mellitus without complication, without long-term current use of insulin 08/11/2023       Past Surgical History:  History reviewed. No pertinent surgical history.    Social History:    Social History     Tobacco Use    Smoking status: Every Day     Types: Cigarettes    Smokeless tobacco: Never   Substance Use Topics    Alcohol use: Never                                Ready to quit: Not Answered  Counseling given: Not Answered      Vital Signs (Current):   Vitals:    02/12/24 1318   Weight: 55.8 kg (123 lb)   Height: 1.753 m (5\' 9" )                                              BP Readings from Last 3 Encounters:   02/17/24 134/84   01/19/24 132/86   01/14/24 110/62       NPO Status:                                                                                 BMI:   Wt Readings from Last 3 Encounters:   02/17/24 55.5 kg (122 lb 6.4 oz)    01/19/24 56.2 kg (123 lb 12.8 oz)   01/14/24 56.2 kg (123 lb 12.8 oz)     Body mass index is 18.16 kg/m.    CBC:   Lab Results   Component Value Date/Time    WBC 5.9 12/13/2023 05:01 AM    RBC 4.31 12/13/2023 05:01 AM    HGB 12.7 12/13/2023 05:01 AM    HCT 39.6 12/13/2023 05:01 AM    MCV 91.9 12/13/2023 05:01 AM    RDW 13.0 12/13/2023 05:01 AM    PLT 225 12/13/2023 05:01 AM       CMP:   Lab Results   Component Value Date/Time    NA 141 01/04/2024 10:05 AM    K 4.3 01/04/2024 10:05 AM    CL 102 01/04/2024 10:05 AM    CO2 25 01/04/2024 10:05 AM    BUN 17 01/04/2024 10:05 AM    CREATININE 1.23 01/04/2024 10:05 AM    LABGLOM 66 12/13/2023 05:01 AM    GLUCOSE 138 01/04/2024 10:05 AM    CALCIUM 10.0 01/04/2024 10:05 AM    BILITOT 0.3 12/12/2023 12:40 PM    ALKPHOS 76 12/12/2023 12:40 PM    AST 11 12/12/2023 12:40 PM    ALT 12 12/12/2023 12:40 PM       POC Tests: No results for input(s): "POCGLU", "POCNA", "POCK", "POCCL", "POCBUN", "POCHEMO", "POCHCT" in the  last 72 hours.    Coags:   Lab Results   Component Value Date/Time    PROTIME 13.2 12/12/2023 12:40 PM    INR 1.0 12/12/2023 12:40 PM       HCG (If Applicable): No results found for: "PREGTESTUR", "PREGSERUM", "HCG", "HCGQUANT"     ABGs: No results found for: "PHART", "PO2ART", "PCO2ART", "HCO3ART", "BEART", "O2SATART"     Type & Screen (If Applicable):  No results found for: "ABORH", "LABANTI"    Drug/Infectious Status (If Applicable):  No results found for: "HIV", "HEPCAB"    COVID-19 Screening (If Applicable): No results found for: "COVID19"        Anesthesia Evaluation  Patient summary reviewed  Airway:           Dental:          Pulmonary:                              Cardiovascular:    (+) hypertension:      ECG reviewed               Beta Blocker:  Not on Beta Blocker      ROS comment: 2025 EKG    Sinus tachycardia  Possible Left atrial enlargement  Right axis deviation  Pulmonary disease pattern  Incomplete right bundle branch block  No previous ECGs  available         Neuro/Psych:                ROS comment: 12/2023 ER  Positive for facial asymmetry and weakness (right sided). Negative for tremors, syncope, speech difficulty, light-headedness, numbness and headaches.     2025 CT  1. No acute major vascular territorial infarct or intracranial hemorrhage.  2. Mild nonspecific white matter disease.  3. 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   GI/Hepatic/Renal:   (+) renal disease: CRI, bowel prep          Endo/Other:    (+) DiabetesType II DM.                 Abdominal:             Vascular:           ROS comment: 1. No major vessel occlusion or dissection in the head or neck.  2. Less than 50% stenosis of the proximal right internal carotid artery.  . Other Findings:             Anesthesia Plan      MAC     ASA 3     (Chart review)  Induction: intravenous.                            Lera Rank, APRN - CRNA   02/22/2024

## 2024-02-22 NOTE — Progress Notes (Addendum)
 Left message for patient to arrive at 1115 at [facility] on 4/15/225 for his procedure at 1245.

## 2024-02-23 ENCOUNTER — Inpatient Hospital Stay: Payer: MEDICARE | Attending: Internal Medicine

## 2024-02-23 LAB — POCT GLUCOSE: POC Glucose: 136 mg/dL — ABNORMAL HIGH (ref 74–99)

## 2024-02-23 MED ORDER — LIDOCAINE HCL (PF) 2 % IJ SOLN
2 | INTRAMUSCULAR | Status: AC
Start: 2024-02-23 — End: ?

## 2024-02-23 MED ORDER — LIDOCAINE HCL (PF) 2 % IJ SOLN
2 | Freq: Once | INTRAMUSCULAR | Status: DC | PRN
Start: 2024-02-23 — End: 2024-02-23
  Administered 2024-02-23: 17:00:00 100 via INTRAVENOUS

## 2024-02-23 MED ORDER — LACTATED RINGERS IV SOLN
INTRAVENOUS | Status: DC
Start: 2024-02-23 — End: 2024-02-23

## 2024-02-23 MED ORDER — SODIUM CHLORIDE 0.9 % IV SOLN
0.9 | INTRAVENOUS | Status: DC | PRN
Start: 2024-02-23 — End: 2024-02-23

## 2024-02-23 MED ORDER — NORMAL SALINE FLUSH 0.9 % IV SOLN
0.9 | INTRAVENOUS | Status: DC | PRN
Start: 2024-02-23 — End: 2024-02-23

## 2024-02-23 MED ORDER — LACTATED RINGERS IV SOLN
INTRAVENOUS | Status: DC | PRN
Start: 2024-02-23 — End: 2024-02-23
  Administered 2024-02-23: 17:00:00 via INTRAVENOUS

## 2024-02-23 MED ORDER — PROPOFOL 200 MG/20ML IV EMUL
200 | INTRAVENOUS | Status: AC
Start: 2024-02-23 — End: ?

## 2024-02-23 MED ORDER — NORMAL SALINE FLUSH 0.9 % IV SOLN
0.9 | Freq: Two times a day (BID) | INTRAVENOUS | Status: DC
Start: 2024-02-23 — End: 2024-02-23

## 2024-02-23 MED ORDER — PROPOFOL 200 MG/20ML IV EMUL
200 | Freq: Once | INTRAVENOUS | Status: DC | PRN
Start: 2024-02-23 — End: 2024-02-23
  Administered 2024-02-23: 17:00:00 150 via INTRAVENOUS
  Administered 2024-02-23: 17:00:00 50 via INTRAVENOUS

## 2024-02-23 MED FILL — DIPRIVAN 200 MG/20ML IV EMUL: 200 MG/20ML | INTRAVENOUS | Qty: 40

## 2024-02-23 MED FILL — BD POSIFLUSH 0.9 % IV SOLN: 0.9 % | INTRAVENOUS | Qty: 40

## 2024-02-23 MED FILL — XYLOCAINE-MPF 2 % IJ SOLN: 2 % | INTRAMUSCULAR | Qty: 5

## 2024-02-23 MED FILL — LACTATED RINGERS IV SOLN: INTRAVENOUS | Qty: 500

## 2024-02-23 NOTE — Anesthesia Pre-Procedure Evaluation (Signed)
 Department of Anesthesiology  Preprocedure Note       Name:  Joe Avery   Age:  63 y.o.  DOB:  [birth year]                                          MRN:  [id number]         Date:  02/23/2024      Surgeon: Moishe Spice):  Alm Bustard, MD    Procedure: Procedure(s):  COLONOSCOPY DIAGNOSTIC    Medications prior to admission:   Prior to Admission medications    Medication Sig Start Date End Date Taking? Authorizing Provider   aspirin 81 MG chewable tablet Take 1 tablet by mouth daily 12/14/23  Yes D'Entremont, Ruben Im, MD   atorvastatin (LIPITOR) 40 MG tablet Take 1 tablet by mouth nightly 12/13/23  Yes D'Entremont, Ruben Im, MD   metFORMIN (GLUCOPHAGE) 500 MG tablet Take 1 tablet by mouth 2 times daily (with meals) 08/12/23  Yes [provider]   gabapentin (NEURONTIN) 100 MG capsule Take 1 capsule by mouth in the morning and at bedtime. 08/06/23 08/05/24 Yes Stephan Minister, APRN - CNP   raNITIdine HCl (RANITIDINE 150 MAX STRENGTH PO) Take by mouth as needed  Patient not taking: Reported on 02/23/2024    [provider]   Aspirin-Acetaminophen-Caffeine (EXCEDRIN PO) Take by mouth as needed    [provider]   vitamin D (ERGOCALCIFEROL) 1.25 MG (50000 UT) CAPS capsule Take 1 capsule by mouth once a week  Patient not taking: Reported on 12/12/2023 08/11/23   Karilyn Cota, MD   amLODIPine (NORVASC) 10 MG tablet Take 1 tablet by mouth daily 02/02/23 02/02/24  [provider]   lisinopril (PRINIVIL;ZESTRIL) 40 MG tablet Take 1 tablet by mouth daily 02/02/23 02/02/24  [provider]       Current medications:    Current Facility-Administered Medications   Medication Dose Route Frequency Provider Last Rate Last Admin   . sodium chloride flush 0.9 % injection 5-40 mL  5-40 mL IntraVENous 2 times per day Grover Canavan, APRN - CNP       . sodium chloride flush 0.9 % injection 5-40 mL  5-40 mL IntraVENous PRN Grover Canavan, APRN - CNP       . 0.9 % sodium chloride  infusion   IntraVENous PRN Grover Canavan, APRN - CNP       . lactated ringers infusion   IntraVENous Continuous Grover Canavan, APRN - CNP           Allergies:  No Known Allergies    Problem List:    Patient Active Problem List   Diagnosis Code   . Chronic kidney disease, stage III (moderate) (HCC) N18.30   . Primary hypertension I10   . Low vitamin D level R79.89   . Type 2 diabetes mellitus without complication, without long-term current use of insulin E11.9   . Persistent proteinuria R80.1   . Stroke-like symptoms R29.90   . Primary insomnia F51.01       Past Medical History:        Diagnosis Date   . Hypertension    . Type 2 diabetes mellitus without complication, without long-term current use of insulin 08/11/2023       Past Surgical History:  History reviewed. No pertinent surgical history.    Social History:  Social History     Tobacco Use   . Smoking status: Every Day     Types: Cigarettes   . Smokeless tobacco: Never   Substance Use Topics   . Alcohol use: Never                                Ready to quit: Not Answered  Counseling given: Not Answered      Vital Signs (Current):   Vitals:    02/12/24 1318 02/23/24 1151   BP:  (!) 144/85   Pulse:  97   Resp:  16   Temp:  97.4 F (36.3 C)   TempSrc:  Oral   SpO2:  99%   Weight: 55.8 kg (123 lb)    Height: 1.753 m (5\' 9" )                                               BP Readings from Last 3 Encounters:   02/23/24 (!) 144/85   02/17/24 134/84   01/19/24 132/86       NPO Status: Time of last liquid consumption: 1000                        Time of last solid consumption: 1800                        Date of last liquid consumption: 02/23/24                        Date of last solid food consumption: 02/21/24    BMI:   Wt Readings from Last 3 Encounters:   02/12/24 55.8 kg (123 lb)   02/17/24 55.5 kg (122 lb 6.4 oz)   01/19/24 56.2 kg (123 lb 12.8 oz)     Body mass index is 18.16 kg/m.    CBC:   Lab Results   Component Value Date/Time    WBC 5.9  12/13/2023 05:01 AM    RBC 4.31 12/13/2023 05:01 AM    HGB 12.7 12/13/2023 05:01 AM    HCT 39.6 12/13/2023 05:01 AM    MCV 91.9 12/13/2023 05:01 AM    RDW 13.0 12/13/2023 05:01 AM    PLT 225 12/13/2023 05:01 AM       CMP:   Lab Results   Component Value Date/Time    NA 141 01/04/2024 10:05 AM    K 4.3 01/04/2024 10:05 AM    CL 102 01/04/2024 10:05 AM    CO2 25 01/04/2024 10:05 AM    BUN 17 01/04/2024 10:05 AM    CREATININE 1.23 01/04/2024 10:05 AM    LABGLOM 66 12/13/2023 05:01 AM    GLUCOSE 138 01/04/2024 10:05 AM    CALCIUM 10.0 01/04/2024 10:05 AM    BILITOT 0.3 12/12/2023 12:40 PM    ALKPHOS 76 12/12/2023 12:40 PM    AST 11 12/12/2023 12:40 PM    ALT 12 12/12/2023 12:40 PM       POC Tests: No results for input(s): "POCGLU", "POCNA", "POCK", "POCCL", "POCBUN", "POCHEMO", "POCHCT" in the last 72 hours.    Coags:   Lab Results   Component Value Date/Time    PROTIME 13.2 12/12/2023 12:40 PM    INR 1.0  12/12/2023 12:40 PM       HCG (If Applicable): No results found for: "PREGTESTUR", "PREGSERUM", "HCG", "HCGQUANT"     ABGs: No results found for: "PHART", "PO2ART", "PCO2ART", "HCO3ART", "BEART", "O2SATART"     Type & Screen (If Applicable):  No results found for: "ABORH", "LABANTI"    Drug/Infectious Status (If Applicable):  No results found for: "HIV", "HEPCAB"    COVID-19 Screening (If Applicable): No results found for: "COVID19"        Anesthesia Evaluation    Airway: Mallampati: II          Dental: normal exam   (+) poor dentition      Pulmonary:normal exam    (+)           current smoker          Patient did not smoke on day of surgery.                 Cardiovascular:    (+) hypertension: moderate                  Neuro/Psych:   Negative Neuro/Psych ROS              GI/Hepatic/Renal:   (+) renal disease: CRI          Endo/Other:    (+) DiabetesType II DM, poorly controlled.                 Abdominal:             Vascular: negative vascular ROS.         Other Findings:       Anesthesia Plan      MAC     ASA 3        Induction: intravenous.      Anesthetic plan and risks discussed with patient.      Plan discussed with CRNA.    Attending anesthesiologist reviewed and agrees with Preprocedure content            Dagoberto Drop, MD   02/23/2024

## 2024-02-23 NOTE — H&P (Signed)
 ENDOSCOPY   Pre-operative History and Physical    Patient: Joe Avery  DOB: June 29, 1961      History Obtained From:  patient, electronic medical record        HISTORY OF PRESENT ILLNESS:                The patient is a 63 y.o. male with significant past medical history as below who presents for colonoscopy    Indication Screening colonoscopy     Past Medical History:        Diagnosis Date    Hypertension     Type 2 diabetes mellitus without complication, without long-term current use of insulin 08/11/2023       Past Surgical History:    History reviewed. No pertinent surgical history.      Current Medications:    Medications    Prior to Admission medications    Medication Sig Start Date End Date Taking? Authorizing Provider   aspirin 81 MG chewable tablet Take 1 tablet by mouth daily 12/14/23  Yes D'Entremont, Terrye Fiedler, MD   atorvastatin (LIPITOR) 40 MG tablet Take 1 tablet by mouth nightly 12/13/23  Yes D'Entremont, Terrye Fiedler, MD   metFORMIN (GLUCOPHAGE) 500 MG tablet Take 1 tablet by mouth 2 times daily (with meals) 08/12/23  Yes [provider]   gabapentin (NEURONTIN) 100 MG capsule Take 1 capsule by mouth in the morning and at bedtime. 08/06/23 08/05/24 Yes Emmer Harlem, APRN - CNP   raNITIdine HCl (RANITIDINE 150 MAX STRENGTH PO) Take by mouth as needed  Patient not taking: Reported on 02/23/2024    [provider]   Aspirin-Acetaminophen-Caffeine (EXCEDRIN PO) Take by mouth as needed    [provider]   vitamin D (ERGOCALCIFEROL) 1.25 MG (50000 UT) CAPS capsule Take 1 capsule by mouth once a week  Patient not taking: Reported on 12/12/2023 08/11/23   Eleazar Grief, MD   amLODIPine (NORVASC) 10 MG tablet Take 1 tablet by mouth daily 02/02/23 02/02/24  [provider]   lisinopril (PRINIVIL;ZESTRIL) 40 MG tablet Take 1 tablet by mouth daily 02/02/23 02/02/24  [provider]      Scheduled Medications:    sodium chloride flush  5-40 mL IntraVENous 2 times per day      Infusions:    sodium chloride      lactated ringers       PRN Medications: sodium chloride flush, sodium chloride    Allergies: No Known Allergies      Allergies:  Patient has no known allergies.    Social History:   TOBACCO:   reports that he has been smoking cigarettes. He has never used smokeless tobacco.  ETOH:   reports no history of alcohol use.    Family History:       Problem Relation Age of Onset    Diabetes Father        REVIEW OF SYSTEMS:    Negative except for HPI        PHYSICAL EXAM:      Vitals:    BP (!) 144/85   Pulse 97   Temp 97.4 F (36.3 C) (Oral)   Resp 16   Ht 1.753 m (5\' 9" )   Wt 55.8 kg (123 lb)   SpO2 99%   BMI 18.16 kg/m     General Appearance:    Alert, cooperative, no distress, appears stated age   HEENT:    NO anemia, No jaundice , no Cyanosis, Supple  neck   Neck:   Supple, symmetrical, trachea midline, no adenopathy;     thyroid:    Lungs:     Clear to auscultation bilaterally, respirations unlabored   Chest Wall:    No tenderness or deformity    Heart:    Regular rate and rhythm, S1 and S2 normal, no murmur, rub   or gallop   Abdomen:     Soft, non-tender, bowel sounds active all four quadrants,     no masses, no organomegaly, no ascitis   Rectal:    Planned at time of C scope   Extremities:   Extremities normal, atraumatic, no cyanosis or edema   Pulses:   2+ and symmetric all extremities   Skin:   Skin color, texture, turgor normal, no rashes or lesions   Lymph nodes:   No abnormality   Neurologic:   No focal deficits, moving all four extremities      ASA Grade:  ASA 3 - Patient with moderate systemic disease with functional limitations  Air Way: As per Pre-procedure Anesthesia note.       ASSESSMENT AND PLAN:    1.  Patient is a 63 y.o. male here for colonoscopy with Anesthesia.   2.  Procedure options, risks and benefits reviewed with patient.  Patient expresses understanding.    Alm Bustard, MD  02/23/2024  12:08 PM

## 2024-02-23 NOTE — Discharge Instructions (Signed)
COLONOSCOPY    DR.__AHMED EDHI________________________________    OFFICE NUMBER___[phone]_____________________    FOLLOW UP APPOINTMENT  AS NEEDED. CALL DR. Jiles Prows OFFICE IN ONE WEEK FOR POLYP BIOPSY RESULTS.     REPEAT PROCEDURE IN ___7___YEARS.    TEST ORDERED: NONE     What to expect at home:  Your Recovery   Your doctor will tell you when you can eat and do your other usual activities Your doctor will talk to you about when you will need your next colonoscopy. Your doctor can help you decide how often you need to be checked. This will depend on the results of your test and your risk for colorectal cancer.  After the test, you may be bloated or have gas pains. You may need to pass gas. If a biopsy was done or a polyp was removed, you may have streaks of blood in your stool (feces) for a few days.  This care sheet gives you a general idea about how long it will take for you to recover. But each person recovers at a different pace. Follow the steps below to get better as quickly as possible.  How can you care for yourself at home?  Activity  Rest when you feel tired.  Diet  Follow your doctor's directions for eating.  Unless your doctor has told you not to, drink plenty of fluids. This helps to replace the fluids that were lost during the colon prep.  DO NOT DRINK ALCOHOL.  Medicines  Your doctor will tell you if and when you can restart your medicines. He or she will also give you instructions about taking any new medicines.  If you take blood thinners, such as warfarin (Coumadin), clopidogrel (Plavix), or aspirin, be sure to talk to your doctor. He or she will tell you if and when to start taking those medicines again. Make sure that you understand exactly what your doctor wants you to do.  If polyps were removed or a biopsy was done during the test, your doctor may tell you not to take aspirin or other anti-inflammatory medicines for a few days. These include ibuprofen (Advil, Motrin) and naproxen  (Aleve).  Other instructions: Anethesia  For your safety, do not drive or operate machinery for 24 hours.  Do not sign legal documents or make major decisions for 24 hours. The anesthesia can make it hard for you to fully understand what you are agreeing to.      Follow-up care is a key part of your treatment and safety. Be sure to make and go to all appointments, and call your doctor if you are having problems. It's also a good idea to know your test results and keep a list of the medicines you take.  When should you call for help?  [facility] [phone] OR [facility]  [facility] [phone]  Call 911 anytime you think you may need emergency care. For example, call if:  You passed out (lost consciousness).  You pass maroon or bloody stools.  You have severe belly pain.  Call your doctor now or seek immediate medical care if:  Your stools are black and tarlike.  Your stools have streaks of blood, but you did not have a biopsy or any polyps removed.  You have belly pain, or your belly is swollen and firm.  You vomit.  You have a fever.  You are very dizzy.  Watch closely for changes in your health, and be sure to contact your doctor  if you have any problems.  Where can you learn more?  Go to [email or web address] and sign in to your MyChart account. Enter E264 in the [place] box to learn more about "Colonoscopy: What to Expect at Home."    If you do not have an account, please click on the "Sign Up Now" link.   2006-2016 [employer]. Care instructions adapted under license by [facility]. This care instruction is for use with your licensed healthcare professional. If you have questions about a medical condition or this instruction, always ask your healthcare professional. [place] any warranty or liability for your use of this information.  Content Version: 10.9.538570; Current as of: September 29, 2014            [facility]  [phone]    Do not drive, work around [place] or use equipment.  Do not drink any alcoholic beverages.  Do not smoke while alone.  Avoid making important decisions.  Plan to spend a quiet, relaxed evening @ home.  Resume normal activities as you begin to feel better.  Eat lightly for your first meal, then gradually increase your diet to what is normal for you.  In case of nausea, avoid food and drink only clear liquids.  Resume food as nausea ceases.  Notify your surgeon if you experience fever, chills, large amount of bleeding, difficulty breathing, persistent nausea and vomiting or any other disturbing problem.  Call for a follow-up appointment with your surgeon.

## 2024-02-23 NOTE — Progress Notes (Signed)
 1337 Pt returned to room from  endo   Report received from Staci  Pt A&O, call light placed within reach, side rails up x's 2  1340 Pt given beverage of choice  1400 Assisted pt up in room  1415 Discharge instructions given to pt, voiced understanding  1425 Pt escorted to main entrance via wheelchair for discharge with friend.

## 2024-02-23 NOTE — Anesthesia Post-Procedure Evaluation (Signed)
 Department of Anesthesiology  Postprocedure Note    Patient: Joe Avery  MRN: [id number]  Birthdate: 07/24/1961  Date of evaluation: 02/23/2024    Procedure Summary       Date: 02/23/24 Room / Location: [facility] ENDO 02 / [facility]    Anesthesia Start: 1300 Anesthesia Stop: 1326    Procedure: COLONOSCOPY POLYPECTOMY SNARE/BIOPSY Diagnosis:       Colon cancer screening      (Colon cancer screening [Z12.11])    Surgeons: Alm Bustard, MD Responsible Provider: Cranston Neighbor, MD    Anesthesia Type: MAC ASA Status: 3            Anesthesia Type: MAC    Aldrete Phase I: Aldrete Score: 10    Aldrete Phase II:      Anesthesia Post Evaluation    Patient location during evaluation: bedside  Patient participation: complete - patient participated  Level of consciousness: awake  Pain score: 0  Airway patency: patent  Nausea & Vomiting: no nausea and no vomiting  Cardiovascular status: hemodynamically stable  Respiratory status: acceptable, room air and spontaneous ventilation  Hydration status: euvolemic  Pain management: adequate    There were no known notable events for this encounter.

## 2024-02-24 LAB — SURGICAL PATHOLOGY REPORT

## 2024-04-19 ENCOUNTER — Ambulatory Visit: Admit: 2024-04-19 | Discharge: 2024-04-19 | Payer: MEDICARE | Attending: Family | Primary: Internal Medicine

## 2024-04-19 VITALS — BP 132/68 | HR 104 | Wt 119.2 lb

## 2024-04-19 DIAGNOSIS — G5621 Lesion of ulnar nerve, right upper limb: Secondary | ICD-10-CM

## 2024-04-19 NOTE — Progress Notes (Cosign Needed)
 04/19/24    Joe Avery  [birth year]    Chief Complaint   Patient presents with    Follow-up     Patient here for 2 month f/u on right ulnar neuropathy       History of Present Illness  Joe Avery is a 63 y.o. male presenting today for follow-up of: History of stroke, right ulnar neuropathy,  lumbosacral radiculopathy, peripheral neuropathy.     EMG of the lower extremities showed probable mild chronic right S1 sacral radiculopathy with evidence of chronic axon loss with motor unit reinnervation into right lower limb muscles corresponding to the S1 nerve root level.  There is no ongoing evidence of axonal loss.  Findings are likely asymptomatic and unrelated.  He also completed physical therapy.  Last visit, Dr. Lincoln Renshaw ordered MRI of the lumbar spine to assess for bilateral L4 nerve root compression.  MRI of the cervical and thoracic spine was ordered to look for spinal cord injury which could explain his bilateral lower extremity pain, right leg spasticity, bilateral lower extremity distal sensory deficits.  All without significant canal or foraminal stenosis.  He continues gabapentin  100 mg nightly-it does cause him to feel sedated though.     He was referred to Dr. Buzz Cass for lumbosacral radiculopathy.  His gabapentin  was increased last visit to 200 mg nightly for neuropathic pain.      Dr. Lincoln Renshaw diagnosed him with right ulnar neuropathy.  Recommended avoiding pressure along the course of the ulnar nerve.  Also reviewed MRI brain which showed remote lacunar infarct.  Was recommended to continue aspirin  and Lipitor  daily as well as smoking cessation.  CTA head and neck 12/12/2023 nonacute.    Today, he tells me that he has not fallen.  He does feel weak in the legs.  He is not interested in physical therapy.  He would like to continue gabapentin  100 mg twice daily for neuropathic pain.    Current Outpatient Medications   Medication Sig Dispense Refill    aspirin  81 MG chewable tablet Take 1 tablet by mouth daily 30 tablet  3    atorvastatin  (LIPITOR ) 40 MG tablet Take 1 tablet by mouth nightly 30 tablet 3    metFORMIN (GLUCOPHAGE) 500 MG tablet Take 1 tablet by mouth 2 times daily (with meals)      raNITIdine HCl (RANITIDINE 150 MAX STRENGTH PO) Take by mouth as needed      Aspirin -Acetaminophen-Caffeine (EXCEDRIN PO) Take by mouth as needed      gabapentin  (NEURONTIN ) 100 MG capsule Take 1 capsule by mouth in the morning and at bedtime. 60 capsule 11    amLODIPine  (NORVASC ) 10 MG tablet Take 1 tablet by mouth daily      lisinopril  (PRINIVIL ;ZESTRIL ) 40 MG tablet Take 1 tablet by mouth daily       No current facility-administered medications for this visit.     BP 132/68   Pulse (!) 104   Wt 54.1 kg (119 lb 3.2 oz)   SpO2 96%   BMI 17.60 kg/m   Physical Exam:  Mental Status: A&O to self, location, month and year, NAD, speech clear, language fluent, repetition and naming intact, follows commands appropriately     Cranial Nerve Exam:   CN II-XII: PERRL, VFF, no nystagmus, no gaze paresis, sensation V1-V3 intact b/l, muscles of facial expression symmetric; hearing intact to conversational tone, palate elevates symmetrically, shoulder elevation symmetric and tongue protrudes midline with movement side to side.     Motor Exam:  Strength 5/5 UE's/LE's b/l  Spasticity noted RLE  No pronator drift     Deep Tendon Reflexes: 2/4 brachioradialis, 2+ patellar BL, 2/4 L and 0/4 R  achilles b/l. + Hoffman BL.  Without ankle clonus BL.     Sensation: Intact light touch UE's/LE's b/l  BL 4/5 distribution decreased temp   BL LE decreased temp, vib, pin     Coordination/Cerebellum:       Tremors--none      Finger-to-Nose: no dysmetria b/l     Gait and stance:      Gait: Unsteady, walks with knees bent    ASSESSMENT/PLAN:       Diagnosis Orders   1. Ulnar neuropathy at elbow of right upper extremity        2. Idiopathic peripheral neuropathy        3. Lumbosacral radiculopathy        Joe Avery was seen in neurological follow up in regards to  lumbosacral radiculopathy, peripheral neuropathy.   - He is not interested in PT for strength and balance.  Will continue gabapentin  1 tablet twice daily for neuropathic pain.  - He continues aspirin  and statin for secondary stroke prevention.    Return in about 6 months (around 10/19/2024).    Joe Harlem, APRN - CNP

## 2024-05-06 LAB — BASIC METABOLIC PANEL
BUN/Creatinine Ratio: 15 (ref 10–24)
BUN: 20 mg/dL (ref 8–27)
CO2: 25 mmol/L (ref 20–29)
Calcium: 9.9 mg/dL (ref 8.6–10.2)
Chloride: 100 mmol/L (ref 96–106)
Creatinine: 1.34 mg/dL — ABNORMAL HIGH (ref 0.76–1.27)
Estimated Glomerular Filtration Rate Creatinine Equation: 61 mL/min/{1.73_m2} (ref >59)
Glucose: 149 mg/dL — ABNORMAL HIGH (ref 70–99)
Potassium: 4.2 mmol/L (ref 3.5–5.2)
Sodium: 139 mmol/L (ref 134–144)

## 2024-05-06 LAB — CBC WITH AUTO DIFFERENTIAL
Basophils %: 2 %
Basophils Absolute: 0.1 10*3/uL (ref 0.0–0.2)
Eosinophils %: 4 %
Eosinophils Absolute: 0.3 10*3/uL (ref 0.0–0.4)
Hematocrit: 41.3 % (ref 37.5–51.0)
Hemoglobin: 13.1 g/dL (ref 13.0–17.7)
Immature Grans (Abs): 0 10*3/uL (ref 0.0–0.1)
Immature Granulocytes %: 0 %
Lymphocytes %: 19 %
Lymphocytes Absolute: 1.1 10*3/uL (ref 0.7–3.1)
MCH: 29.2 pg (ref 26.6–33.0)
MCHC: 31.7 g/dL (ref 31.5–35.7)
MCV: 92 fL (ref 79–97)
Monocytes %: 8 %
Monocytes Absolute: 0.5 10*3/uL (ref 0.1–0.9)
Neutrophils %: 67 %
Neutrophils Absolute: 4.1 10*3/uL (ref 1.4–7.0)
Platelets: 224 10*3/uL (ref 150–450)
RBC: 4.49 x10E6/uL (ref 4.14–5.80)
RDW: 12.6 % (ref 11.6–15.4)
WBC: 6.1 10*3/uL (ref 3.4–10.8)

## 2024-05-06 LAB — PTH, INTACT: Pth Intact: 26 pg/mL (ref 15–65)

## 2024-05-06 LAB — URINALYSIS
Bilirubin, Urine: NEGATIVE
Blood, Urine: NEGATIVE
Glucose, Ur: NEGATIVE
Ketones, Urine: NEGATIVE
Leukocyte Esterase, Urine: NEGATIVE
Nitrite, Urine: NEGATIVE
Protein, UA: NEGATIVE
Specific Gravity, UA: 1.016 (ref 1.005–1.030)
Urobilinogen, Urine: 0.2 mg/dL (ref 0.2–1.0)
pH, Urine: 6 (ref 5.0–7.5)

## 2024-05-06 LAB — CREATININE URINE, + PROTEIN URINE
Creatinine, Ur: 105.2 mg/dL
PROTEIN/CREAT RATIO URINE RAN: 73 mg/g{creat} (ref 0–200)
Protein, Urine, Random: 7.7 mg/dL

## 2024-05-06 LAB — ALBUMIN: Albumin: 4.6 g/dL (ref 3.8–4.9)

## 2024-05-06 LAB — PHOSPHORUS: Phosphorus: 2.8 mg/dL (ref 2.8–4.1)

## 2024-05-06 LAB — MAGNESIUM: Magnesium: 1.7 mg/dL (ref 1.6–2.3)

## 2024-05-18 ENCOUNTER — Ambulatory Visit: Admit: 2024-05-18 | Discharge: 2024-05-18 | Payer: MEDICARE | Attending: Nephrology | Primary: Internal Medicine

## 2024-05-18 VITALS — BP 142/70 | HR 93 | Resp 16 | Ht 69.0 in | Wt 121.6 lb

## 2024-05-18 DIAGNOSIS — I10 Essential (primary) hypertension: Principal | ICD-10-CM

## 2024-05-18 NOTE — Progress Notes (Signed)
 Patient:   Joe Avery    Date:  05/18/24  DOB:  09-12-1961,   Nephrologist: ALRIC DELENA SNOW, MD  Provider: Fernand Ruffing, MD    Chief Complaint/Reason for Consult:   follow-up renal      HISTORY OF PRESENT ILLNESS:   The patient is a 63 y.o. male who presents with  concern for stage III kidney disease creatinine is to be around 1.2 is increased to 1.4 wanted reevaluation by nephrology.  He has had diabetes diagnosed less than 1 year A1c closer to 7-8 range slightly high blood pressure but also anxious today as he is here for his visit as noted in the past have slightly iron deficiency and low vitamin D  levels.  Although he is off the hydrochlorothiazide he still on the lisinopril  minimal proteinuria with that medication potassium has been stable he is fairly skinny tries to take good care of himself and with above changes was asked to be seen by nephrology.     here today for follow-up still has some issues of sleeping at night with insomnia blood pressure is fairly well-controlled has some level of fatigue but no other complaints renal functions actually slightly improved back to baseline and denies any new other acute issues eats and drinks but does not have much appetite in general weight has been holding stable     here today for follow-up still smoking denies any other complaints seen today in July night no fevers chills or chest pain      Past Medical History:        Diagnosis Date    Hypertension     Type 2 diabetes mellitus without complication, without long-term current use of insulin  (HCC) 08/11/2023       Past Surgical History:        Procedure Laterality Date    COLONOSCOPY N/A 02/23/2024    COLONOSCOPY POLYPECTOMY SNARE/BIOPSY performed by Madie Dine, MD at [facility] ENDOSCOPY       Medications:   Current Outpatient Medications   Medication Sig Dispense Refill    atorvastatin  (LIPITOR ) 40 MG tablet Take 1 tablet by mouth nightly 30 tablet 3    metFORMIN (GLUCOPHAGE) 500 MG tablet Take 1 tablet by mouth  daily (with breakfast)      raNITIdine HCl (RANITIDINE 150 MAX STRENGTH PO) Take by mouth as needed      Aspirin -Acetaminophen-Caffeine (EXCEDRIN PO) Take by mouth as needed      gabapentin  (NEURONTIN ) 100 MG capsule Take 1 capsule by mouth in the morning and at bedtime. 60 capsule 11    amLODIPine  (NORVASC ) 10 MG tablet Take 1 tablet by mouth daily      lisinopril  (PRINIVIL ;ZESTRIL ) 40 MG tablet Take 1 tablet by mouth daily      aspirin  81 MG chewable tablet Take 1 tablet by mouth daily (Patient not taking: Reported on 05/18/2024) 30 tablet 3     No current facility-administered medications for this visit.        Allergies:  Patient has no known allergies.    Social History:   TOBACCO:   reports that he has been smoking cigarettes. He started smoking about 44 years ago. He has a 21.7 pack-year smoking history. He has never used smokeless tobacco.  ETOH:   reports no history of alcohol use.  OCCUPATION:      Family History:       Problem Relation Age of Onset    Diabetes Father        REVIEW OF  SYSTEMS:  Negative except for   No fevers chills chest pain shortness of breath positive diabetes.      Vitals: BP (!) 142/70   Pulse 93   Resp 16   Ht 1.753 m (5' 9)   Wt 55.2 kg (121 lb 9.6 oz)   SpO2 96%   BMI 17.96 kg/m   General appearance: awake weak  HEENT: Head: Normal, normocephalic, atraumatic.  Neck: supple, symmetrical, trachea midline  Lungs: diminished breath sounds bilaterally  Heart: S1, S2 normal  Abdomen: abnormal findings:  soft NT  Extremities: edema none  Neurologic: Mental status: alertness: alert    LABS:  LABS:  CBC:   Lab Results   Component Value Date/Time    WBC 6.1 05/05/2024 10:29 AM    WBC 5.9 12/13/2023 05:01 AM    WBC 8.6 12/12/2023 12:40 PM    HGB 13.1 05/05/2024 10:29 AM    HGB 12.7 12/13/2023 05:01 AM    HGB 12.6 12/12/2023 12:40 PM    PLT 224 05/05/2024 10:29 AM    PLT 225 12/13/2023 05:01 AM    PLT 222 12/12/2023 12:40 PM     Renal Panel:   Lab Results   Component Value Date/Time     NA 139 05/05/2024 10:29 AM    NA 141 01/04/2024 10:05 AM    NA 140 12/13/2023 05:01 AM    K 4.2 05/05/2024 10:29 AM    K 4.3 01/04/2024 10:05 AM    K 4.1 12/13/2023 05:01 AM    CL 100 05/05/2024 10:29 AM    CL 102 01/04/2024 10:05 AM    CL 105 12/13/2023 05:01 AM    CO2 25 05/05/2024 10:29 AM    CO2 25 01/04/2024 10:05 AM    CO2 24 12/13/2023 05:01 AM    BUN 20 05/05/2024 10:29 AM    BUN 17 01/04/2024 10:05 AM    BUN 23 12/13/2023 05:01 AM    CREATININE 1.34 05/05/2024 10:29 AM    CREATININE 1.23 01/04/2024 10:05 AM    CREATININE 1.1 12/13/2023 05:01 AM    LABGLOM 66 12/13/2023 05:01 AM    LABGLOM 63 12/12/2023 12:40 PM    LABALBU SPECIMEN QUANTITY NOT SUFFICIENT 06/11/2023 02:51 PM       Lipids:   Lab Results   Component Value Date/Time    CHOL 127 12/13/2023 05:01 AM    HDL 38 12/13/2023 05:01 AM     INR:   Lab Results   Component Value Date/Time    INR 1.0 12/12/2023 12:40 PM     Calcium :    Lab Results   Component Value Date/Time    CALCIUM  9.9 05/05/2024 10:29 AM     Phosphorus:    Lab Results   Component Value Date/Time    PHOS 2.8 05/05/2024 10:29 AM       U/A:    Lab Results   Component Value Date/Time    PROTEINU Negative 05/05/2024 10:29 AM    NITRU Negative 05/05/2024 10:29 AM    COLORU Yellow 05/05/2024 10:29 AM    PHUR 6.0 05/05/2024 10:29 AM    LABCAST None seen 01/04/2024 10:05 AM    RBCUA 3-10 01/04/2024 10:05 AM    BACTERIA None seen 01/04/2024 10:05 AM    CLARITYU Clear 05/05/2024 10:29 AM    UROBILINOGEN 0.2 05/05/2024 10:29 AM    BILIRUBINUR Negative 05/05/2024 10:29 AM    BLOODU Negative 05/05/2024 10:29 AM    GLUCOSEU Negative 05/05/2024 10:29 AM    KETUA  Negative 05/05/2024 10:29 AM       HgBA1c:    Lab Results   Component Value Date/Time    LABA1C 7.3 01/04/2024 10:05 AM     TSH:  No results found for: TSH  IRON:    Lab Results   Component Value Date/Time    IRON 53 06/11/2023 02:51 PM     Iron Saturation:  No components found for: PERCENTFE  TIBC:    Lab Results   Component Value  Date/Time    TIBC 249 06/11/2023 02:51 PM     FERRITIN:  No results found for: FERRITIN  RPR:  No results found for: RPR  ANA:  No results found for: ANATITER, ANA     lab work on July 03, 2023 creatinine 1.46 GFR 55 sodium 139 potassium 4.7 A1c 7.3 microalbumin to creatinine ratio 6    10/09/23  Hb 13.9 ua no protein no blood urine prot/creat 91 mg spep and upep negative c3 c4 normal alb 4.7 ana negative tsh 1.2    IMAGING:    .SABRA     IMPRESSION:    1. Primary hypertension    2. Persistent proteinuria    3. Chronic kidney disease, stage II (mild)    4. Type 2 diabetes mellitus without complication, without long-term current use of insulin  (HCC)         PLAN:   1 blood pressure slightly increased but also gets anxious little bit monitor for now supportive care   #2 no significant proteinuria   #3 stage II kidney disease no significant protein in the urine we will monitor   #4 last A1c 7.3 on metformin as well monitor control sugar   try to quit smoking    INSTRUCTIONS:   #1 low-salt diet   #2 stay hydrated more fluids   #3 watch for uti   #4 avoid Aleve for pain   #5 increase fluid intake !!!!   #6 control sugar   #60fu 6 months  8 try quit smoking      Thank you for letting me help in the care of your patient and please feel free to call me if any concerns or questions.    Electronically signed by ALRIC DELENA SNOW, MD on 05/18/2024 at 11:52 AM

## 2024-05-18 NOTE — Patient Instructions (Signed)
 INSTRUCTIONS:   #1 low-salt diet   #2 stay hydrated more fluids   #3 watch for uti   #4 avoid Aleve for pain   #5 increase fluid intake !!!!   #6 control sugar   #66fu 6 months  8 try quit smoking

## 2024-09-12 ENCOUNTER — Inpatient Hospital Stay
Admission: EM | Admit: 2024-09-12 | Discharge: 2024-09-13 | Disposition: A | Discharge status: Still In Hospital | Payer: MEDICARE | Admitting: Student in an Organized Health Care Education/Training Program

## 2024-09-12 ENCOUNTER — Inpatient Hospital Stay: Admit: 2024-09-12 | Payer: MEDICARE | Primary: Internal Medicine

## 2024-09-12 ENCOUNTER — Emergency Department: Admit: 2024-09-12 | Payer: MEDICARE | Primary: Internal Medicine

## 2024-09-12 DIAGNOSIS — I70222 Atherosclerosis of native arteries of extremities with rest pain, left leg: Principal | ICD-10-CM

## 2024-09-12 DIAGNOSIS — I743 Embolism and thrombosis of arteries of the lower extremities: Secondary | ICD-10-CM

## 2024-09-12 LAB — COMPREHENSIVE METABOLIC PANEL
ALT: 14 U/L (ref 10–40)
AST: 20 U/L (ref 15–37)
Albumin/Globulin Ratio: 1.7 (ref 1.1–2.2)
Albumin: 4.7 g/dL (ref 3.4–5.0)
Alkaline Phosphatase: 135 U/L — ABNORMAL HIGH (ref 40–129)
Anion Gap: 40 mmol/L — ABNORMAL HIGH (ref 9–17)
BUN: 56 mg/dL — ABNORMAL HIGH (ref 7–20)
CO2: 12 mmol/L — CL (ref 21–32)
Calcium: 10.4 mg/dL (ref 8.3–10.6)
Chloride: 84 mmol/L — ABNORMAL LOW (ref 99–110)
Creatinine: 2.4 mg/dL — ABNORMAL HIGH (ref 0.8–1.3)
Est, Glom Filt Rate: 28 mL/min/1.73m2 — ABNORMAL LOW (ref >60)
Glucose: 753 mg/dL (ref 74–99)
Potassium: 3.5 mmol/L (ref 3.5–5.1)
Sodium: 136 mmol/L (ref 136–145)
Total Bilirubin: 0.5 mg/dL (ref 0.0–1.0)
Total Protein: 7.5 g/dL (ref 6.4–8.2)

## 2024-09-12 LAB — CBC WITH AUTO DIFFERENTIAL
Basophils %: 1 % (ref 0–1)
Basophils Absolute: 0.07 k/uL
Eosinophils %: 0 % (ref 0.0–3.0)
Eosinophils Absolute: 0 k/uL
Hematocrit: 46.6 % (ref 42.0–52.0)
Hemoglobin: 15.5 g/dL (ref 13.5–18.0)
Immature Granulocytes %: 1 % — ABNORMAL HIGH
Immature Granulocytes Absolute: 0.07 k/uL
Lymphocytes %: 7 % — ABNORMAL LOW (ref 24–44)
Lymphocytes Absolute: 1.04 k/uL
MCH: 29.3 pg (ref 27.0–31.0)
MCHC: 33.3 g/dL (ref 32.0–36.0)
MCV: 88.1 fL (ref 78.0–100.0)
MPV: 12.7 fL — ABNORMAL HIGH (ref 7.5–11.1)
Monocytes %: 3 % (ref 0–5)
Monocytes Absolute: 0.49 k/uL
Neutrophils %: 89 % — ABNORMAL HIGH (ref 36–66)
Neutrophils Absolute: 12.96 k/uL
Platelets: 276 k/uL (ref 140–440)
RBC: 5.29 m/uL (ref 4.60–6.20)
RDW: 12.4 % (ref 11.7–14.9)
WBC: 14.6 k/uL — ABNORMAL HIGH (ref 4.0–10.5)

## 2024-09-12 LAB — CBC
Hematocrit: 38.8 % — ABNORMAL LOW (ref 42.0–52.0)
Hematocrit: 39.9 % — ABNORMAL LOW (ref 42.0–52.0)
Hemoglobin: 13.6 g/dL (ref 13.5–18.0)
Hemoglobin: 13.7 g/dL (ref 13.5–18.0)
MCH: 29.5 pg (ref 27.0–31.0)
MCH: 28.9 pg (ref 27.0–31.0)
MCHC: 35.1 g/dL (ref 32.0–36.0)
MCHC: 34.3 g/dL (ref 32.0–36.0)
MCV: 84.2 fL (ref 78.0–100.0)
MCV: 84.2 fL (ref 78.0–100.0)
MPV: 12.5 fL — ABNORMAL HIGH (ref 7.5–11.1)
MPV: 12.6 fL — ABNORMAL HIGH (ref 7.5–11.1)
Platelets: 227 k/uL (ref 140–440)
Platelets: 239 k/uL (ref 140–440)
RBC: 4.61 m/uL (ref 4.60–6.20)
RBC: 4.74 m/uL (ref 4.60–6.20)
RDW: 12.3 % (ref 11.7–14.9)
RDW: 12.3 % (ref 11.7–14.9)
WBC: 17.7 k/uL — ABNORMAL HIGH (ref 4.0–10.5)
WBC: 17 k/uL — ABNORMAL HIGH (ref 4.0–10.5)

## 2024-09-12 LAB — EKG 12-LEAD
Atrial Rate: 99 {beats}/min
Diagnosis: NORMAL
P Axis: 81 degrees
P-R Interval: 112 ms
Q-T Interval: 386 ms
QRS Duration: 92 ms
QTc Calculation (Bazett): 495 ms
R Axis: 110 degrees
T Axis: 85 degrees
Ventricular Rate: 99 {beats}/min

## 2024-09-12 LAB — BLOOD GAS, VENOUS
Carboxyhemoglobin: 0.8 % (ref 0.5–1.5)
HCO3, Venous: 16.6 mmol/L — ABNORMAL LOW (ref 22–29)
Methemoglobin: 0.4 % — ABNORMAL LOW (ref 0.5–1.5)
Negative Base Excess, Ven: 9.7 mmol/L — ABNORMAL HIGH (ref 0–3)
Oxyhemoglobin: 56.4 %
PO2, Ven: 34 mmHg (ref 23–48)
Pt Temp: 37
pCO2, Ven: 38 mmHg (ref 38–54)
pH, Ven: 7.259 — ABNORMAL LOW (ref 7.320–7.430)

## 2024-09-12 LAB — ECHO (TTE) COMPLETE (PRN CONTRAST/BUBBLE/STRAIN/3D)
AV Area by Peak Velocity: 2.9 cm2
AV Area by VTI: 2.7 cm2
AV Mean Gradient: 3 mmHg
AV Mean Velocity: 0.8 m/s
AV Peak Gradient: 5 mmHg
AV Peak Velocity: 1.1 m/s
AV VTI: 15.6 cm
AV Velocity Ratio: 1.09
AVA/BSA Peak Velocity: 1.9 cm2/m2
AVA/BSA VTI: 1.7 cm2/m2
Body Surface Area: 1.5 m2
EF Physician: 55 %
Fractional Shortening 2D: 39 % (ref 28–44)
IVC Proxmal: 0.9 cm
IVSd: 0.8 cm (ref 0.6–1.0)
LA Diameter: 1.3 cm
LA Size Index: 0.84 cm/m2
LV EDV A4C: 37 mL
LV EDV Index A4C: 24 mL/m2
LV ESV A4C: 24 mL
LV ESV Index A4C: 15 mL/m2
LV Ejection Fraction A4C: 36 %
LV Mass 2D Index: 36.6 g/m2 — AB (ref 49–115)
LV Mass 2D: 56.8 g — AB (ref 88–224)
LV RWT Ratio: 0.45
LVIDd Index: 2 cm/m2
LVIDd: 3.1 cm — AB (ref 4.2–5.9)
LVIDs Index: 1.23 cm/m2
LVIDs: 1.9 cm
LVOT Area: 2.8 cm2
LVOT Diameter: 1.9 cm
LVOT Mean Gradient: 3 mmHg
LVOT Peak Gradient: 5 mmHg
LVOT Peak Velocity: 1.2 m/s
LVOT SV: 42 mL
LVOT Stroke Volume Index: 27.1 mL/m2
LVOT VTI: 14.8 cm
LVOT:AV VTI Index: 0.95
LVPWd: 0.7 cm (ref 0.6–1.0)
RV Mid Dimension: 2.5 cm
TR Max Velocity: 2.09 m/s
TR Peak Gradient: 17 mmHg

## 2024-09-12 LAB — BASIC METABOLIC PANEL
Anion Gap: 22 mmol/L — ABNORMAL HIGH (ref 9–17)
Anion Gap: 26 mmol/L — ABNORMAL HIGH (ref 9–17)
BUN: 53 mg/dL — ABNORMAL HIGH (ref 7–20)
BUN: 53 mg/dL — ABNORMAL HIGH (ref 7–20)
CO2: 20 mmol/L — ABNORMAL LOW (ref 21–32)
CO2: 17 mmol/L — ABNORMAL LOW (ref 21–32)
Calcium: 9.2 mg/dL (ref 8.3–10.6)
Calcium: 9.1 mg/dL (ref 8.3–10.6)
Chloride: 102 mmol/L (ref 99–110)
Chloride: 101 mmol/L (ref 99–110)
Creatinine: 1.8 mg/dL — ABNORMAL HIGH (ref 0.8–1.3)
Creatinine: 1.8 mg/dL — ABNORMAL HIGH (ref 0.8–1.3)
Est, Glom Filt Rate: 38 mL/min/1.73m2 — ABNORMAL LOW (ref >60)
Est, Glom Filt Rate: 39 mL/min/1.73m2 — ABNORMAL LOW (ref >60)
Glucose: 522 mg/dL (ref 74–99)
Glucose: 501 mg/dL (ref 74–99)
Potassium: 4.9 mmol/L (ref 3.5–5.1)
Potassium: 4.4 mmol/L (ref 3.5–5.1)
Sodium: 143 mmol/L (ref 136–145)
Sodium: 144 mmol/L (ref 136–145)

## 2024-09-12 LAB — POCT GLUCOSE
POC Glucose: 477 mg/dL (ref 74–99)
POC Glucose: 464 mg/dL (ref 74–99)
POC Glucose: 347 mg/dL — ABNORMAL HIGH (ref 74–99)
POC Glucose: 296 mg/dL — ABNORMAL HIGH (ref 74–99)

## 2024-09-12 LAB — HEMOGLOBIN A1C
Estimated Avg Glucose: 289 mg/dL
Hemoglobin A1C: 11.7 % — ABNORMAL HIGH (ref 4.2–6.3)

## 2024-09-12 LAB — CK: Total CK: 79 U/L (ref 26–192)

## 2024-09-12 LAB — MAGNESIUM
Magnesium: 3.2 mg/dL — ABNORMAL HIGH (ref 1.8–2.4)
Magnesium: 2.8 mg/dL — ABNORMAL HIGH (ref 1.8–2.4)

## 2024-09-12 LAB — PHOSPHORUS: Phosphorus: 3 mg/dL (ref 2.5–4.9)

## 2024-09-12 LAB — LACTIC ACID
Lactic Acid: 11.7 mmol/L (ref 0.4–2.0)
Lactic Acid: 3.7 mmol/L (ref 0.4–2.0)

## 2024-09-12 LAB — ANTI-XA, HEPARIN
Anti-XA Unfrac Heparin: <0.1 IU/L
Anti-XA Unfrac Heparin: >1.1 IU/L
Anti-XA Unfrac Heparin: >1.1 IU/L

## 2024-09-12 LAB — TSH: TSH: 0.46 u[IU]/mL (ref 0.27–4.20)

## 2024-09-12 LAB — BETA-HYDROXYBUTYRATE
Beta-Hydroxybutyrate: 6.48 mmol/L — ABNORMAL HIGH (ref 0.00–0.27)
Beta-Hydroxybutyrate: 5.74 mmol/L — ABNORMAL HIGH (ref 0.00–0.27)

## 2024-09-12 LAB — FIBRINOGEN
Fibrinogen: 325 mg/dL (ref 170–540)
Fibrinogen: 323 mg/dL (ref 170–540)

## 2024-09-12 LAB — PROTIME-INR
INR: 1.2
Protime: 15.5 s — ABNORMAL HIGH (ref 11.7–14.5)

## 2024-09-12 LAB — APTT
APTT: 21.8 s — ABNORMAL LOW (ref 25.1–37.1)
APTT: >240 s — ABNORMAL HIGH (ref 25.1–37.1)

## 2024-09-12 MED ORDER — SODIUM CHLORIDE 0.9 % IV SOLN
0.9 | INTRAVENOUS | Status: AC | PRN
Start: 2024-09-12 — End: 2024-09-13
  Administered 2024-09-13: 12:00:00 via INTRAVENOUS
  Administered 2024-09-13: 13:00:00 20 via INTRAVENOUS

## 2024-09-12 MED ORDER — IOPAMIDOL 76 % IV SOLN
76 | INTRAVENOUS | Status: AC
Start: 2024-09-12 — End: 2024-09-12

## 2024-09-12 MED ORDER — HEPARIN (PORCINE) IN NACL 1000-0.9 UT/500ML-% IV SOLN
1000-0.9 | INTRAVENOUS | Status: AC
Start: 2024-09-12 — End: 2024-09-12

## 2024-09-12 MED ORDER — ASPIRIN 81 MG PO CHEW
81 | Freq: Every day | ORAL | Status: DC
Start: 2024-09-12 — End: 2024-09-12

## 2024-09-12 MED ORDER — ONDANSETRON HCL 4 MG/2ML IJ SOLN
4 | Freq: Four times a day (QID) | INTRAMUSCULAR | Status: DC | PRN
Start: 2024-09-12 — End: 2024-09-12

## 2024-09-12 MED ORDER — HYDRALAZINE HCL 20 MG/ML IJ SOLN
20 | INTRAMUSCULAR | Status: DC | PRN
Start: 2024-09-12 — End: 2024-09-13

## 2024-09-12 MED ORDER — NORMAL SALINE FLUSH 0.9 % IV SOLN
0.9 | INTRAVENOUS | Status: DC | PRN
Start: 2024-09-12 — End: 2024-09-12

## 2024-09-12 MED ORDER — POLYETHYLENE GLYCOL 3350 17 G PO PACK
17 | Freq: Every day | ORAL | Status: DC | PRN
Start: 2024-09-12 — End: 2024-09-13

## 2024-09-12 MED ORDER — LIDOCAINE HCL 2 % IJ SOLN
2 | INTRAMUSCULAR | Status: AC
Start: 2024-09-12 — End: 2024-09-12

## 2024-09-12 MED ORDER — SODIUM CHLORIDE 0.9 % IV SOLN
0.9 | INTRAVENOUS | Status: DC | PRN
Start: 2024-09-12 — End: 2024-09-12

## 2024-09-12 MED ORDER — HEPARIN SODIUM (PORCINE) 1000 UNIT/ML IJ SOLN
1000 | Freq: Once | INTRAMUSCULAR | Status: AC
Start: 2024-09-12 — End: 2024-09-12
  Administered 2024-09-12: 15:00:00 2800 [IU]/kg via INTRAVENOUS

## 2024-09-12 MED ORDER — AMLODIPINE BESYLATE 10 MG PO TABS
10 | Freq: Every day | ORAL | Status: DC
Start: 2024-09-12 — End: 2024-09-13
  Administered 2024-09-13: 10 mg via ORAL

## 2024-09-12 MED ORDER — INSULIN REGULAR HUMAN 100 UNIT/100ML NS INFUSION
Status: DC
Start: 2024-09-12 — End: 2024-09-13
  Administered 2024-09-12: 20:00:00 8.1 [IU]/h via INTRAVENOUS

## 2024-09-12 MED ORDER — KCL IN DEXTROSE-NACL 20-5-0.45 MEQ/L-%-% IV SOLN
20-5-0.45 | INTRAVENOUS | Status: DC | PRN
Start: 2024-09-12 — End: 2024-09-13
  Administered 2024-09-13: 01:00:00 via INTRAVENOUS

## 2024-09-12 MED ORDER — INSULIN REGULAR HUMAN 100 UNIT/ML IJ SOLN
100 | Freq: Once | INTRAMUSCULAR | Status: AC
Start: 2024-09-12 — End: 2024-09-12
  Administered 2024-09-12: 21:00:00 12 [IU] via INTRAVENOUS

## 2024-09-12 MED ORDER — NORMAL SALINE FLUSH 0.9 % IV SOLN
0.9 | Freq: Two times a day (BID) | INTRAVENOUS | Status: DC
Start: 2024-09-12 — End: 2024-09-13
  Administered 2024-09-13 (×2): 10 mL via INTRAVENOUS

## 2024-09-12 MED ORDER — HEPARIN SODIUM (PORCINE) 5000 UNIT/ML IJ SOLN
5000 | INTRAMUSCULAR | Status: AC
Start: 2024-09-12 — End: 2024-09-12

## 2024-09-12 MED ORDER — HEPARIN (PORCINE) IN NACL 25000-0.45 UT/250ML-% IV SOLN
25000-0.45 | INTRAVENOUS | Status: DC
Start: 2024-09-12 — End: 2024-09-13
  Administered 2024-09-12: 15:00:00 12 [IU]/kg/h via INTRAVENOUS

## 2024-09-12 MED ORDER — ALTEPLASE 2 MG IJ SOLR
2 | INTRAMUSCULAR | Status: DC | PRN
Start: 2024-09-12 — End: 2024-09-12
  Administered 2024-09-12: 19:00:00 .5

## 2024-09-12 MED ORDER — SODIUM PHOSPHATE 3 MMOL/ML IV SOLN (MIXTURES ONLY)
3 | INTRAVENOUS | Status: DC | PRN
Start: 2024-09-12 — End: 2024-09-13

## 2024-09-12 MED ORDER — SODIUM CHLORIDE 0.9 % IV SOLN
0.9 | INTRAVENOUS | Status: DC
Start: 2024-09-12 — End: 2024-09-12

## 2024-09-12 MED ORDER — ATORVASTATIN CALCIUM 40 MG PO TABS
40 | Freq: Every evening | ORAL | Status: DC
Start: 2024-09-12 — End: 2024-09-13
  Administered 2024-09-13: 02:00:00 40 mg via ORAL

## 2024-09-12 MED ORDER — SODIUM CHLORIDE 0.9 % IV BOLUS
0.9 | Freq: Once | INTRAVENOUS | Status: DC
Start: 2024-09-12 — End: 2024-09-12

## 2024-09-12 MED ORDER — MORPHINE SULFATE (PF) 4 MG/ML IJ SOLN
4 | Freq: Once | INTRAMUSCULAR | Status: AC
Start: 2024-09-12 — End: 2024-09-12
  Administered 2024-09-12: 14:00:00 4 mg via INTRAVENOUS

## 2024-09-12 MED ORDER — HEPARIN SODIUM (PORCINE) 1000 UNIT/ML IJ SOLN
1000 | INTRAMUSCULAR | Status: DC | PRN
Start: 2024-09-12 — End: 2024-09-13

## 2024-09-12 MED ORDER — HYDROMORPHONE HCL 1 MG/ML IJ SOLN
1 | INTRAMUSCULAR | Status: DC | PRN
Start: 2024-09-12 — End: 2024-09-13
  Administered 2024-09-12 – 2024-09-13 (×3): 0.5 mg via INTRAVENOUS

## 2024-09-12 MED ORDER — ONDANSETRON HCL 4 MG/2ML IJ SOLN
4 | Freq: Four times a day (QID) | INTRAMUSCULAR | Status: DC | PRN
Start: 2024-09-12 — End: 2024-09-13
  Administered 2024-09-13: 16:00:00 4 mg via INTRAVENOUS

## 2024-09-12 MED ORDER — ACETAMINOPHEN 325 MG PO TABS
325 | Freq: Four times a day (QID) | ORAL | Status: DC | PRN
Start: 2024-09-12 — End: 2024-09-12

## 2024-09-12 MED ORDER — SODIUM CHLORIDE 0.45 % IV SOLN
0.45 | INTRAVENOUS | Status: DC
Start: 2024-09-12 — End: 2024-09-13
  Administered 2024-09-12 – 2024-09-13 (×2): via INTRAVENOUS

## 2024-09-12 MED ORDER — INSULIN REGULAR HUMAN 100 UNIT/ML IJ SOLN
100 | Freq: Once | INTRAMUSCULAR | Status: AC
Start: 2024-09-12 — End: 2024-09-12
  Administered 2024-09-12: 15:00:00 6 [IU] via INTRAVENOUS

## 2024-09-12 MED ORDER — HEPARIN SODIUM (PORCINE) 1000 UNIT/ML IJ SOLN
1000 | INTRAMUSCULAR | Status: DC | PRN
Start: 2024-09-12 — End: 2024-09-12
  Administered 2024-09-12: 18:00:00 3000 via INTRA_ARTERIAL
  Administered 2024-09-12: 18:00:00 5000 via INTRA_ARTERIAL

## 2024-09-12 MED ORDER — SODIUM CHLORIDE 0.9 % IV BOLUS
0.9 | Freq: Once | INTRAVENOUS | Status: AC
Start: 2024-09-12 — End: 2024-09-12
  Administered 2024-09-12: 15:00:00 2000 mL via INTRAVENOUS

## 2024-09-12 MED ORDER — GABAPENTIN 100 MG PO CAPS
100 | Freq: Two times a day (BID) | ORAL | Status: DC
Start: 2024-09-12 — End: 2024-09-13
  Administered 2024-09-13: 02:00:00 100 mg via ORAL

## 2024-09-12 MED ORDER — SENNOSIDES 8.6 MG PO TABS
8.6 | Freq: Every day | ORAL | Status: DC | PRN
Start: 2024-09-12 — End: 2024-09-13

## 2024-09-12 MED ORDER — NORMAL SALINE FLUSH 0.9 % IV SOLN
0.9 | INTRAVENOUS | Status: DC | PRN
Start: 2024-09-12 — End: 2024-09-13
  Administered 2024-09-13: 06:00:00 10 mL via INTRAVENOUS

## 2024-09-12 MED ORDER — POTASSIUM CHLORIDE 10 MEQ/100ML IV SOLN
10 | INTRAVENOUS | Status: DC | PRN
Start: 2024-09-12 — End: 2024-09-13

## 2024-09-12 MED ORDER — NORMAL SALINE FLUSH 0.9 % IV SOLN
0.9 | Freq: Two times a day (BID) | INTRAVENOUS | Status: DC
Start: 2024-09-12 — End: 2024-09-12

## 2024-09-12 MED ORDER — ASPIRIN 81 MG PO CHEW
81 | Freq: Every day | ORAL | Status: DC
Start: 2024-09-12 — End: 2024-09-13

## 2024-09-12 MED ORDER — HEPARIN (PORCINE) IN NACL 25000-0.45 UT/250ML-% IV SOLN
25000-0.45 | INTRAVENOUS | Status: DC
Start: 2024-09-12 — End: 2024-09-12

## 2024-09-12 MED ORDER — ONDANSETRON 4 MG PO TBDP
4 | Freq: Three times a day (TID) | ORAL | Status: DC | PRN
Start: 2024-09-12 — End: 2024-09-12

## 2024-09-12 MED ORDER — HEPARIN (PORCINE) IN NACL 25000-0.45 UT/250ML-% IV SOLN
25000-0.45 | INTRAVENOUS | Status: AC | PRN
Start: 2024-09-12 — End: 2024-09-12
  Administered 2024-09-12: 19:00:00 500 via INTRA_ARTERIAL

## 2024-09-12 MED ORDER — MAGNESIUM SULFATE 2000 MG/50 ML IVPB PREMIX
2 | INTRAVENOUS | Status: DC | PRN
Start: 2024-09-12 — End: 2024-09-13

## 2024-09-12 MED ORDER — DEXTROSE 10 % IV BOLUS
INTRAVENOUS | Status: DC | PRN
Start: 2024-09-12 — End: 2024-09-13

## 2024-09-12 MED ORDER — ACETAMINOPHEN 325 MG RE SUPP
325 | Freq: Four times a day (QID) | RECTAL | Status: DC | PRN
Start: 2024-09-12 — End: 2024-09-13

## 2024-09-12 MED ORDER — LIDOCAINE HCL 2 % IJ SOLN (MIXTURES ONLY)
2 | INTRAMUSCULAR | Status: DC | PRN
Start: 2024-09-12 — End: 2024-09-12
  Administered 2024-09-12: 17:00:00 10 via INTRADERMAL

## 2024-09-12 MED ORDER — FENTANYL CITRATE (PF) 100 MCG/2ML IJ SOLN
100 | INTRAMUSCULAR | Status: DC | PRN
Start: 2024-09-12 — End: 2024-09-12
  Administered 2024-09-12: 18:00:00 25 via INTRAVENOUS

## 2024-09-12 MED ORDER — IOPAMIDOL 76 % IV SOLN
76 | INTRAVENOUS | Status: DC | PRN
Start: 2024-09-12 — End: 2024-09-12
  Administered 2024-09-12: 18:00:00 140 via INTRA_ARTERIAL

## 2024-09-12 MED ORDER — HEPARIN (PORCINE) IN NACL 25000-0.45 UT/250ML-% IV SOLN
25000-0.45 | INTRAVENOUS | Status: DC
Start: 2024-09-12 — End: 2024-09-13
  Administered 2024-09-13: 11:00:00 500 [IU]/h

## 2024-09-12 MED ORDER — FENTANYL CITRATE (PF) 100 MCG/2ML IJ SOLN
100 | INTRAMUSCULAR | Status: AC
Start: 2024-09-12 — End: 2024-09-12

## 2024-09-12 MED ORDER — ONDANSETRON 4 MG PO TBDP
4 | Freq: Three times a day (TID) | ORAL | Status: DC | PRN
Start: 2024-09-12 — End: 2024-09-13

## 2024-09-12 MED ORDER — IODIXANOL 320 MG/ML IV SOLN
320 | INTRAVENOUS | Status: AC
Start: 2024-09-12 — End: 2024-09-12

## 2024-09-12 MED ORDER — ALTEPLASE 2 MG IJ SOLR
2 | INTRAMUSCULAR | Status: AC
Start: 2024-09-12 — End: 2024-09-13
  Administered 2024-09-13: 07:00:00 0.5 mg/h via INTRAVENOUS

## 2024-09-12 MED ORDER — HEPARIN (PORCINE) IN NACL 25000-0.45 UT/250ML-% IV SOLN
25000-0.45 | INTRAVENOUS | Status: AC
Start: 2024-09-12 — End: 2024-09-12

## 2024-09-12 MED ORDER — ACETAMINOPHEN 325 MG PO TABS
325 | ORAL | Status: DC | PRN
Start: 2024-09-12 — End: 2024-09-13

## 2024-09-12 MED ORDER — SODIUM CHLORIDE 0.9 % IV SOLN
0.9 | INTRAVENOUS | Status: DC | PRN
Start: 2024-09-12 — End: 2024-09-13

## 2024-09-12 MED FILL — ISOVUE-370 76 % IV SOLN: 76 % | INTRAVENOUS | Qty: 100 | Fill #0

## 2024-09-12 MED FILL — INSULIN REGULAR HUMAN 100 UNIT/100ML NS INFUSION: Qty: 100 | Fill #0

## 2024-09-12 MED FILL — SODIUM CHLORIDE 0.9 % IV SOLN: 0.9 % | INTRAVENOUS | Qty: 2000 | Fill #0

## 2024-09-12 MED FILL — SODIUM CHLORIDE 0.9 % IV SOLN: 0.9 % | INTRAVENOUS | Qty: 1000 | Fill #0

## 2024-09-12 MED FILL — HEPARIN (PORCINE) IN NACL 25000-0.45 UT/250ML-% IV SOLN: 25000-0.45 UT/250ML-% | INTRAVENOUS | Qty: 250 | Fill #0

## 2024-09-12 MED FILL — AMLODIPINE BESYLATE 10 MG PO TABS: 10 mg | ORAL | Qty: 1 | Fill #0

## 2024-09-12 MED FILL — SODIUM CHLORIDE 0.45 % IV SOLN: 0.45 % | INTRAVENOUS | Qty: 500 | Fill #0

## 2024-09-12 MED FILL — LIDOCAINE HCL 2 % IJ SOLN: 2 % | INTRAMUSCULAR | Qty: 20 | Fill #0

## 2024-09-12 MED FILL — HUMULIN R 100 UNIT/ML IJ SOLN: 100 [IU]/mL | INTRAMUSCULAR | Qty: 12 | Fill #0

## 2024-09-12 MED FILL — KCL IN DEXTROSE-NACL 20-5-0.45 MEQ/L-%-% IV SOLN: 20-5-0.45 MEQ/L-%-% | INTRAVENOUS | Qty: 1000 | Fill #0

## 2024-09-12 MED FILL — FENTANYL CITRATE (PF) 100 MCG/2ML IJ SOLN: 100 MCG/2ML | INTRAMUSCULAR | Qty: 2 | Fill #0

## 2024-09-12 MED FILL — HEPARIN (PORCINE) IN NACL 1000-0.9 UT/500ML-% IV SOLN: 1000-0.9 UT/500ML-% | INTRAVENOUS | Qty: 1000 | Fill #0

## 2024-09-12 MED FILL — HEPARIN (PORCINE) IN NACL 1000-0.9 UT/500ML-% IV SOLN: 1000-0.9 UT/500ML-% | INTRAVENOUS | Qty: 1500 | Fill #0

## 2024-09-12 MED FILL — IODIXANOL 320 MG/ML IV SOLN: 320 mg/mL | INTRAVENOUS | Qty: 100 | Fill #0

## 2024-09-12 MED FILL — BD POSIFLUSH 0.9 % IV SOLN: 0.9 % | INTRAVENOUS | Qty: 40 | Fill #0

## 2024-09-12 MED FILL — HEPARIN SODIUM (PORCINE) 5000 UNIT/ML IJ SOLN: 5000 [IU]/mL | INTRAMUSCULAR | Qty: 2 | Fill #0

## 2024-09-12 MED FILL — SENNA-LAX 8.6 MG PO TABS: 8.6 mg | ORAL | Qty: 1 | Fill #0

## 2024-09-12 MED FILL — HUMULIN R 100 UNIT/ML IJ SOLN: 100 [IU]/mL | INTRAMUSCULAR | Qty: 6 | Fill #0

## 2024-09-12 MED FILL — HEPARIN SODIUM (PORCINE) 1000 UNIT/ML IJ SOLN: 1000 [IU]/mL | INTRAMUSCULAR | Qty: 10 | Fill #0

## 2024-09-12 MED FILL — ALTEPLASE 2 MG IJ SOLR: 2 mg | INTRAMUSCULAR | Qty: 10 | Fill #0

## 2024-09-12 MED FILL — HEPARIN SODIUM (PORCINE) 5000 UNIT/ML IJ SOLN: 5000 [IU]/mL | INTRAMUSCULAR | Qty: 1 | Fill #0

## 2024-09-12 MED FILL — DILAUDID 1 MG/ML IJ SOLN: 1 mg/mL | INTRAMUSCULAR | Qty: 0.5 | Fill #0

## 2024-09-12 MED FILL — HUMULIN R 100 UNIT/ML IJ SOLN: 100 [IU]/mL | INTRAMUSCULAR | Qty: 100 | Fill #0

## 2024-09-12 MED FILL — MORPHINE SULFATE 4 MG/ML IV SOLN: 4 mg/mL | INTRAVENOUS | Qty: 1 | Fill #0

## 2024-09-12 MED FILL — ISOVUE-370 76 % IV SOLN: 76 % | INTRAVENOUS | Qty: 400 | Fill #0

## 2024-09-12 NOTE — ED Notes (Signed)
"  [id number] paged Medford Barefoot PA taking calls for cardiology.  "

## 2024-09-12 NOTE — Plan of Care (Signed)
"  I did not see or personally evaluate this patient.    Patient information forwarded to me by the admitting provider for possible admission.  Case discussed with ED provider at length.  63 year old gentleman presented with threatened limb with elevated lactic acid of 11.7 and leukocytosis without dopplerable pulses.  Unable to find pulses on ultrasound as per ED provider.  Plan for emergent intervention as per ED provider.    Patient also has significantly elevated blood sugars of 753 with acidosis on BMP.  Anion gap 40.  No VBG or beta-hydroxybutyrate available to evaluate for DKA.  For all intents and purposes this is DKA until proven otherwise.    Discussed with ED provider at length.  No insulin  drip orders noted.  I do not think patient is appropriate for admission to stepdown or floor.  Recommend ICU evaluation.  "

## 2024-09-12 NOTE — ED Notes (Addendum)
"  Lab notified that Provider Nat APRN would like CT as soon as possible, no need to wait for labs per [facility].  "

## 2024-09-12 NOTE — ED Notes (Signed)
"  [id number] message left for Dr Johnice on in pt consult from critical care. Added to treatment team.  "

## 2024-09-12 NOTE — Progress Notes (Addendum)
"  Reported VBG results to Dr Baxter. VBG results are:     Latest Reference Range & Units 09/12/24 10:52   pH, Ven 7.320 - 7.430  7.259 (L)   pCO2, Ven 38 - 54 mm Hg 38.0   pO2, Ven 23 - 48 mm Hg 34.0   Bicarbonate, Venous 22 - 29 mmol/L 16.6 (L)   Negative Base Excess, Ven 0 - 3 mmol/L 9.7 (H)   (L): Data is abnormally low  (H): Data is abnormally high  "

## 2024-09-12 NOTE — Consults (Addendum)
 "CARDIOLOGY CONSULT NOTE   Reason for consultation: Left cold leg    Referring physician:  No admitting provider for patient encounter.     Primary care physician: Fernand Ruffing, MD      Dear  Dr.  Rexford admitting provider for patient encounter.   Thanks for the consult.    Chief Complaints :  Chief Complaint   Patient presents with    Fatigue     Reports has been feeling weak, unable to eat for couple of weeks         History of present illness:Joe Avery is a 63 y.o.year old who presents with sudden onset of left leg going cold and sore earlier today he is previously being admitted for right arm weakness hypertension and diabetes he has history of uncontrolled diabetes.  He says pain is 8 out of 10 he is unable to flex or extend his foot  EKG shows normal sinus rhythm    Past medical history:    has a past medical history of Hypertension and Type 2 diabetes mellitus without complication, without long-term current use of insulin  (HCC).  Past surgical history:   has a past surgical history that includes Colonoscopy (N/A, 02/23/2024).  Social History:   reports that he has been smoking cigarettes. He started smoking about 44 years ago. He has a 21.7 pack-year smoking history. He has never used smokeless tobacco. He reports that he does not currently use drugs. He reports that he does not drink alcohol.  Family history:   no family history of CAD, STROKE of DM at early age    No Known Allergies    heparin  (porcine) injection 2,800 Units, PRN  heparin  (porcine) injection 1,400 Units, PRN  heparin  25,000 unit in sodium chloride  0.45% 250 mL (premix) infusion, Continuous  sodium chloride  0.9 % bolus 2,000 mL, Once      Current Facility-Administered Medications   Medication Dose Route Frequency Provider Last Rate Last Admin    heparin  (porcine) injection 2,800 Units  60 Units/kg IntraVENous PRN Carlena Baston, Melonie, MD        heparin  (porcine) injection 1,400 Units  30 Units/kg IntraVENous PRN Carlena Baston, Melonie, MD         heparin  25,000 unit in sodium chloride  0.45% 250 mL (premix) infusion  5-30 Units/kg/hr IntraVENous Continuous Carlena Baston, Melonie, MD 5.6 mL/hr at 09/12/24 0941 12 Units/kg/hr at 09/12/24 0941    sodium chloride  0.9 % bolus 2,000 mL  2,000 mL IntraVENous Once Carlena Baston, Melonie, MD 2,000 mL/hr at 09/12/24 1025 2,000 mL at 09/12/24 1025     Current Outpatient Medications   Medication Sig Dispense Refill    aspirin  81 MG chewable tablet Take 1 tablet by mouth daily (Patient not taking: Reported on 05/18/2024) 30 tablet 3    atorvastatin  (LIPITOR ) 40 MG tablet Take 1 tablet by mouth nightly 30 tablet 3    metFORMIN (GLUCOPHAGE) 500 MG tablet Take 1 tablet by mouth daily (with breakfast)      raNITIdine HCl (RANITIDINE 150 MAX STRENGTH PO) Take by mouth as needed      Aspirin -Acetaminophen -Caffeine (EXCEDRIN PO) Take by mouth as needed      gabapentin  (NEURONTIN ) 100 MG capsule Take 1 capsule by mouth in the morning and at bedtime. 60 capsule 11    amLODIPine  (NORVASC ) 10 MG tablet Take 1 tablet by mouth daily      lisinopril  (PRINIVIL ;ZESTRIL ) 40 MG tablet Take 1 tablet by mouth daily       Review  of Systems:   Constitutional: No Fever or Weight Loss   Eyes: No Decreased Vision  ENT: No Headaches, Hearing Loss or Vertigo  Cardiovascular: As per HPI  Respiratory: As per HPI  Gastrointestinal: No abdominal pain, appetite loss, blood in stools, constipation, diarrhea or heartburn  Genitourinary: No dysuria, trouble voiding, or hematuria  Musculoskeletal:  No gait disturbance, weakness or joint complaints  Integumentary: No rash or pruritis  Neurological: No TIA or stroke symptoms  Psychiatric: No anxiety or depression  Endocrine: No malaise, fatigue or temperature intolerance  Hematologic/Lymphatic: No bleeding problems, blood clots or swollen lymph nodes  Allergic/Immunologic: No nasal congestion or hives  All systems negative except as marked.        Physical Examination:    Vitals:    09/12/24 0819 09/12/24  0822 09/12/24 0838 09/12/24 0930   BP:  (!) 137/108  (!) 88/72   Pulse:   (!) 103    Resp:    18   Temp:  97.6 F (36.4 C) 96.9 F (36.1 C)    TempSrc:  Oral Axillary    SpO2:   100% 95%   Weight:  46.3 kg (102 lb)     Height: 1.753 m (5' 9)          General Appearance:  No distress, conversant    Constitutional:  Well developed, Well nourished, No acute distress, Non-toxic appearance.   HENT:  Normocephalic, Atraumatic, Bilateral external ears normal, Oropharynx moist, No oral exudates, Nose normal. Neck- Normal range of motion, No tenderness, Supple, No stridor,no apical-carotid delay  Lymphatics : no palpable lymph nodes  Eyes:  PERRL, EOMI, Conjunctiva normal, No discharge.   Respiratory:  Normal breath sounds, No respiratory distress, No wheezing, No chest tenderness.,no use of accessory muscles, crackles Absent   Cardiovascular: (PMI) apex non displaced,no lifts no thrills, ankle swelling Absent  , 1+, s1 and s2 audible,Murmur.Absent , JVD not noted    Abdomen /GI:  Bowel sounds normal, Soft, No tenderness, No masses, No gross visceromegaly   GU:  No costovertebral angle tenderness   Musculoskeletal: Left foot is cold with no capillary reperfusion dorsalis pedis is nonpalpable patient unable to extend or flex his ankle  Integument:  Well hydrated, no rash   Lymphatic:  No lymphadenopathy noted   Neurologic:  Alert & oriented x 3, CN 2-12 normal, normal motor function, normal sensory function, no focal deficits noted           Medical decision making and Data review:    Lab Review   Recent Labs     09/12/24  0900   WBC 14.6*   HGB 15.5   HCT 46.6   PLT 276      Recent Labs     09/12/24  0900   NA 136   K 3.5   CL 84*   CO2 12*   BUN 56*   CREATININE 2.4*     Recent Labs     09/12/24  0900   AST 20   ALT 14   BILITOT 0.5   ALKPHOS 135*     No results for input(s): TROPONINT in the last 72 hours.    No results for input(s): PROBNP in the last 72 hours.  Lab Results   Component Value Date    INR 1.2  09/12/2024    PROTIME 15.5 (H) 09/12/2024       EKG: (reviewed and interpreted by myself)    ECHO:(reviewed and interpreted by myself)  Chest Xray:(reviewed and interpreted by myself)  XR CHEST PORTABLE  Result Date: 09/12/2024  PROCEDURE: XR CHEST PORTABLE DATE OF EXAM:  09/12/2024 9:53 DEMOGRAPHICS: 63 years old Male INDICATION: Altered Mental Status COMPARISON: None FINDINGS:  Hyperinflation. No pleural effusion, pneumothorax, or focal consolidation. Cardiac size is within normal limits. No acute osseous abnormality. IMPRESSION:  Hyperinflation without acute airspace disease  Dictated and Electronically Signed By: Jorene Plough, MD [facility] 09/12/2024 9:24          All labs, medications and tests reviewed by myself including data  from outside source , patient and available family .  Continue all other medications of all above medical condition listed as is.     Impression:  Active Problems:    Acute lower limb ischemia  Resolved Problems:    * No resolved hospital problems. *      Assessment: 63 y.o.year old with PMH of  has a past medical history of Hypertension and Type 2 diabetes mellitus without complication, without long-term current use of insulin  (HCC).      Plan and Recommendations:    Acute cold leg proceed with angiogram possible intervention start heparin  ASAP  Patient high risk for losing left leg will need urgent intervention  Get echo     Diabetes uncontrolled will need ICU management  Screen for A-fib  6.   Dyslipidemia: continue statins   Discussed with primary team, hospitalist service ,bedside staff ,nursing staff, patient and family  Patient is critical and sick risk of losing his left leg Cath Lab updated discussed with ED staff      Thank you  much for consult and giving us  the opportunity in contributing in the care of this patient. Please feel free to call me for any questions.       Paulita Cosette Noble, MD, 09/12/2024 10:32 AM     The above note is prepared with the  intention to serve as communication with trained medical care practitioners only. Some of the information is provided by secondary sources as well as from our patient and/or family member(s) recollection, thus inaccuracies may occur. In addition, other professionals integrate data into the electronic medical record, and the [facility] Team MD and NPs are not responsible for these. Any errors will be corrected upon verification with documented reports.   "

## 2024-09-12 NOTE — H&P (Signed)
 "   V2.0  History and Physical      Name:  Joe Avery DOB/Age/Sex: 1961/11/04  (63 y.o. male)   MRN & CSN:  [id number] & [id number] Encounter Date/Time: 09/12/2024 10:39 AM EST   Location:  [facility] PCP: Fernand Ruffing, MD       Hospital Day: 1    Assessment and Plan:   Joe Avery is a 63 y.o. male with a pmh of DM, CKD stg III, HTN, PVD, COPD, with recent colonoscopy Dr. Madie 02/23/2024 who presents with severe leg left pain, cold left leg.     Hospital Problems           Last Modified POA    * (Principal) Acute lower limb ischemia 09/12/2024 Yes    Critical limb ischemia of left lower extremity (HCC) 09/12/2024 Yes     Problem list  Left leg ischemia   PVD  Lactic acidosis   DKA  DM II, poorly controlled   AKI on CKD III  HTN    Neuro: awake and alert, left leg cool and pale without sensation, significant pain to LLE. Neurovascular checks as below, neuro checks per protocol, multimodal pain management in place.     Cardio:  Admitted with cold LLE, absent left pedal pulses, heparin  infusion started in ED. Cardiology consulted, plan to take to cath lab for revascularization, per nursing report complete left SFA occlusion, await formal procedure report. Left sheath remains in place, infusing with heparin  and TPA on admit to ICU, continue per cardiology recs, plan to return to cath lab tomorrow to reevaluate. Neurovascular checks per protocol. Echo ordered and pending. Antihypertensives available for goal SBP <160 .     Resp: No acute concerns, tolerating RA, goal SpO2 >92%. Encourage good pulm hygiene and IS use.     GI: No acute concerns, GI prophy in place, bowel regimen. NPO pending procedures and DKA protocol as below.      GU: Creat 1.8, BUN 53, baseline creat ~1.2. Monitor strict I&Os. IVF per DKI protocol as below. Trend renal function frequently.     Heme:  Hgb 13.6, plt 227. Heparin  gtt and TPA as above. Monitor for s/sx outward bleeding, currently none. Transfuse for goal Hgb >7.     ID: WBC 14.6 on admit,  afebrile, lactic 11.7, suspect reactive in setting of limb ischemia. No obvious s/sx infection. Monitor off antibiotics currently. Trend CBC and fever curve daily. Trend lactic q6 hrs.     Endo: BG on admit 753, anion gap 40. A1c on admit 11.3, reported poor compliance with home insulin  regimen. Lactic 11.7 on admit. Initiate DKA protocol insulin  infusion and IVF resuscitation. Q1 hr accuchecks per protocol, q4 hr BMP and BHB. Trend lactic q6 hrs. Endocrinology consulted, appreciate assistance. Maintain NPO.     MSK: Neurovascular and neuro checks per protocol, PT/OT as able, pain regimen in place, bedrest given sheath in place for now.     Tubes/Lines/Drains:  Left femoral sheath, PIVs    Code Status: Full         Disposition:   Current Living situation: home  Expected Disposition: home  Estimated D/C: tbd    Diet Diet NPO   DVT Prophylaxis []  Lovenox , [x]   Heparin , []  SCDs, []  Ambulation,  []  Eliquis, []  Xarelto   Code Status Full Code   Surrogate Decision Maker/ POA Norleen Pouch, parent      History from:     patient, electronic medical record    History of Present Illness:  Chief Complaint: Acute lower limb ischemia  Joe Avery is a 63 y.o. male with pmh of DM, CKD stg III, HTN, PVD, COPD, with recent colonoscopy Dr. Madie 02/23/2024 who presents with severe leg left pain, cold left leg. Patient states he has not been eating or drinking well for past couple days, woke up this AM to severe left leg and thigh pain and coolness. In ED, patient without doppler pedal pulses, no cap refill, decreased sensation. Cardiology consulted, planning for CTA of limb with anticipated procedure pending imaging findings. Patient labs also significant for lactic 11.6, blood glucose 753, anion gap 40, CO2 12, creat 2.4, WBC 14.6. Patient admitted to ICU post procedure for further workup and treatment planning.     Upon arrival to ICU, patient resting in bed on RA, left leg cool to touch and pale, absent pulses, significant  pain. Denies CP, SOB, HA, N/V/D. Reports normal sensation and no numbness to right leg, reports left leg pain, numbness and no sensation to left leg. Asking for ice chips, no further complaints.       Review of Systems: Need 10 Elements   Review of Systems   Constitutional:  Negative for chills and fever.   HENT: Negative.     Eyes: Negative.    Respiratory:  Negative for chest tightness, shortness of breath and wheezing.    Cardiovascular:  Negative for chest pain.   Gastrointestinal:  Negative for abdominal pain, diarrhea, nausea and vomiting.   Endocrine: Negative.    Genitourinary: Negative.    Musculoskeletal:         Left leg pain   Skin: Negative.    Neurological:         Left leg absent sensation, reported numbness   Psychiatric/Behavioral: Negative.            Objective:   No intake or output data in the 24 hours ending 09/12/24 1125   Vitals:   Vitals:    09/12/24 0819 09/12/24 0822 09/12/24 0838 09/12/24 0930   BP:  (!) 137/108  (!) 88/72   Pulse:   (!) 103    Resp:    18   Temp:  97.6 F (36.4 C) 96.9 F (36.1 C)    TempSrc:  Oral Axillary    SpO2:   100% 95%   Weight:  46.3 kg (102 lb)     Height: 1.753 m (5' 9)          Medications Prior to Admission     Prior to Admission medications   Medication Sig Start Date End Date Taking? Authorizing Provider   aspirin  81 MG chewable tablet Take 1 tablet by mouth daily  Patient not taking: Reported on 05/18/2024 12/14/23   D'Entremont, Mabel DASEN, MD   atorvastatin  (LIPITOR ) 40 MG tablet Take 1 tablet by mouth nightly 12/13/23   D'Entremont, Mabel DASEN, MD   metFORMIN (GLUCOPHAGE) 500 MG tablet Take 1 tablet by mouth daily (with breakfast) 08/12/23   [provider]   raNITIdine HCl (RANITIDINE 150 MAX STRENGTH PO) Take by mouth as needed    [provider]   Aspirin -Acetaminophen -Caffeine (EXCEDRIN PO) Take by mouth as needed    [provider]   gabapentin  (NEURONTIN ) 100 MG capsule Take 1 capsule by mouth in the morning and at bedtime.  08/06/23 08/05/24  Fernande Alfonso Sahara, APRN - CNP   amLODIPine  (NORVASC ) 10 MG tablet Take 1 tablet by mouth daily 02/02/23 05/18/24  [provider]   lisinopril  (PRINIVIL ;ZESTRIL )  40 MG tablet Take 1 tablet by mouth daily 02/02/23 05/18/24  [provider]       Physical Exam: Need 8 Elements   Physical Exam     General: Moderate distress, awake and aler   Eyes: EOMI  ENT: neck supple  Cardiovascular: Regular rate.  Respiratory: Clear to auscultation  Gastrointestinal: Soft, non tender  Genitourinary: no suprapubic tenderness  Musculoskeletal: No edema  Skin: dry, LLE cool and pale to touch, absent pulses  Neuro: Alert and oriented, LLE pale and absent pulses, cool to touch, able to move BUE and RLE to command  Psych: Mood appropriate, calm       Past Medical History:   PMHx   Past Medical History:   Diagnosis Date    Hypertension     Type 2 diabetes mellitus without complication, without long-term current use of insulin  (HCC) 08/11/2023     PSHX:  has a past surgical history that includes Colonoscopy (N/A, 02/23/2024).  Allergies: No Known Allergies  Fam HX:  family history includes Diabetes in his father.  Soc HX:   Social History     Socioeconomic History    Marital status: Single   Tobacco Use    Smoking status: Every Day     Current packs/day: 0.10     Average packs/day: 0.5 packs/day for 44.8 years (21.7 ttl pk-yrs)     Types: Cigarettes     Start date: 23    Smokeless tobacco: Never    Tobacco comments:     Down to 3 per day   Vaping Use    Vaping status: Never Used   Substance and Sexual Activity    Alcohol use: Never    Drug use: Not Currently     Social Drivers of Health     Food Insecurity: No Food Insecurity (12/12/2023)    Hunger Vital Sign     Worried About Running Out of Food in the Last Year: Never true     Ran Out of Food in the Last Year: Never true   Transportation Needs: No Transportation Needs (12/12/2023)    PRAPARE - Therapist, Art (Medical): No     Lack  of Transportation (Non-Medical): No   Housing Stability: Low Risk  (12/12/2023)    Housing Stability Vital Sign     Unable to Pay for Housing in the Last Year: No     Number of Times Moved in the Last Year: 0     Homeless in the Last Year: No       Medications:   Medications:    sodium chloride  flush  5-40 mL IntraVENous 2 times per day    insulin  regular  1-20 Units IntraVENous Once    aspirin   81 mg Oral Daily    atorvastatin   40 mg Oral Nightly      Infusions:    heparin  (PORCINE) Infusion 12 Units/kg/hr (09/12/24 0941)    sodium chloride       dextrose  5% and 0.45% NaCl with KCl 20 mEq      sodium chloride       insulin        PRN Meds: heparin  (porcine), 60 Units/kg, PRN  heparin  (porcine), 30 Units/kg, PRN  sodium chloride  flush, 5-40 mL, PRN  sodium chloride , , PRN  ondansetron , 4 mg, Q8H PRN   Or  ondansetron , 4 mg, Q6H PRN  polyethylene glycol, 17 g, Daily PRN  acetaminophen , 650 mg, Q6H PRN   Or  acetaminophen ,  650 mg, Q6H PRN  dextrose  bolus, 125 mL, PRN   Or  dextrose  bolus, 250 mL, PRN  potassium chloride , 10 mEq, PRN  magnesium  sulfate, 2,000 mg, PRN  sodium phosphate  15 mmol in sodium chloride  0.9 % 250 mL IVPB, 15 mmol, PRN  dextrose  5% and 0.45% NaCl with KCl 20 mEq, , Continuous PRN  senna, 1 tablet, Daily PRN        Labs      CBC:   Recent Labs     09/12/24  0900   WBC 14.6*   HGB 15.5   PLT 276     BMP:    Recent Labs     09/12/24  0900   NA 136   K 3.5   CL 84*   CO2 12*   BUN 56*   CREATININE 2.4*   GLUCOSE 753*     Hepatic:   Recent Labs     09/12/24  0900   AST 20   ALT 14   BILITOT 0.5   ALKPHOS 135*     Lipids:   Lab Results   Component Value Date/Time    CHOL 127 12/13/2023 05:01 AM    HDL 38 12/13/2023 05:01 AM    TRIG 86 12/13/2023 05:01 AM     Hemoglobin A1C:   Lab Results   Component Value Date/Time    LABA1C 7.3 01/04/2024 10:05 AM     TSH:   Lab Results   Component Value Date/Time    TSH 0.46 09/12/2024 09:00 AM     Troponin: No results found for: TROPONINT  Lactic Acid:   Recent Labs      09/12/24  0900   LACTA 11.7*     BNP: No results for input(s): PROBNP in the last 72 hours.  UA:  Lab Results   Component Value Date/Time    NITRU Negative 05/05/2024 10:29 AM    COLORU Yellow 05/05/2024 10:29 AM    PHUR 6.0 05/05/2024 10:29 AM    LABCAST None seen 01/04/2024 10:05 AM    RBCUA 3-10 01/04/2024 10:05 AM    BACTERIA None seen 01/04/2024 10:05 AM    CLARITYU Clear 05/05/2024 10:29 AM    LEUKOCYTESUR Negative 05/05/2024 10:29 AM    UROBILINOGEN 0.2 05/05/2024 10:29 AM    BILIRUBINUR Negative 05/05/2024 10:29 AM    BLOODU Negative 05/05/2024 10:29 AM    GLUCOSEU Negative 05/05/2024 10:29 AM    KETUA Negative 05/05/2024 10:29 AM     Urine Cultures: No results found for: LABURIN  Blood Cultures: No results found for: BC  No results found for: BLOODCULT2  Organism: No results found for: ORG    Imaging/Diagnostics Last 24 Hours   XR CHEST PORTABLE  Result Date: 09/12/2024  PROCEDURE: XR CHEST PORTABLE DATE OF EXAM:  09/12/2024 9:53 DEMOGRAPHICS: 63 years old Male INDICATION: Altered Mental Status COMPARISON: None FINDINGS:  Hyperinflation. No pleural effusion, pneumothorax, or focal consolidation. Cardiac size is within normal limits. No acute osseous abnormality. IMPRESSION:  Hyperinflation without acute airspace disease  Dictated and Electronically Signed By: Jorene Plough, MD [facility] 09/12/2024 9:24          Personally reviewed Lab Studies, Imaging, and discussed case with Dr. Argyle.     Electronically signed by Broderick Pinal, APRN - CNP on 09/12/2024 at 11:25 AM            "

## 2024-09-12 NOTE — ED Notes (Signed)
 "ED TO INPATIENT SBAR HANDOFF    Patient Name: Joe Avery   DOB:  07-25-1961  63 y.o.   Preferred Name  Joe Avery  Family/Caregiver Present no   Restraints no   C-SSRS: Risk of Suicide: No Risk  Sitter no   Sepsis Risk Score Sepsis V2 Risk Score: 39.8    PLEASE NOTE--Encounter / Re-Admission Within 30 Days  This patient has had another encounter or admission within the last 30 days.      Readmission Risk Score: 12.1      Situation  Chief Complaint   Patient presents with    Fatigue     Reports has been feeling weak, unable to eat for couple of weeks      Brief Description of Patient's Condition: Patient resting in ED cart attempting to find a comfortable position. Joe Avery is a 62 y.o. male history of right arm weakness HTN stage IIIa CKD recent colonoscopy Dr. Madie 02/23/2024 from who presents to ED stating this morning when he woke up he had severe left upper thigh pain.  Neighbor at bedside stated when he found him he was sitting at his table having significant pain and asked him if he wanted go to the emergency department he said yes.  Stated he has not been eating or drinking well the last couple days.  Denies any chest pain or shortness of breath.  Denied any fever.  Denied any loss of sensation just states it feels cold and is painful.  Denies any trauma or falls to the area.  Mental Status: oriented and alert  Arrived from: home    Imaging:   XR CHEST PORTABLE   Final Result        Abnormal labs:   Abnormal Labs Reviewed   CBC WITH AUTO DIFFERENTIAL - Abnormal; Notable for the following components:       Result Value    WBC 14.6 (*)     MPV 12.7 (*)     Neutrophils % 89 (*)     Lymphocytes % 7 (*)     Immature Granulocytes % 1 (*)     All other components within normal limits   LACTIC ACID - Abnormal; Notable for the following components:    Lactic Acid 11.7 (*)     All other components within normal limits   PROTIME-INR - Abnormal; Notable for the following components:    Protime 15.5 (*)     All other  components within normal limits   APTT - Abnormal; Notable for the following components:    APTT 21.8 (*)     All other components within normal limits        Background  History:   Past Medical History:   Diagnosis Date    Hypertension     Type 2 diabetes mellitus without complication, without long-term current use of insulin  (HCC) 08/11/2023       Assessment    Vitals:        Vitals:    09/12/24 0819 09/12/24 0822 09/12/24 0838 09/12/24 0930   BP:  (!) 137/108  (!) 88/72   Pulse:   (!) 103    Resp:    18   Temp:  97.6 F (36.4 C) 96.9 F (36.1 C)    TempSrc:  Oral Axillary    SpO2:   100% 95%   Weight:  46.3 kg (102 lb)     Height: 1.753 m (5' 9)  PO Status: Nothing by Mouth    O2 Flow Rate:        Cardiac Rhythm: Normal sinus rhythm   Biatrial enlargement   Right axis deviation   Pulmonary disease pattern   Incomplete right bundle branch block   Prolonged QT   Abnormal ECG   When compared with ECG of 12-Dec-2023 12:44,   QT has lengthened     Last documented pain medication administered: 4mg  morphine  @ 0901h    NIH Score: NIH       Active LDA's:   Peripheral IV 09/12/24 Right Antecubital (Active)   Site Assessment Clean, dry & intact 09/12/24 0900   Line Status Blood return noted 09/12/24 0900   Phlebitis Assessment No symptoms 09/12/24 0900   Infiltration Assessment 0 09/12/24 0900   Dressing Status New dressing applied 09/12/24 0900   Dressing Type Transparent 09/12/24 0900   Dressing Intervention New 09/12/24 0900       Peripheral IV 09/12/24 Left Antecubital (Active)       Pertinent or High Risk Medications/Drips: yes   If Yes, please provide details: Heparin  @ 12units/kg/hr      Blood Product Administration: no  If Yes, please provide details:       Recommendation    Incomplete orders N/a  Additional Comments: N/a   If any further questions, please call Sending RN at [phone]    Electronically signed by: Electronically signed by Allean Hutch, RN on 09/12/2024 at 9:55 AM   "

## 2024-09-12 NOTE — ED Provider Notes (Signed)
 "      [facility] ICU  EMERGENCY DEPARTMENT ENCOUNTER        Pt Name: Joe Avery  MRN: [id number]  Birthdate 02-20-1961  Date of evaluation: 09/12/2024  Provider: Nat DELENA Angelucci, APRN - CNP  PCP: Fernand Ruffing, MD  Note Started: 8:35 AM EST 09/12/24       I have seen and evaluated this patient with my supervising physician [facility], Jackalyn*.      CHIEF COMPLAINT       Chief Complaint   Patient presents with    Fatigue     Reports has been feeling weak, unable to eat for couple of weeks        HISTORY OF PRESENT ILLNESS: 1 or more Elements     History From: Patient and neighbor    Limitations to history : None    Social Determinants Significantly Affecting Health : None    Chief Complaint: Severe left thigh pain    Joe Avery is a 63 y.o. male history of right arm weakness HTN stage IIIa CKD recent colonoscopy Dr. Madie 02/23/2024 from who presents to ED stating this morning when he woke up he had severe left upper thigh pain.  Neighbor at bedside stated when he found him he was sitting at his table having significant pain and asked him if he wanted go to the emergency department he said yes.  Stated he has not been eating or drinking well the last couple days.  Denies any chest pain or shortness of breath.  Denied any fever.  Denied any loss of sensation just states it feels cold and is painful.  Denies any trauma or falls to the area.    Nursing Notes were all reviewed and agreed with or any disagreements were addressed in the HPI.    REVIEW OF SYSTEMS :      Review of Systems    Positives and Pertinent negatives as per HPI.     SURGICAL HISTORY     Past Surgical History:   Procedure Laterality Date    COLONOSCOPY N/A 02/23/2024    COLONOSCOPY POLYPECTOMY SNARE/BIOPSY performed by Madie Dine, MD at [facility] ENDOSCOPY       CURRENTMEDICATIONS       Current Discharge Medication List        CONTINUE these medications which have NOT CHANGED    Details   aspirin  81 MG chewable tablet Take 1 tablet by mouth daily  Qty: 30  tablet, Refills: 3      atorvastatin  (LIPITOR ) 40 MG tablet Take 1 tablet by mouth nightly  Qty: 30 tablet, Refills: 3      metFORMIN (GLUCOPHAGE) 500 MG tablet Take 1 tablet by mouth daily (with breakfast)      raNITIdine HCl (RANITIDINE 150 MAX STRENGTH PO) Take by mouth as needed      Aspirin -Acetaminophen -Caffeine (EXCEDRIN PO) Take by mouth as needed      gabapentin  (NEURONTIN ) 100 MG capsule Take 1 capsule by mouth in the morning and at bedtime.  Qty: 60 capsule, Refills: 11    Associated Diagnoses: Idiopathic peripheral neuropathy      amLODIPine  (NORVASC ) 10 MG tablet Take 1 tablet by mouth daily      lisinopril  (PRINIVIL ;ZESTRIL ) 40 MG tablet Take 1 tablet by mouth daily             ALLERGIES     Patient has no known allergies.    FAMILYHISTORY       Family History  Problem Relation Age of Onset    Diabetes Father         SOCIAL HISTORY       Social History     Tobacco Use    Smoking status: Every Day     Current packs/day: 0.10     Average packs/day: 0.5 packs/day for 44.8 years (21.7 ttl pk-yrs)     Types: Cigarettes     Start date: 67    Smokeless tobacco: Never    Tobacco comments:     Down to 3 per day   Vaping Use    Vaping status: Never Used   Substance Use Topics    Alcohol use: Never    Drug use: Not Currently       SCREENINGS     NIHSS:     GCS: Glasgow Coma Scale  Eye Opening: Spontaneous  Best Verbal Response: Oriented  Best Motor Response: Obeys commands  Glasgow Coma Scale Score: 15    Heart Score      PECARN Last:       CIWA: CIWA Assessment  BP: (!) 134/92  Pulse: (!) 108  COW Score: No data recorded   CURB 65 Last:     PORT Score:     WELL Criteria:     PERC Score:     CURB 65:      PHYSICAL EXAM  1 or more Elements     ED Triage Vitals   BP Girls Systolic BP Percentile Girls Diastolic BP Percentile Boys Systolic BP Percentile Boys Diastolic BP Percentile Temp Temp Source Pulse   09/12/24 0822 -- -- -- -- 09/12/24 0822 09/12/24 0822 --   (!) 137/108     97.6 F (36.4 C) Oral       Resp  SpO2 Height Weight - Scale       -- -- 09/12/24 0819 09/12/24 0822         1.753 m (5' 9) 46.3 kg (102 lb)           Physical Exam    Patient in significant discomfort.   Able to talk in complete sentences.  Able to ambulate without any difficulty.patient is mildly tachycardic in significant pain as well as hypertensive.  Patient is afebrile.  Cardiac exam without murmur RR.  Left lower leg unable to obtain pulses is cool to touch is not mottled but is pale no cap refill noted.  No lower extremity edema noted.  Lung exam clear bilaterally without adventitious sounds noted.  Abdominal exam without tenderness masses without CVA tenderness or suprapubic tenderness noted.    DIAGNOSTIC RESULTS   LABS:    Labs Reviewed   CBC WITH AUTO DIFFERENTIAL - Abnormal; Notable for the following components:       Result Value    WBC 14.6 (*)     MPV 12.7 (*)     Neutrophils % 89 (*)     Lymphocytes % 7 (*)     Immature Granulocytes % 1 (*)     All other components within normal limits   COMPREHENSIVE METABOLIC PANEL - Abnormal; Notable for the following components:    Chloride 84 (*)     CO2 12 (*)     Anion Gap 40 (*)     Glucose 753 (*)     BUN 56 (*)     Creatinine 2.4 (*)     Est, Glom Filt Rate 28 (*)     Alkaline Phosphatase 135 (*)  All other components within normal limits   LACTIC ACID - Abnormal; Notable for the following components:    Lactic Acid 11.7 (*)     All other components within normal limits   LACTIC ACID - Abnormal; Notable for the following components:    Lactic Acid 3.7 (*)     All other components within normal limits   MAGNESIUM  - Abnormal; Notable for the following components:    Magnesium  3.2 (*)     All other components within normal limits   PROTIME-INR - Abnormal; Notable for the following components:    Protime 15.5 (*)     All other components within normal limits   APTT - Abnormal; Notable for the following components:    APTT 21.8 (*)     All other components within normal limits   ANTI-XA,  HEPARIN  - Abnormal; Notable for the following components:    Anti-XA Unfrac Heparin  >1.10 (*)     All other components within normal limits   BETA-HYDROXYBUTYRATE - Abnormal; Notable for the following components:    Beta-Hydroxybutyrate 6.48 (*)     All other components within normal limits   BLOOD GAS, VENOUS - Abnormal; Notable for the following components:    pH, Ven 7.259 (*)     HCO3, Venous 16.6 (*)     Negative Base Excess, Ven 9.7 (*)     Methemoglobin 0.4 (*)     All other components within normal limits   BASIC METABOLIC PANEL - Abnormal; Notable for the following components:    CO2 17 (*)     Anion Gap 26 (*)     Glucose 501 (*)     BUN 53 (*)     Creatinine 1.8 (*)     Est, Glom Filt Rate 39 (*)     All other components within normal limits   MAGNESIUM  - Abnormal; Notable for the following components:    Magnesium  2.8 (*)     All other components within normal limits   HEMOGLOBIN A1C - Abnormal; Notable for the following components:    Hemoglobin A1C 11.7 (*)     All other components within normal limits   CBC - Abnormal; Notable for the following components:    WBC 17.7 (*)     Hematocrit 38.8 (*)     MPV 12.5 (*)     All other components within normal limits   BASIC METABOLIC PANEL - Abnormal; Notable for the following components:    CO2 20 (*)     Anion Gap 22 (*)     Glucose 522 (*)     BUN 53 (*)     Creatinine 1.8 (*)     Est, Glom Filt Rate 38 (*)     All other components within normal limits   BETA-HYDROXYBUTYRATE - Abnormal; Notable for the following components:    Beta-Hydroxybutyrate 5.74 (*)     All other components within normal limits   ANTI-XA, HEPARIN  - Abnormal; Notable for the following components:    Anti-XA Unfrac Heparin  >1.10 (*)     All other components within normal limits   CBC - Abnormal; Notable for the following components:    WBC 17.0 (*)     Hematocrit 39.9 (*)     MPV 12.6 (*)     All other components within normal limits   APTT - Abnormal; Notable for the following  components:    APTT >240.0 (*)     All other components within normal limits  POCT GLUCOSE - Abnormal; Notable for the following components:    POC Glucose 477 (*)     All other components within normal limits   POCT GLUCOSE - Abnormal; Notable for the following components:    POC Glucose 464 (*)     All other components within normal limits   POCT GLUCOSE - Abnormal; Notable for the following components:    POC Glucose 347 (*)     All other components within normal limits   POCT GLUCOSE - Abnormal; Notable for the following components:    POC Glucose 296 (*)     All other components within normal limits   TSH   ANTI-XA, HEPARIN    VENOUS BLOOD GAS LAB COLLECT   PHOSPHORUS   CK   FIBRINOGEN   FIBRINOGEN   ANTI-XA, HEPARIN    BETA-HYDROXYBUTYRATE   BETA-HYDROXYBUTYRATE   BASIC METABOLIC PANEL   MAGNESIUM    PHOSPHORUS   LACTIC ACID   BASIC METABOLIC PANEL   MAGNESIUM    PHOSPHORUS   LACTIC ACID   CBC   APTT   FIBRINOGEN   BETA-HYDROXYBUTYRATE   CBC   APTT   FIBRINOGEN   ANTI-XA, HEPARIN    BASIC METABOLIC PANEL   MAGNESIUM    PHOSPHORUS   BETA-HYDROXYBUTYRATE   CK   LACTIC ACID   BASIC METABOLIC PANEL   POCT GLUCOSE       When ordered only abnormal lab results are displayed. All other labs were within normal range or not returned as of this dictation.    EKG: When ordered, EKG's are interpreted by the Emergency Department Physician in the absence of a cardiologist.  Please see their note for interpretation of EKG.    RADIOLOGY:   Non-plain film images such as CT, Ultrasound and MRI are read by the radiologist.     Interpretation per the Radiologist below, if available at the time of this note:    XR CHEST PORTABLE   Final Result      Vascular duplex lower extremity arteries left    (Results Pending)     No results found.      ED Provider US  Interpretation.    No results found.          CRITICAL CARE TIME (.cctime)   There was a high probability of life-threatening clinical deterioration in the patient's condition  requiring my urgent intervention.  I personally saw the patient and independently provided 25 minutes of non-concurrent critical care out of the total shared critical care time provided, excluding separately reportable procedures.        See interventions in ED course.     EMERGENCY DEPARTMENT COURSE and DIFFERENTIAL DIAGNOSIS/MDM:   Vitals:    Vitals:    09/12/24 1600 09/12/24 1700 09/12/24 1730 09/12/24 1823   BP: (!) 124/105 113/86 (!) 134/92    Pulse: (!) 112 (!) 101 (!) 111 (!) 108   Resp:    19   Temp:       TempSrc:       SpO2:    98%   Weight:       Height:           Is this patient to be included in the SEP-1 Core Measure due to severe sepsis or septic shock?   No   Exclusion criteria - the patient is NOT to be included for SEP-1 Core Measure due to:  Infection is not suspected    Patient was given the following medications:  Medications   heparin  (  porcine) injection 2,800 Units (has no administration in time range)   heparin  (porcine) injection 1,400 Units (has no administration in time range)   heparin  25,000 unit in sodium chloride  0.45% 250 mL (premix) infusion (0 Units/kg/hr  46.3 kg IntraVENous Stopped 09/12/24 1413)   sodium chloride  flush 0.9 % injection 5-40 mL ( IntraVENous Not Given 09/12/24 1548)   sodium chloride  flush 0.9 % injection 5-40 mL (has no administration in time range)   0.9 % sodium chloride  infusion (has no administration in time range)   polyethylene glycol (GLYCOLAX ) packet 17 g (has no administration in time range)   acetaminophen  (TYLENOL ) suppository 650 mg (has no administration in time range)   dextrose  bolus 10% 125 mL (has no administration in time range)     Or   dextrose  bolus 10% 250 mL (has no administration in time range)   potassium chloride  10 mEq/100 mL IVPB (Peripheral Line) (has no administration in time range)   magnesium  sulfate 2000 mg in 50 mL IVPB premix (has no administration in time range)   sodium phosphate  15 mmol in sodium chloride  0.9 % 250 mL IVPB (has  no administration in time range)   dextrose  5 % and 0.45 % NaCl with KCl 20 mEq infusion (has no administration in time range)   0.45 % sodium chloride  infusion ( IntraVENous New Bag 09/12/24 1529)   senna (SENOKOT) tablet 8.6 mg (has no administration in time range)   insulin  regular (HumuLIN  R;NovoLIN  R) injection 1-20 Units (12 Units IntraVENous Given 09/12/24 1533)     And   insulin  (HumuLIN  R) 100 units in sodium chloride  0.9% 100 mL infusion (4.7 Units/hr IntraVENous Rate/Dose Change 09/12/24 1810)   atorvastatin  (LIPITOR ) tablet 40 mg (has no administration in time range)   acetaminophen  (TYLENOL ) tablet 650 mg (has no administration in time range)   aspirin  chewable tablet 81 mg (has no administration in time range)   ondansetron  (ZOFRAN -ODT) disintegrating tablet 4 mg (has no administration in time range)     Or   ondansetron  (ZOFRAN ) injection 4 mg (has no administration in time range)   0.9 % sodium chloride  infusion (has no administration in time range)   heparin  25,000 unit in sodium chloride  0.45% 250 mL (premix) infusion (500 Units/hr IntraCATHeter Associate Infusion Device 09/12/24 1517)   HYDROmorphone  (DILAUDID ) injection 0.5 mg (0.5 mg IntraVENous Given 09/12/24 1430)   amLODIPine  (NORVASC ) tablet 10 mg (has no administration in time range)   gabapentin  (NEURONTIN ) capsule 100 mg (has no administration in time range)   hydrALAZINE  (APRESOLINE ) injection 10 mg (has no administration in time range)   ALTEplase  (CATHFLO) 10 mg in sodium chloride  0.9 % 100 mL infusion (has no administration in time range)   morphine  sulfate (PF) injection 4 mg (4 mg IntraVENous Given 09/12/24 0901)   heparin  (porcine) injection 2,800 Units (2,800 Units IntraVENous Given 09/12/24 0942)   insulin  regular (HumuLIN  R;NovoLIN  R) injection 6 Units (6 Units IntraVENous Given 09/12/24 1020)   sodium chloride  0.9 % bolus 2,000 mL (0 mLs IntraVENous Stopped 09/12/24 1354)   heparin  25,000 unit in sodium chloride  0.45% 250 mL (premix)  infusion (500 Units/hr Intra-arTERial New Bag 09/12/24 1334)       ED Course as of 09/12/24 1831   Mon Sep 12, 2024   0856 I was engaged in this patient's care because he presented for left lower extremity discomfort, and he is leg seems to be pulseless.  I assessed him.  He is extremely uncomfortable from  pain, with paresthesia, a cold extremity, no pulses detectable with palpation, Doppler, or pulsatile Doppler with direct ultrasound visualization.  Symptoms are extremely concerning for acute limb ischemia. [SC]   [id number] Consulted interventional cardiology and radiology. [SC]   9095 Discussed the case with interventional radiology.  They mentioned that interventional cardiology takes care of this problem and this hospital.  We are still waiting for their call.  Initiated heparin . [SC]   [id number] Perfect [NS]   [id number] Discussed the case with cardiology, they are planning on taking the patient for an angiography, diagnostic and therapeutic.  They are going to take him in the upcoming minutes.  As such, cancelled CT angiography. [SC]   0917 Patient will be admitted to medicine for further management. [SC]   0920 EKG with a rate of 99, normal sinus rhythm, incomplete right bundle branch block, no prolonged QRS, mildly peaked T waves, mildly prolonged QT, no ST segment or T wave abnormalities to suggest acute ischemia. [SC]   1028 CMP(!!):    Sodium 136   Potassium 3.5   Chloride 84(!)   CARBON DIOXIDE 12(!!)   Anion Gap 40(!)   Glucose 753(!!)   BUN,BUNPL 56(!)   Creatinine 2.4(!)   Est, Glom Filt Rate 28(!)   Calcium  10.4   Total Protein 7.5   Albumin 4.7   Albumin/Globulin Ratio 1.7   Total Bilirubin 0.5   Alkaline Phosphatase 135(!)   ALT 14   AST 20  Patient with severe hyperglycemia, metabolic acidosis with elevated anion gap, possible DKA.  Requested beta-hydroxybutyrate, urinalysis, VBG.  Initiated 2 L of IV fluids, indicated 6 units of IV insulin .  Discussed the case with the ICU, patient to be admitted for further  management.   [SC]      ED Course User Index  [NS] Unknown Nat LABOR, APRN - CNP  [SC] Carlena Baston, Melonie, MD        Chronic Conditions affecting care:    has a past medical history of Hypertension and Type 2 diabetes mellitus without complication, without long-term current use of insulin  (HCC) (08/11/2023).    CONSULTS: (Who and What was discussed)  Consult to cardiology as well as cardiac surgery immediately performed due to concern for ischemic limb.  Orders for heparin  placed per attending provider intervention will be provided from cardiology.    Records Reviewed (External, Source and Summary) patient follows with nephrology Dr. Cherisse for stage III CKD history of diabetes slight IDA low vitamin D     CC/HPI Summary, DDx, ED Course, and Reassessment: See HPI and physical above    Patient presenting to ED for acute left upper leg pain on physical examination concern for arterial occlusion left lower leg with no cap refill pulses unable to be palpated found with Doppler or ultrasound cool to touch.  Consulted my attending provider immediately evaluated patient at bedside, ordered CTA with runoff.  Provided patient with morphine  for symptomatic relief    EKG without acute STEMI , see attending provider note for EKG interpretation. , patient currently denies chest pain.      Low suspicion of infectious etiology of patient's symptoms, patient not tachycardic no fever no upper respiratory symptoms noted.  Denies any fever abdominal pain nausea vomiting or diarrhea.  CBC elevated white blood cell count no anemia    CMP resulting glucose at 753 with elevated anion gap see ED course above for correction 2 L of fluid were ordered patient presenting with AKI denied any abdominal pain nausea or  vomiting.    Patient was admitted to ICU for further management as well as intervention from cardiology.    Disposition Considerations (tests considered but not done, Admit vs D/C, Shared Decision Making, Pt Expectation of Test  or Tx.): Admitted in stable condition for continued therapy       The patient tolerated their visit well.  The patient and / or the family were informed of the results of any tests, a time was given to answer questions, a plan was proposed and they agreed with plan.    I am the Primary Clinician of Record.  FINAL IMPRESSION      1. Critical limb ischemia of left lower extremity (HCC)    2. PAD (peripheral artery disease)    3. PVD (peripheral vascular disease)    4. Shortness of breath          DISPOSITION/PLAN     DISPOSITION Admitted 09/12/2024 11:01:58 AM               PATIENT REFERRED TO:  No follow-up provider specified.    DISCHARGE MEDICATIONS:  Current Discharge Medication List          DISCONTINUED MEDICATIONS:  Current Discharge Medication List                 (Please note that portions of this note were completed with a voice recognition program.  Efforts were made to edit the dictations but occasionally words are mis-transcribed.)    Nat DELENA Angelucci, APRN - CNP (electronically signed)        Angelucci Nat DELENA, APRN - CNP  09/12/24 1832    "

## 2024-09-12 NOTE — Consults (Signed)
 "Endocrinology   Consult Note  09/12/2024  8:26 AM     Primary Care provider: Fernand Ruffing, MD     Referring physician:  Melonie Carlena Baston, MD     Dear Doctor    Thank You for the Consult     Pt. Was Admitted for : Left great ischemia with no pop pulse peripheral vascular disease  Also has DKA    Reason for Consult: Better control of blood glucose      History Obtained From:  Patient/ EMR       HISTORY OF PRESENT ILLNESS:                The patient is a 63 y.o. male with significant past medical history of possible diabetes mellitus, hypertension, CKD, peripheral vascular disease, COPD had colonoscopy came in complaining of severe leg pain of the left side and up basically diabetic code no pulsations.  Appears like patient is due to ischemia of the left leg     Reviewed the detailed note from cardiology.  In summary  Patient was taken to cardiac Cath Lab left leg angiogram was done multiple level of blockages were found and tPA catheter was inserted patient was started on tPA infusion drip also started on heparin  infusion.      I was  consulted for better control of blood glucose.       ROS:   Pt's ROS done in detail.  Abnormal ROS are noted in Medical and Surgical History Section below:     Other Medical History:        Diagnosis Date    Hypertension     Type 2 diabetes mellitus without complication, without long-term current use of insulin  (HCC) 08/11/2023     Surgical History:        Procedure Laterality Date    COLONOSCOPY N/A 02/23/2024    COLONOSCOPY POLYPECTOMY SNARE/BIOPSY performed by Madie Dine, MD at [facility] ENDOSCOPY       Allergies:  Patient has no known allergies.    Family History:       Problem Relation Age of Onset    Diabetes Father      REVIEW OF SYSTEMS:  Review of System Done as noted above     PHYSICAL EXAM:      Vitals:    BP (!) 134/92   Pulse (!) 108   Temp 96.9 F (36.1 C) (Axillary)   Resp 19   Ht 1.753 Ezana Hubbert (5' 9)   Wt 46.3 kg (102 lb)   SpO2 98%   BMI 15.06 kg/Sheldon Amara      CONSTITUTIONAL:  awake, alert, cooperative, appears stated age   EYES:  vision intact Fundoscopic Exam not performed   ZWU:Wnmfjo  NECK:  Supple, No JVD.   Thyroid Exam:Normal   LUNGS:  Has Vesicular Breath Sounds,   CARDIOVASCULAR:  Normal apical impulse, regular rate and rhythm, normal S1 and S2, no S3 or S4, and has no  murmur   ABDOMEN:  No scars, normal bowel sounds, soft, non-distended, non-tender, no masses palpated, no hepatolienomegaly  Musculoskeletal: Normal  Extremities: Normal, peripheral pulses normal, , has no edema [still no peripheral pulses noted  To be very cold  NEUROLOGIC:  Awake, alert, oriented to name, place and time.  Cranial nerves II-XII are grossly intact.  Motor is  intact.  Sensory is intact neuropathy. ,  and gait is abnormal.    DATA:    CBC:   Recent Labs     09/12/24  0900 09/12/24  1337 09/12/24  1453   WBC 14.6* 17.7* 17.0*   HGB 15.5 13.6 13.7   PLT 276 227 239    CMP:  Recent Labs     09/12/24  0900 09/12/24  1337 09/12/24  1453   NA 136 143 144   K 3.5 4.9 4.4   CL 84* 102 101   CO2 12* 20* 17*   BUN 56* 53* 53*   CREATININE 2.4* 1.8* 1.8*   CALCIUM  10.4 9.2 9.1   BILITOT 0.5  --   --    ALKPHOS 135*  --   --    AST 20  --   --    ALT 14  --   --      Lipids:   Lab Results   Component Value Date/Time    CHOL 127 12/13/2023 05:01 AM    HDL 38 12/13/2023 05:01 AM    TRIG 86 12/13/2023 05:01 AM     Glucose:   Recent Labs     09/12/24  1618 09/12/24  1807 09/12/24  1909   POCGLU 347* 296* 129*     Hemoglobin A1C:   Lab Results   Component Value Date/Time    LABA1C 11.7 09/12/2024 09:00 AM     Free T4: No results found for: T4FREE  Free T3: No results found for: FT3  TSH High Sensitivity: No results found for: TSHHS    XR CHEST PORTABLE   Final Result      Vascular duplex lower extremity arteries left    (Results Pending)          Scheduled Medicines   Medications:    sodium chloride  flush  5-40 mL IntraVENous 2 times per day    atorvastatin   40 mg Oral Nightly    [START  ON 09/13/2024] aspirin   81 mg Oral Daily    amLODIPine   10 mg Oral Daily    gabapentin   100 mg Oral BID      Infusions:    heparin  (PORCINE) Infusion Stopped (09/12/24 1413)    sodium chloride       dextrose  5% and 0.45% NaCl with KCl 20 mEq      sodium chloride  250 mL/hr at 09/12/24 1529    insulin  0.7 Units/hr (09/12/24 1925)    sodium chloride       heparin  (PORCINE) Infusion 500 Units/hr (09/12/24 1517)    ALTEplase  (CATHFLO) 10 mg in sodium chloride  0.9 % 100 mL infusion           IMPRESSION    Patient Active Problem List   Diagnosis    Chronic kidney disease, stage III (moderate) (HCC)    Primary hypertension    Low vitamin D  level    Type 2 diabetes mellitus without complication, without long-term current use of insulin  (HCC)    Persistent proteinuria    Stroke-like symptoms    Primary insomnia    Chronic kidney disease, stage II (mild)    Acute lower limb ischemia    Critical limb ischemia of left lower extremity (HCC)         RECOMMENDATIONS:      Reviewed POC blood glucose . Labs and X ray results   Reviewed Home and Current Medicines   Will continue on IV insulin  infusion drip   monitor Blood glucose frequently   Also on tPA infusion drip /also on heparin  infusion  Modify  the dose of Insulin   as needed        Will  follow with you  Again thank you for sharing pt's care with me.     Truly yours,       Lourdez Mcgahan Husain Siyana Erney MD  "

## 2024-09-12 NOTE — Progress Notes (Signed)
"  4 Eyes Skin Assessment     NAME:  Joe Avery  DATE OF BIRTH:  11-Feb-1961  MEDICAL RECORD NUMBER:  [id number]    The patient is being assessed for  Transfer to New Unit    I agree that at least one RN has performed a thorough Head to Toe Skin Assessment on the patient. ALL assessment sites listed below have been assessed.      Areas assessed by both nurses:    Head, Face, Ears, Shoulders, Back, Chest, Arms, Elbows, Hands, Sacrum. Buttock, Coccyx, Ischium, Legs. Feet and Heels, and Under Medical Devices         Does the Patient have a Wound? No noted wound(s)       Braden Prevention initiated by RN: No  Wound Care Orders initiated by RN: No    For hospital-acquired stage 1 & 2 and ALL Stage 3,4, Unstageable, DTI, NWPT, and Complex wounds: place order IP Wound Care/Ostomy Nurse Eval and Treat by RN under ORDER ENTRY: No    New Ostomies, if present place, Ostomy referral order under ORDER ENTRY: No     Nurse 1 eSignature: Electronically signed by Bennie Kirk, RN on 09/12/24 at 7:20 PM EST    **SHARE this note so that the co-signing nurse can place an eSignature**    Nurse 2 eSignature: Electronically signed by Alix JINNY Prescott, RN on 09/12/24 at 7:29 PM EST   "

## 2024-09-12 NOTE — Progress Notes (Signed)
"  Patient with cold left foot.    Patient also with blood sugar of 753 and lactic acidosis.    Patient stated that symptoms started this morning.    Patient already heparinized.    No dorsalis pedis or posterior tibialis pulse noted, decreased sensation as well as foot being cool to touch.  Patient states he has difficulty moving ankle down.  Pulses found in right dorsalis pedis and posterior tibialis, sensation/movement intact on the right foot.    Discussed with ER staff.    Discussed peripheral angiogram with patient in great detail as well as risks and benefits.  Patient voiced understand the procedure and was agreeable to proceed.    Plan for emergent peripheral angiogram for cold foot.    Lonni Barefoot PA-C 09/12/24 10:29 AM    "

## 2024-09-13 ENCOUNTER — Inpatient Hospital Stay: Admit: 2024-09-13 | Payer: MEDICARE | Primary: Internal Medicine

## 2024-09-13 LAB — CBC
Hematocrit: 37.5 % — ABNORMAL LOW (ref 42.0–52.0)
Hematocrit: 37.6 % — ABNORMAL LOW (ref 42.0–52.0)
Hematocrit: 32.1 % — ABNORMAL LOW (ref 42.0–52.0)
Hematocrit: 29.2 % — ABNORMAL LOW (ref 42.0–52.0)
Hemoglobin: 13.1 g/dL — ABNORMAL LOW (ref 13.5–18.0)
Hemoglobin: 12.7 g/dL — ABNORMAL LOW (ref 13.5–18.0)
Hemoglobin: 10.8 g/dL — ABNORMAL LOW (ref 13.5–18.0)
Hemoglobin: 9.8 g/dL — ABNORMAL LOW (ref 13.5–18.0)
MCH: 29 pg (ref 27.0–31.0)
MCH: 29 pg (ref 27.0–31.0)
MCH: 29.2 pg (ref 27.0–31.0)
MCH: 29.4 pg (ref 27.0–31.0)
MCHC: 34.9 g/dL (ref 32.0–36.0)
MCHC: 33.8 g/dL (ref 32.0–36.0)
MCHC: 33.6 g/dL (ref 32.0–36.0)
MCHC: 33.6 g/dL (ref 32.0–36.0)
MCV: 83 fL (ref 78.0–100.0)
MCV: 85.8 fL (ref 78.0–100.0)
MCV: 86.8 fL (ref 78.0–100.0)
MCV: 87.7 fL (ref 78.0–100.0)
MPV: 12.7 fL — ABNORMAL HIGH (ref 7.5–11.1)
MPV: 12.6 fL — ABNORMAL HIGH (ref 7.5–11.1)
MPV: 12.7 fL — ABNORMAL HIGH (ref 7.5–11.1)
MPV: 12.6 fL — ABNORMAL HIGH (ref 7.5–11.1)
Platelet, Fluorescence: 184 k/uL (ref 140–440)
Platelets: 230 k/uL (ref 140–440)
Platelets: 204 k/uL (ref 140–440)
Platelets: 175 k/uL (ref 140–440)
RBC: 4.52 m/uL — ABNORMAL LOW (ref 4.60–6.20)
RBC: 4.38 m/uL — ABNORMAL LOW (ref 4.60–6.20)
RBC: 3.7 m/uL — ABNORMAL LOW (ref 4.60–6.20)
RBC: 3.33 m/uL — ABNORMAL LOW (ref 4.60–6.20)
RDW: 12.5 % (ref 11.7–14.9)
RDW: 12.5 % (ref 11.7–14.9)
RDW: 13 % (ref 11.7–14.9)
RDW: 13.1 % (ref 11.7–14.9)
WBC: 17.6 k/uL — ABNORMAL HIGH (ref 4.0–10.5)
WBC: 12.2 k/uL — ABNORMAL HIGH (ref 4.0–10.5)
WBC: 13.5 k/uL — ABNORMAL HIGH (ref 4.0–10.5)
WBC: 12.1 k/uL — ABNORMAL HIGH (ref 4.0–10.5)

## 2024-09-13 LAB — BASIC METABOLIC PANEL
Anion Gap: 13 mmol/L (ref 9–17)
Anion Gap: 8 mmol/L — ABNORMAL LOW (ref 9–17)
Anion Gap: 11 mmol/L (ref 9–17)
Anion Gap: 13 mmol/L (ref 9–17)
Anion Gap: 19 mmol/L — ABNORMAL HIGH (ref 9–17)
BUN: 52 mg/dL — ABNORMAL HIGH (ref 7–20)
BUN: 46 mg/dL — ABNORMAL HIGH (ref 7–20)
BUN: 54 mg/dL — ABNORMAL HIGH (ref 7–20)
BUN: 52 mg/dL — ABNORMAL HIGH (ref 7–20)
BUN: 55 mg/dL — ABNORMAL HIGH (ref 7–20)
CO2: 24 mmol/L (ref 21–32)
CO2: 20 mmol/L — ABNORMAL LOW (ref 21–32)
CO2: 21 mmol/L (ref 21–32)
CO2: 15 mmol/L — ABNORMAL LOW (ref 21–32)
CO2: 12 mmol/L — CL (ref 21–32)
Calcium: 9.2 mg/dL (ref 8.3–10.6)
Calcium: 7.4 mg/dL — ABNORMAL LOW (ref 8.3–10.6)
Calcium: 8.1 mg/dL — ABNORMAL LOW (ref 8.3–10.6)
Calcium: 7.3 mg/dL — ABNORMAL LOW (ref 8.3–10.6)
Calcium: 7.2 mg/dL — ABNORMAL LOW (ref 8.3–10.6)
Chloride: 109 mmol/L (ref 99–110)
Chloride: 106 mmol/L (ref 99–110)
Chloride: 106 mmol/L (ref 99–110)
Chloride: 108 mmol/L (ref 99–110)
Chloride: 105 mmol/L (ref 99–110)
Creatinine: 1.6 mg/dL — ABNORMAL HIGH (ref 0.8–1.3)
Creatinine: 1.5 mg/dL — ABNORMAL HIGH (ref 0.8–1.3)
Creatinine: 1.7 mg/dL — ABNORMAL HIGH (ref 0.8–1.3)
Creatinine: 2.1 mg/dL — ABNORMAL HIGH (ref 0.8–1.3)
Creatinine: 2.4 mg/dL — ABNORMAL HIGH (ref 0.8–1.3)
Est, Glom Filt Rate: 44 mL/min/1.73m2 — ABNORMAL LOW (ref >60)
Est, Glom Filt Rate: 48 mL/min/1.73m2 — ABNORMAL LOW (ref >60)
Est, Glom Filt Rate: 41 mL/min/1.73m2 — ABNORMAL LOW (ref >60)
Est, Glom Filt Rate: 33 mL/min/1.73m2 — ABNORMAL LOW (ref >60)
Est, Glom Filt Rate: 27 mL/min/1.73m2 — ABNORMAL LOW (ref >60)
Glucose: 107 mg/dL — ABNORMAL HIGH (ref 74–99)
Glucose: 629 mg/dL (ref 74–99)
Glucose: 171 mg/dL — ABNORMAL HIGH (ref 74–99)
Glucose: 296 mg/dL — ABNORMAL HIGH (ref 74–99)
Glucose: 473 mg/dL (ref 74–99)
Potassium: 4 mmol/L (ref 3.5–5.1)
Potassium: 6.3 mmol/L (ref 3.5–5.1)
Potassium: 5.1 mmol/L (ref 3.5–5.1)
Potassium: 6.9 mmol/L (ref 3.5–5.1)
Potassium: 5.8 mmol/L — ABNORMAL HIGH (ref 3.5–5.1)
Sodium: 147 mmol/L — ABNORMAL HIGH (ref 136–145)
Sodium: 135 mmol/L — ABNORMAL LOW (ref 136–145)
Sodium: 138 mmol/L (ref 136–145)
Sodium: 135 mmol/L — ABNORMAL LOW (ref 136–145)
Sodium: 136 mmol/L (ref 136–145)

## 2024-09-13 LAB — POCT GLUCOSE
POC Glucose: 129 mg/dL — ABNORMAL HIGH (ref 74–99)
POC Glucose: 118 mg/dL — ABNORMAL HIGH (ref 74–99)
POC Glucose: 193 mg/dL — ABNORMAL HIGH (ref 74–99)
POC Glucose: 237 mg/dL — ABNORMAL HIGH (ref 74–99)
POC Glucose: 232 mg/dL — ABNORMAL HIGH (ref 74–99)
POC Glucose: 139 mg/dL — ABNORMAL HIGH (ref 74–99)
POC Glucose: 147 mg/dL — ABNORMAL HIGH (ref 74–99)
POC Glucose: 174 mg/dL — ABNORMAL HIGH (ref 74–99)
POC Glucose: 209 mg/dL — ABNORMAL HIGH (ref 74–99)
POC Glucose: 288 mg/dL — ABNORMAL HIGH (ref 74–99)
POC Glucose: 231 mg/dL — ABNORMAL HIGH (ref 74–99)
POC Glucose: 113 mg/dL — ABNORMAL HIGH (ref 74–99)
POC Glucose: 203 mg/dL — ABNORMAL HIGH (ref 74–99)
POC Glucose: 484 mg/dL (ref 74–99)
POC Glucose: 167 mg/dL — ABNORMAL HIGH (ref 74–99)
POC Glucose: 217 mg/dL — ABNORMAL HIGH (ref 74–99)
POC Glucose: >600 mg/dL (ref 74–99)
POC Glucose: 313 mg/dL — ABNORMAL HIGH (ref 74–99)
POC Glucose: 297 mg/dL — ABNORMAL HIGH (ref 74–99)

## 2024-09-13 LAB — PHOSPHORUS
Phosphorus: 3.8 mg/dL (ref 2.5–4.9)
Phosphorus: 3.5 mg/dL (ref 2.5–4.9)
Phosphorus: 4.2 mg/dL (ref 2.5–4.9)
Phosphorus: 6.3 mg/dL — ABNORMAL HIGH (ref 2.5–4.9)
Phosphorus: 8.4 mg/dL — ABNORMAL HIGH (ref 2.5–4.9)

## 2024-09-13 LAB — POC ACT-LR: Activated Clotting Time, Low Range: 380 s — ABNORMAL HIGH (ref 89–169)

## 2024-09-13 LAB — CK
Total CK: >22000 U/L — ABNORMAL HIGH (ref 26–192)
Total CK: >22000 U/L — ABNORMAL HIGH (ref 26–192)

## 2024-09-13 LAB — LACTIC ACID
Lactic Acid: 1.3 mmol/L (ref 0.4–2.0)
Lactic Acid: 1.9 mmol/L (ref 0.4–2.0)
Lactic Acid: 1 mmol/L (ref 0.4–2.0)
Lactic Acid: 3.5 mmol/L (ref 0.4–2.0)

## 2024-09-13 LAB — ANTI-XA, HEPARIN
Anti-XA Unfrac Heparin: 0.5 IU/L
Anti-XA Unfrac Heparin: >1.1 IU/L
Anti-XA Unfrac Heparin: >1.1 IU/L

## 2024-09-13 LAB — INVASIVE VASCULAR PROCEDURE: Body Surface Area: 1.5 m2

## 2024-09-13 LAB — MAGNESIUM
Magnesium: 3 mg/dL — ABNORMAL HIGH (ref 1.8–2.4)
Magnesium: 2.5 mg/dL — ABNORMAL HIGH (ref 1.8–2.4)
Magnesium: 2.7 mg/dL — ABNORMAL HIGH (ref 1.8–2.4)
Magnesium: 3 mg/dL — ABNORMAL HIGH (ref 1.8–2.4)
Magnesium: 2.9 mg/dL — ABNORMAL HIGH (ref 1.8–2.4)

## 2024-09-13 LAB — APTT
APTT: 227.4 s — ABNORMAL HIGH (ref 25.1–37.1)
APTT: >240 s — ABNORMAL HIGH (ref 25.1–37.1)
APTT: >240 s — ABNORMAL HIGH (ref 25.1–37.1)

## 2024-09-13 LAB — FIBRINOGEN
Fibrinogen: 326 mg/dL (ref 170–540)
Fibrinogen: 312 mg/dL (ref 170–540)
Fibrinogen: 279 mg/dL (ref 170–540)
Fibrinogen: 266 mg/dL (ref 170–540)

## 2024-09-13 MED ORDER — DEXTROSE 10 % IV SOLN
10 | INTRAVENOUS | Status: DC | PRN
Start: 2024-09-13 — End: 2024-09-13

## 2024-09-13 MED ORDER — NOREPINEPHRINE BITARTRATE 8 MG/250 ML (32 MCG/ML) IN 0.9 % NACL IV
8-0.9 | INTRAVENOUS | Status: AC
Start: 2024-09-13 — End: 2024-09-13
  Administered 2024-09-13: 17:00:00 5 via INTRAVENOUS

## 2024-09-13 MED ORDER — HEPARIN SODIUM (PORCINE) 1000 UNIT/ML IJ SOLN
1000 | Freq: Once | INTRAMUSCULAR | Status: DC
Start: 2024-09-13 — End: 2024-09-13

## 2024-09-13 MED ORDER — SODIUM BICARBONATE 8.4 % IV SOLN
8.4 | Freq: Once | INTRAVENOUS | Status: AC
Start: 2024-09-13 — End: 2024-09-13
  Administered 2024-09-13: 17:00:00 50 meq via INTRAVENOUS

## 2024-09-13 MED ORDER — DEXTROSE 50 % IV SOLN
50 | INTRAVENOUS | Status: AC
Start: 2024-09-13 — End: 2024-09-13

## 2024-09-13 MED ORDER — HEPARIN (PORCINE) IN NACL 25000-0.45 UT/250ML-% IV SOLN
25000-0.45 | INTRAVENOUS | Status: AC | PRN
Start: 2024-09-13 — End: 2024-09-13
  Administered 2024-09-13: 15:00:00 9.037 via INTRAVENOUS

## 2024-09-13 MED ORDER — CALCIUM GLUCONATE-NACL 1-0.675 GM/50ML-% IV SOLN
1-0.675 | Freq: Once | INTRAVENOUS | Status: AC
Start: 2024-09-13 — End: 2024-09-13
  Administered 2024-09-13: 17:00:00 1000 mg via INTRAVENOUS

## 2024-09-13 MED ORDER — LIDOCAINE HCL URETHRAL/MUCOSAL 2 % EX PRSY
2 | Freq: Once | CUTANEOUS | Status: AC
Start: 2024-09-13 — End: 2024-09-13
  Administered 2024-09-13: 16:00:00 via TOPICAL

## 2024-09-13 MED ORDER — GLUCAGON HCL (DIAGNOSTIC) 1 MG IJ SOLR
1 | INTRAMUSCULAR | Status: DC | PRN
Start: 2024-09-13 — End: 2024-09-13

## 2024-09-13 MED ORDER — INSULIN GLARGINE 100 UNIT/ML SC SOLN
100 | Freq: Every evening | SUBCUTANEOUS | Status: DC
Start: 2024-09-13 — End: 2024-09-13

## 2024-09-13 MED ORDER — IODIXANOL 320 MG/ML IV SOLN
320 | INTRAVENOUS | Status: AC
Start: 2024-09-13 — End: 2024-09-13

## 2024-09-13 MED ORDER — IOPAMIDOL 76 % IV SOLN
76 | INTRAVENOUS | Status: AC
Start: 2024-09-13 — End: 2024-09-13

## 2024-09-13 MED ORDER — DEXTROSE 10 % IV BOLUS
INTRAVENOUS | Status: DC | PRN
Start: 2024-09-13 — End: 2024-09-13

## 2024-09-13 MED ORDER — HEPARIN SODIUM (PORCINE) 1000 UNIT/ML IJ SOLN
1000 | INTRAMUSCULAR | Status: DC | PRN
Start: 2024-09-13 — End: 2024-09-13
  Administered 2024-09-13: 14:00:00 9000 via INTRA_ARTERIAL

## 2024-09-13 MED ORDER — GLUCOSE 4 G PO CHEW
4 | ORAL | Status: DC | PRN
Start: 2024-09-13 — End: 2024-09-13

## 2024-09-13 MED ORDER — ALBUTEROL SULFATE (5 MG/ML) 0.5% IN NEBU
Freq: Once | RESPIRATORY_TRACT | Status: AC
Start: 2024-09-13 — End: 2024-09-13
  Administered 2024-09-13: 17:00:00 10 mg via RESPIRATORY_TRACT

## 2024-09-13 MED ORDER — HYDROCORTISONE SOD SUC (PF) 100 MG IJ SOLR (ACT-0-VIAL)
100 | Freq: Three times a day (TID) | INTRAMUSCULAR | Status: DC
Start: 2024-09-13 — End: 2024-09-13
  Administered 2024-09-13: 17:00:00 100 mg via INTRAVENOUS

## 2024-09-13 MED ORDER — SODIUM CHLORIDE 0.9 % IV BOLUS
0.9 | INTRAVENOUS | Status: AC | PRN
Start: 2024-09-13 — End: 2024-09-13
  Administered 2024-09-13: 14:00:00 250 via INTRAVENOUS

## 2024-09-13 MED ORDER — DEXTROSE 10 % IV BOLUS
Freq: Once | INTRAVENOUS | Status: AC
Start: 2024-09-13 — End: 2024-09-13
  Administered 2024-09-13: 18:00:00 250 mL via INTRAVENOUS

## 2024-09-13 MED ORDER — INSULIN REGULAR HUMAN 100 UNIT/100ML NS INFUSION
Status: DC
Start: 2024-09-13 — End: 2024-09-13
  Administered 2024-09-13: 22:00:00 5.1 [IU]/h via INTRAVENOUS

## 2024-09-13 MED ORDER — LACTATED RINGERS IV SOLN
Freq: Once | INTRAVENOUS | Status: AC
Start: 2024-09-13 — End: 2024-09-13
  Administered 2024-09-13: 16:00:00 via INTRAVENOUS

## 2024-09-13 MED ORDER — IODIXANOL 320 MG/ML IV SOLN
320 | INTRAVENOUS | Status: DC | PRN
Start: 2024-09-13 — End: 2024-09-13
  Administered 2024-09-13: 15:00:00 100 via INTRA_ARTERIAL

## 2024-09-13 MED ORDER — SODIUM CHLORIDE 0.9 % IV SOLN
0.9 | INTRAVENOUS | Status: DC
Start: 2024-09-13 — End: 2024-09-13
  Administered 2024-09-13: 20:00:00 0.03 [IU]/min via INTRAVENOUS

## 2024-09-13 MED ORDER — SODIUM ZIRCONIUM CYCLOSILICATE 10 G PO PACK
10 | Freq: Three times a day (TID) | ORAL | Status: DC
Start: 2024-09-13 — End: 2024-09-13

## 2024-09-13 MED ORDER — HEPARIN SODIUM (PORCINE) 1000 UNIT/ML IJ SOLN
1000 | INTRAMUSCULAR | Status: DC | PRN
Start: 2024-09-13 — End: 2024-09-13

## 2024-09-13 MED ORDER — HEPARIN (PORCINE) IN NACL 1000-0.9 UT/500ML-% IV SOLN
1000-0.9 | INTRAVENOUS | Status: AC
Start: 2024-09-13 — End: 2024-09-13

## 2024-09-13 MED ORDER — LACTATED RINGERS IV BOLUS
Freq: Once | INTRAVENOUS | Status: DC
Start: 2024-09-13 — End: 2024-09-13

## 2024-09-13 MED ORDER — INSULIN LISPRO 100 UNIT/ML IJ SOLN
100 | Freq: Every evening | INTRAMUSCULAR | Status: DC
Start: 2024-09-13 — End: 2024-09-13

## 2024-09-13 MED ORDER — IOPAMIDOL 76 % IV SOLN
76 | Freq: Once | INTRAVENOUS | Status: AC | PRN
Start: 2024-09-13 — End: 2024-09-13
  Administered 2024-09-13: 09:00:00 75 mL via INTRAVENOUS

## 2024-09-13 MED ORDER — INSULIN LISPRO 100 UNIT/ML IJ SOLN
100 | Freq: Three times a day (TID) | INTRAMUSCULAR | Status: DC
Start: 2024-09-13 — End: 2024-09-13

## 2024-09-13 MED ORDER — NOREPINEPHRINE BITARTRATE 8 MG/250 ML (32 MCG/ML) IN 0.9 % NACL IV
8-0.9 | INTRAVENOUS | Status: DC
Start: 2024-09-13 — End: 2024-09-13

## 2024-09-13 MED ORDER — STERILE WATER FOR INJECTION (MIXTURES ONLY)
8.4 | INTRAVENOUS | Status: DC
Start: 2024-09-13 — End: 2024-09-13
  Administered 2024-09-13: 22:00:00 via INTRAVENOUS

## 2024-09-13 MED ORDER — CLOPIDOGREL BISULFATE 300 MG PO TABS
300 | Freq: Once | ORAL | Status: DC
Start: 2024-09-13 — End: 2024-09-13

## 2024-09-13 MED ORDER — INSULIN REGULAR HUMAN 100 UNIT/ML IJ SOLN
100 | Freq: Once | INTRAMUSCULAR | Status: DC
Start: 2024-09-13 — End: 2024-09-13

## 2024-09-13 MED ORDER — LACTATED RINGERS IV BOLUS
Freq: Once | INTRAVENOUS | Status: AC
Start: 2024-09-13 — End: 2024-09-13
  Administered 2024-09-13: 15:00:00 500 mL via INTRAVENOUS

## 2024-09-13 MED ORDER — DEXTROSE 50 % IV SOLN
50 | INTRAVENOUS | Status: DC | PRN
Start: 2024-09-13 — End: 2024-09-13
  Administered 2024-09-13: 15:00:00 25 via INTRAVENOUS

## 2024-09-13 MED ORDER — LIDOCAINE HCL 2 % IJ SOLN
2 | INTRAMUSCULAR | Status: AC
Start: 2024-09-13 — End: 2024-09-13

## 2024-09-13 MED ORDER — HEPARIN SODIUM (PORCINE) 5000 UNIT/ML IJ SOLN
5000 | INTRAMUSCULAR | Status: AC
Start: 2024-09-13 — End: 2024-09-13

## 2024-09-13 MED ORDER — EPTIFIBATIDE 20 MG/10ML IV SOLN
20 | INTRAVENOUS | Status: DC | PRN
Start: 2024-09-13 — End: 2024-09-13
  Administered 2024-09-13: 14:00:00 9162 via INTRAVENOUS

## 2024-09-13 MED ORDER — CLOPIDOGREL BISULFATE 75 MG PO TABS
75 | Freq: Every day | ORAL | Status: DC
Start: 2024-09-13 — End: 2024-09-13

## 2024-09-13 MED ORDER — INSULIN REGULAR HUMAN 100 UNIT/ML IJ SOLN
100 | Freq: Once | INTRAMUSCULAR | Status: AC
Start: 2024-09-13 — End: 2024-09-13
  Administered 2024-09-13: 18:00:00 10 [IU] via INTRAVENOUS

## 2024-09-13 MED ORDER — EPTIFIBATIDE 75 MG/100ML IV SOLN
75 | INTRAVENOUS | Status: DC
Start: 2024-09-13 — End: 2024-09-13
  Administered 2024-09-13: 19:00:00 1 ug/kg/min via INTRAVENOUS

## 2024-09-13 MED ORDER — INSULIN LISPRO 100 UNIT/ML IJ SOLN
100 | Freq: Four times a day (QID) | INTRAMUSCULAR | Status: DC
Start: 2024-09-13 — End: 2024-09-13

## 2024-09-13 MED ORDER — SODIUM CHLORIDE (PF) 0.9 % IJ SOLN
0.9 | Freq: Every day | INTRAMUSCULAR | Status: DC
Start: 2024-09-13 — End: 2024-09-13
  Administered 2024-09-13: 17:00:00 40 mg via INTRAVENOUS

## 2024-09-13 MED ORDER — HEPARIN (PORCINE) IN NACL 25000-0.45 UT/250ML-% IV SOLN
25000-0.45 | INTRAVENOUS | Status: DC
Start: 2024-09-13 — End: 2024-09-13
  Administered 2024-09-13: 16:00:00 10 [IU]/kg/h via INTRAVENOUS

## 2024-09-13 MED ORDER — LACTATED RINGERS IV BOLUS
Freq: Once | INTRAVENOUS | Status: AC
Start: 2024-09-13 — End: 2024-09-13
  Administered 2024-09-13: 18:00:00 500 mL via INTRAVENOUS

## 2024-09-13 MED ORDER — HYDROMORPHONE HCL 1 MG/ML IJ SOLN
1 | INTRAMUSCULAR | Status: AC
Start: 2024-09-13 — End: 2024-09-13

## 2024-09-13 MED ORDER — PHENYLEPHRINE HCL (PRESSORS) 10 MG/ML IV SOLN
10 | INTRAVENOUS | Status: DC | PRN
Start: 2024-09-13 — End: 2024-09-13
  Administered 2024-09-13: 14:00:00 100 via INTRAVENOUS

## 2024-09-13 MED ORDER — EPTIFIBATIDE 20 MG/10ML IV SOLN
20 | INTRAVENOUS | Status: AC
Start: 2024-09-13 — End: 2024-09-13

## 2024-09-13 MED ORDER — HYDROMORPHONE HCL 1 MG/ML IJ SOLN
1 | INTRAMUSCULAR | Status: DC | PRN
Start: 2024-09-13 — End: 2024-09-13
  Administered 2024-09-13: 14:00:00 1 via INTRAVENOUS

## 2024-09-13 MED FILL — HEPARIN SODIUM (PORCINE) 5000 UNIT/ML IJ SOLN: 5000 [IU]/mL | INTRAMUSCULAR | Qty: 2

## 2024-09-13 MED FILL — KCL IN DEXTROSE-NACL 20-5-0.45 MEQ/L-%-% IV SOLN: 20-5-0.45 MEQ/L-%-% | INTRAVENOUS | Qty: 1000

## 2024-09-13 MED FILL — HEPARIN (PORCINE) IN NACL 1000-0.9 UT/500ML-% IV SOLN: 1000-0.9 UT/500ML-% | INTRAVENOUS | Qty: 1000

## 2024-09-13 MED FILL — EPTIFIBATIDE 75 MG/100ML IV SOLN: 75 MG/100ML | INTRAVENOUS | Qty: 100

## 2024-09-13 MED FILL — GABAPENTIN 100 MG PO CAPS: 100 mg | ORAL | Qty: 1 | Fill #0

## 2024-09-13 MED FILL — SODIUM BICARBONATE 8.4 % IV SOLN: 8.4 % | INTRAVENOUS | Qty: 50

## 2024-09-13 MED FILL — SODIUM BICARBONATE 8.4 % IV SOLN: 8.4 % | INTRAVENOUS | Qty: 150

## 2024-09-13 MED FILL — PANTOPRAZOLE SODIUM 40 MG IV SOLR: 40 mg | INTRAVENOUS | Qty: 40

## 2024-09-13 MED FILL — EPTIFIBATIDE 20 MG/10ML IV SOLN: 20 MG/10ML | INTRAVENOUS | Qty: 10

## 2024-09-13 MED FILL — INSULIN REGULAR HUMAN 100 UNIT/100ML NS INFUSION: Qty: 100

## 2024-09-13 MED FILL — LACTATED RINGERS IV SOLN: INTRAVENOUS | Qty: 1000

## 2024-09-13 MED FILL — LOKELMA 10 G PO PACK: 10 g | ORAL | Qty: 1

## 2024-09-13 MED FILL — CATHFLO ACTIVASE 2 MG IJ SOLR: 2 mg | INTRAMUSCULAR | Qty: 10

## 2024-09-13 MED FILL — HEPARIN (PORCINE) IN NACL 25000-0.45 UT/250ML-% IV SOLN: 25000-0.45 UT/250ML-% | INTRAVENOUS | Qty: 250

## 2024-09-13 MED FILL — DEXTROSE 10 % IV SOLN: 10 % | INTRAVENOUS | Qty: 250

## 2024-09-13 MED FILL — DEXTROSE 50 % IV SOLN: 50 % | INTRAVENOUS | Qty: 50

## 2024-09-13 MED FILL — ISOVUE-370 76 % IV SOLN: 76 % | INTRAVENOUS | Qty: 100

## 2024-09-13 MED FILL — DILAUDID 1 MG/ML IJ SOLN: 1 mg/mL | INTRAMUSCULAR | Qty: 0.5 | Fill #0

## 2024-09-13 MED FILL — CALCIUM GLUCONATE-NACL 1-0.675 GM/50ML-% IV SOLN: 1-0.675 GM/50ML-% | INTRAVENOUS | Qty: 50

## 2024-09-13 MED FILL — DILAUDID 1 MG/ML IJ SOLN: 1 mg/mL | INTRAMUSCULAR | Qty: 0.5

## 2024-09-13 MED FILL — SOLU-CORTEF 100 MG IJ SOLR: 100 mg | INTRAMUSCULAR | Qty: 2

## 2024-09-13 MED FILL — HUMULIN R 100 UNIT/ML IJ SOLN: 100 [IU]/mL | INTRAMUSCULAR | Qty: 10

## 2024-09-13 MED FILL — LIDOCAINE HCL URETHRAL/MUCOSAL 2 % EX PRSY: 2 % | CUTANEOUS | Qty: 10

## 2024-09-13 MED FILL — LIDOCAINE HCL 2 % IJ SOLN: 2 % | INTRAMUSCULAR | Qty: 10

## 2024-09-13 MED FILL — ATORVASTATIN CALCIUM 40 MG PO TABS: 40 mg | ORAL | Qty: 1 | Fill #0

## 2024-09-13 MED FILL — NOREPINEPHRINE BITARTRATE 8 MG/250 ML (32 MCG/ML) IN 0.9 % NACL IV: 8-0.9 MG/250ML-% | INTRAVENOUS | Qty: 250

## 2024-09-13 MED FILL — VASOPRESSIN 20 UNIT/ML IV SOLN: 20 [IU]/mL | INTRAVENOUS | Qty: 1

## 2024-09-13 MED FILL — CLOPIDOGREL BISULFATE 300 MG PO TABS: 300 mg | ORAL | Qty: 2

## 2024-09-13 MED FILL — ONDANSETRON HCL 4 MG/2ML IJ SOLN: 4 MG/2ML | INTRAMUSCULAR | Qty: 2

## 2024-09-13 MED FILL — HYDROMORPHONE HCL 1 MG/ML IJ SOLN: 1 mg/mL | INTRAMUSCULAR | Qty: 1

## 2024-09-13 MED FILL — KCL IN DEXTROSE-NACL 20-5-0.45 MEQ/L-%-% IV SOLN: 20-5-0.45 MEQ/L-%-% | INTRAVENOUS | Qty: 1000 | Fill #0

## 2024-09-13 MED FILL — IODIXANOL 320 MG/ML IV SOLN: 320 mg/mL | INTRAVENOUS | Qty: 100

## 2024-09-13 NOTE — Progress Notes (Signed)
"  Pt transferred back to ICU at this time.     Insulin  gtt stopped intra-op per report. Systemic heparin  gtt and 20ml/hr KVO infusing  "

## 2024-09-13 NOTE — Progress Notes (Signed)
"  POC blood glucose 167  "

## 2024-09-13 NOTE — Plan of Care (Signed)
"    Problem: Chronic Conditions and Co-morbidities  Goal: Patient's chronic conditions and co-morbidity symptoms are monitored and maintained or improved  09/13/2024 1656 by Carleton Duwaine MANIFOLD, RN  Outcome: Completed  09/13/2024 0643 by Deedra Allean Browning, RN  Outcome: Not Progressing     Problem: Discharge Planning  Goal: Discharge to home or other facility with appropriate resources  09/13/2024 1656 by Carleton Duwaine MANIFOLD, RN  Outcome: Completed  09/13/2024 0643 by Deedra Allean Browning, RN  Outcome: Not Progressing     Problem: Safety - Adult  Goal: Free from fall injury  09/13/2024 1656 by Carleton Duwaine MANIFOLD, RN  Outcome: Completed  09/13/2024 0643 by Deedra Allean Browning, RN  Outcome: Not Progressing     Problem: Pain  Goal: Verbalizes/displays adequate comfort level or baseline comfort level  09/13/2024 1656 by Carleton Duwaine MANIFOLD, RN  Outcome: Completed  09/13/2024 0643 by Deedra Allean Browning, RN  Outcome: Not Progressing     "

## 2024-09-13 NOTE — Progress Notes (Signed)
"  Report called to Hannah,RN on [address]. All questions answered.  "

## 2024-09-13 NOTE — Progress Notes (Signed)
"  Spiritual Health Progress Note  [place]      Room # 2129/2129-A    Name: Joe Avery           Age: 63 y.o.    Gender: male          MRN: [id number]  Religion: None       Preferred Language: English      Date: 09/13/24  Visit Time: Begin Time: 1426 End Time : 1447  Complexity of Encounter: Low      Visit Summary: Chaplain met with patient in response to referral from pt's family. Patient did not express any particular spiritual beliefs or resources.  Patient stated they did have a sense of social support. Social support includes parent/ extended family. Chaplain provided active listening/  discussion of illness/ theological discussion/ . Chaplain's plan of care includes follow up at patient's request. Family or visitors were present during visit. Family/visitors included parent/ brother.    Referral/Consult From: Family  Encounter Overview/Reason: Spiritual/Emotional Needs  Encounter Code:     Crisis (if applicable):    Service Provided For: Patient and family together     Patient was available.    Faith, Belief, Meaning:   Patient does not identify as spiritual  is not connected with a faith tradition or spiritual practice  Family/Friends identifies as spiritual  are connected with a faith tradition or spiritual practice  have beliefs or practices that help with coping during difficult times  faith/ spirituality is a source of strength  Rituals (if applicable)      Importance and Influence:  Patient does not have spiritual/personal beliefs that influence decisions regarding their health  Family/Friends has spiritual/personal beliefs that influence decisions regarding the patient's health    Community:  Patient   indicated that they feel well-supported  Support System Includes   Parent/s  Extended family  Family/Friends   indicated that they feel well-supported  Support System Includes   Children  Extended family    Assessment and Plan of Care:   Emotions Expressed by Patient:   Assessment: Calm,  Fearful, Hopeful, Questioning meaning and purpose    Interventions by Chaplain:   Intervention: Active listening, Discussed belief system/religious practices/faith, Discussed illness injury and its impact, Discussed relationship with God, Explored/Affirmed feelings, thoughts, concerns, Nurtured Hope, [employer] Care[employer] Adviser of presence     Result/ Response by Patient:   Outcome: Comfort, Expressed feelings, needs, and concerns, Expressed Gratitude, Less anxious, Less agitated, Receptive    Patient Plan of Care:   Plan and Referrals  Plan/Referrals: Continue Support (comment)     Emotions Expressed by Spouse/Family/Friends:   anxious  fearful  hopeful  frustrated  thankful  worried    Chaplain Interventions with Spouse/ Family/Friends include:   active listening  explored belief system  facilitated expression of thoughts and feelings    Spouse/Family/Friends Plan of Care:   Spiritual care available upon referral.      Electronically signed by  Chaplain Dick Jama Agent  [facility]  Spiritual Health [phone]  [email or web address]  "

## 2024-09-13 NOTE — Care Coordination (Signed)
"  Chart reviewed. Pt transfer to [facility] has been initiated. Pt will be transferred to [facility]. Cm to remain available if any specific needs arise.   "

## 2024-09-13 NOTE — Progress Notes (Signed)
 "    V2.0  [facility] Critical care Progress Note      Name:  Joe Avery DOB/Age/Sex: 1961/10/24  (63 y.o. male)   MRN & CSN:  [id number] & [id number] Encounter Date/Time: 09/13/2024 11:31 AM EST    Location:  2129/2129-A PCP: Fernand Ruffing, MD       Hospital Day: 2    Assessment and Plan:   Joe Avery is a 63 y.o. male with pmh of  who presents with Acute lower limb ischemia      Plan:  Left lower extremity ischemia ( occluded from left common femoral distally) s/p intravascular thrombolysis 11/04  AKI on CKD III, possibly s/t hypotension vs post renal obstructive cause  Rhabdomyolysis (myonecrosis left lower extremity)  Hyperkalemia, metabolic acidosis, hyperphosphatemia in the setting of AKI  PVD  Lactic acidosis   DKA  DM II, poorly controlled   HTN  Severe protein energy malnutrition     Neuro: drowsy this am post procedure, Neurovascular checks LLE as below, neuro checks per protocol, multimodal pain management in place.      Cardio: Started on Levophed  added stress dose steroids, will add vasopressin  if MAP is not maintaining > 65 mmHg   admitted with left LE ischemia, absent left pedal pulses, heparin  infusion started in ED. Underwent intravascular thrombolysis this am, Plavix  load followed by DAPT today  Neurovascular checks per protocol. Echo ordered and pending. Antihypertensives available for goal SBP <160 .      Resp: No acute concerns, tolerating RA, goal SpO2 >92%. Encourage good pulm hygiene and IS use.      GI: No acute concerns, GI prophy in place, bowel regimen.  Diet    GU: Creat 2.1 this morning, worsening renal function, with hyperkalemia and acidosis, ordered hyperkalemia protocol, follow every 6 hours labs, replace electrolytes as needed, so far received 1.5 L of fluids, CK level-> 22,000, monitor Q6 levels, fluids as needed, added Levophed  to maintain MAP> 65 mmHg  Consulted urology for Foley placement after unsuccessful attempts by MD and RNs  baseline creat ~1.2. Monitor strict I&Os.      Heme:   Hgb 10.8, (down from 12.7 overnight) plt 175K. Heparin  gtt and TPA as above. Monitor for s/sx outward bleeding, currently none. Transfuse for goal Hgb >7.      ID: WBC 13.5, afebrile, lactic 3.5 this a.m. (11.7 on admission), suspect reactive in setting of limb ischemia. No obvious s/sx infection. Monitor off antibiotics currently. Trend CBC and fever curve daily. Trend lactic q6 hrs.      Endo: Patient was on insulin  drip per DKA protocol on admission, off insulin  drip, blood glucose levels maintaining<150 mg/DL, SSI to maintain euglycemic range, held Lantus  and scheduled insulin  as patient is not eating yet and risk of hypoglycemia will uptitrate as needed to maintain euglycemic range    MSK: Neurovascular and neuro checks per protocol, PT/OT as able, pain regimen in place, bedrest given sheath in place for now.        Diet Diet NPO   DVT Prophylaxis []  Lovenox , [x]   Heparin , []  SCDs, []  Ambulation,  []  Eliquis, []  Xarelto  []  Coumadin   Code Status Full Code   Disposition From: Home  Expected Disposition: TBD  Estimated Date of Discharge: TBD  Patient requires continued admission due to ischemic left lower extremity   Surrogate Decision Maker/ POA Parent     Subjective:     Chief Complaint: Fatigue (Reports has been feeling weak, unable to eat for couple  of weeks )       Joe Avery is a 63 y.o. male who presents with DKA and noted left lower extremity ischemia, CT imaging study showed occluded left common femoral artery distally, with heterogeneous musculature throughout lower extremity compatible with ischemia and developing myonecrosis.  Started on heparin  drip, consulted cardiology, underwent angiogram bilateral lower extremities with thrombectomy on 11/3, underwent repeat thrombectomy this morning, plan to load with Plavix  and start on DAPT.  Noted worsening renal function with hyperkalemia and metabolic acidosis and rhabdomyolysis with CK level> 22,000.  Fluids, hyper-K protocol, vasopressors to maintain  MAP> 65 mmHg, stress dose steroids, monitor closely-vascular checks, monitor urine output,, consulted urology for assistance with Foley placement.         Review of Systems:    Review of Systems  Patient is lethargic, oriented to self and place, complaining of pain left lower extremity denies SOB, palpitations, nausea/vomiting/abdominal pain, motor or sensory changes    Objective:     Intake/Output Summary (Last 24 hours) at 09/13/2024 1131  Last data filed at 09/13/2024 9061  Gross per 24 hour   Intake 2725.96 ml   Output 990 ml   Net 1735.96 ml        Vitals:   Vitals:    09/13/24 1019   BP:    Pulse: 97   Resp: 17   Temp:    SpO2:        Physical Exam:     General: Patient is a chronically ill-appearing 63 year old male,   Eyes: EOMI  ENT: neck supple, hearing is intact  Cardiovascular: Regular rate.  Respiratory: Clear to auscultation, no wheeze, rales, rhonchi  Gastrointestinal: Soft, non tender  Genitourinary: no suprapubic tenderness  Musculoskeletal: No edema, cyanotic discoloration left lower extremity, no palpable pulses/nor with Doppler U/S, warmer than on admission,  Skin: warm, dry  Neuro: Drowsy postprocedure, follows commands hypophonic speech, no lateralizing deficits  Psych: Mood appropriate.     Medications:   Medications:    pantoprazole  (PROTONIX ) 40 mg in sodium chloride  (PF) 0.9 % 10 mL injection  40 mg IntraVENous Daily    [Held by provider] insulin  lispro  15 Units SubCUTAneous TID WC    [Held by provider] insulin  glargine  30 Units SubCUTAneous Nightly    insulin  lispro  0-8 Units SubCUTAneous 4x Daily AC & HS    clopidogrel   600 mg Oral Once    [START ON 09/14/2024] clopidogrel   75 mg Oral Daily    norepinephrine         calcium  gluconate  1,000 mg IntraVENous Once    albuterol   10 mg Nebulization Once    sodium bicarbonate   50 mEq IntraVENous Once    sodium zirconium cyclosilicate   10 g Oral TID    lactated ringers   500 mL IntraVENous Once    sodium chloride  flush  5-40 mL IntraVENous 2 times  per day    atorvastatin   40 mg Oral Nightly    aspirin   81 mg Oral Daily    amLODIPine   10 mg Oral Daily    gabapentin   100 mg Oral BID      Infusions:    heparin  (PORCINE) Infusion 10 Units/kg/hr (09/13/24 1121)    norepinephrine       dextrose       sodium chloride       dextrose  5% and 0.45% NaCl with KCl 20 mEq 150 mL/hr at 09/12/24 2023    sodium chloride  250 mL/hr at 09/13/24 0506    sodium chloride   20 mL/hr (09/13/24 0820)    ALTEplase  (CATHFLO) 10 mg in sodium chloride  0.9 % 100 mL infusion Stopped (09/13/24 0843)     PRN Meds: heparin  (porcine), 80 Units/kg, PRN  heparin  (porcine), 40 Units/kg, PRN  norepinephrine , ,   glucose, 4 tablet, PRN  dextrose  bolus, 125 mL, PRN   Or  dextrose  bolus, 250 mL, PRN  glucagon , 1 mg, PRN  dextrose , , Continuous PRN  sodium chloride  flush, 5-40 mL, PRN  sodium chloride , , PRN  polyethylene glycol, 17 g, Daily PRN  acetaminophen , 650 mg, Q6H PRN  dextrose  bolus, 125 mL, PRN   Or  dextrose  bolus, 250 mL, PRN  potassium chloride , 10 mEq, PRN  magnesium  sulfate, 2,000 mg, PRN  sodium phosphate  15 mmol in sodium chloride  0.9 % 250 mL IVPB, 15 mmol, PRN  dextrose  5% and 0.45% NaCl with KCl 20 mEq, , Continuous PRN  senna, 1 tablet, Daily PRN  acetaminophen , 650 mg, Q4H PRN  ondansetron , 4 mg, Q8H PRN   Or  ondansetron , 4 mg, Q6H PRN  sodium chloride , , Continuous PRN  HYDROmorphone , 0.5 mg, Q3H PRN  hydrALAZINE , 10 mg, Q4H PRN        Labs      Recent Results (from the past 24 hours)   CBC    Collection Time: 09/12/24  1:37 PM   Result Value Ref Range    WBC 17.7 (H) 4.0 - 10.5 k/uL    RBC 4.61 4.60 - 6.20 m/uL    Hemoglobin 13.6 13.5 - 18.0 g/dL    Hematocrit 61.1 (L) 42.0 - 52.0 %    MCV 84.2 78.0 - 100.0 fL    MCH 29.5 27.0 - 31.0 pg    MCHC 35.1 32.0 - 36.0 g/dL    RDW 87.6 88.2 - 85.0 %    Platelets 227 140 - 440 k/uL    MPV 12.5 (H) 7.5 - 11.1 fL   Basic metabolic panel    Collection Time: 09/12/24  1:37 PM   Result Value Ref Range    Sodium 143 136 - 145 mmol/L    Potassium  4.9 3.5 - 5.1 mmol/L    Chloride 102 99 - 110 mmol/L    CO2 20 (L) 21 - 32 mmol/L    Anion Gap 22 (H) 9 - 17 mmol/L    Glucose 522 (HH) 74 - 99 mg/dL    BUN 53 (H) 7 - 20 mg/dL    Creatinine 1.8 (H) 0.8 - 1.3 mg/dL    Est, Glom Filt Rate 38 (L) >60 mL/min/1.34m2    Calcium  9.2 8.3 - 10.6 mg/dL   Fibrinogen    Collection Time: 09/12/24  1:37 PM   Result Value Ref Range    Fibrinogen 325 170 - 540 mg/dL   Beta-Hydroxybutyrate    Collection Time: 09/12/24  1:37 PM   Result Value Ref Range    Beta-Hydroxybutyrate 5.74 (H) 0.00 - 0.27 mmol/L   Anti-XA, Heparin     Collection Time: 09/12/24  1:37 PM   Result Value Ref Range    Anti-XA Unfrac Heparin  >1.10 (HH) IU/L   POCT Glucose    Collection Time: 09/12/24  2:02 PM   Result Value Ref Range    POC Glucose 477 (HH) 74 - 99 mg/dL   Echo (TTE) complete (PRN contrast/bubble/strain/3D)    Collection Time: 09/12/24  2:30 PM   Result Value Ref Range    LV EDV A4C 37 mL    LV ESV A4C 24 mL  IVSd 0.8 0.6 - 1.0 cm    LVIDd 3.1 (A) 4.2 - 5.9 cm    LVIDs 1.9 cm    LVOT Diameter 1.9 cm    LVOT Mean Gradient 3 mmHg    LVOT VTI 14.8 cm    LVOT Peak Velocity 1.2 m/s    LVOT Peak Gradient 5 mmHg    LVPWd 0.7 0.6 - 1.0 cm    LV Ejection Fraction A4C 36 %    LVOT Area 2.8 cm2    LVOT SV 42.0 ml    AV Mean Gradient 3 mmHg    AV VTI 15.6 cm    AV Mean Velocity 0.8 m/s    AV Peak Velocity 1.1 m/s    AV Peak Gradient 5 mmHg    AV Area by VTI 2.7 cm2    AV Area by Peak Velocity 2.9 cm2    IVC Proxmal 0.9 cm    LA Diameter 1.3 cm    RV Mid Dimension 2.5 cm    TR Max Velocity 2.09 m/s    TR Peak Gradient 17 mmHg    Body Surface Area 1.5 m2    Fractional Shortening 2D 39 28 - 44 %    LV ESV Index A4C 15 mL/m2    LV EDV Index A4C 24 mL/m2    LVIDd Index 2.00 cm/m2    LVIDs Index 1.23 cm/m2    LV RWT Ratio 0.45     LV Mass 2D 56.8 (A) 88 - 224 g    LV Mass 2D Index 36.6 (A) 49 - 115 g/m2    LVOT Stroke Volume Index 27.1 mL/m2    LA Size Index 0.84 cm/m2    AV Velocity Ratio 1.09     LVOT:AV VTI  Index 0.95     AVA/BSA VTI 1.7 cm2/m2    AVA/BSA Peak Velocity 1.9 cm2/m2    EF Physician 55 %   Lactic Acid Now and in 2 Hours    Collection Time: 09/12/24  2:53 PM   Result Value Ref Range    Lactic Acid 3.7 (HH) 0.4 - 2.0 mmol/L   Anti-Xa, Unfractionated Heparin     Collection Time: 09/12/24  2:53 PM   Result Value Ref Range    Anti-XA Unfrac Heparin  >1.10 (HH) IU/L   Basic Metabolic Panel    Collection Time: 09/12/24  2:53 PM   Result Value Ref Range    Sodium 144 136 - 145 mmol/L    Potassium 4.4 3.5 - 5.1 mmol/L    Chloride 101 99 - 110 mmol/L    CO2 17 (L) 21 - 32 mmol/L    Anion Gap 26 (H) 9 - 17 mmol/L    Glucose 501 (HH) 74 - 99 mg/dL    BUN 53 (H) 7 - 20 mg/dL    Creatinine 1.8 (H) 0.8 - 1.3 mg/dL    Est, Glom Filt Rate 39 (L) >60 mL/min/1.73m2    Calcium  9.1 8.3 - 10.6 mg/dL   Magnesium     Collection Time: 09/12/24  2:53 PM   Result Value Ref Range    Magnesium  2.8 (H) 1.8 - 2.4 mg/dL   Phosphorus    Collection Time: 09/12/24  2:53 PM   Result Value Ref Range    Phosphorus 3.0 2.5 - 4.9 mg/dL   CBC    Collection Time: 09/12/24  2:53 PM   Result Value Ref Range    WBC 17.0 (H) 4.0 - 10.5 k/uL    RBC 4.74 4.60 -  6.20 m/uL    Hemoglobin 13.7 13.5 - 18.0 g/dL    Hematocrit 60.0 (L) 42.0 - 52.0 %    MCV 84.2 78.0 - 100.0 fL    MCH 28.9 27.0 - 31.0 pg    MCHC 34.3 32.0 - 36.0 g/dL    RDW 87.6 88.2 - 85.0 %    Platelets 239 140 - 440 k/uL    MPV 12.6 (H) 7.5 - 11.1 fL   APTT    Collection Time: 09/12/24  2:53 PM   Result Value Ref Range    APTT >240.0 (H) 25.1 - 37.1 sec   Fibrinogen    Collection Time: 09/12/24  2:53 PM   Result Value Ref Range    Fibrinogen 323 170 - 540 mg/dL   POCT Glucose    Collection Time: 09/12/24  3:24 PM   Result Value Ref Range    POC Glucose 464 (HH) 74 - 99 mg/dL   POCT Glucose    Collection Time: 09/12/24  4:18 PM   Result Value Ref Range    POC Glucose 347 (H) 74 - 99 mg/dL   POCT Glucose    Collection Time: 09/12/24  6:07 PM   Result Value Ref Range    POC Glucose 296 (H) 74 - 99  mg/dL   POCT Glucose    Collection Time: 09/12/24  7:09 PM   Result Value Ref Range    POC Glucose 129 (H) 74 - 99 mg/dL   EKG 12 lead    Collection Time: 09/12/24  7:45 PM   Result Value Ref Range    Ventricular Rate 103 BPM    Atrial Rate 103 BPM    P-R Interval 122 ms    QRS Duration 88 ms    Q-T Interval 416 ms    QTc Calculation (Bazett) 544 ms    P Axis 79 degrees    R Axis 104 degrees    T Axis 73 degrees    Diagnosis       Sinus tachycardia  Biatrial enlargement  Rightward axis  Prolonged QT  Abnormal ECG  When compared with ECG of 12-Dec-2023 12:44,  Incomplete right bundle branch block is no longer present  QT has lengthened     Basic Metabolic Panel    Collection Time: 09/12/24  7:50 PM   Result Value Ref Range    Sodium 147 (H) 136 - 145 mmol/L    Potassium 4.0 3.5 - 5.1 mmol/L    Chloride 109 99 - 110 mmol/L    CO2 24 21 - 32 mmol/L    Anion Gap 13 9 - 17 mmol/L    Glucose 107 (H) 74 - 99 mg/dL    BUN 52 (H) 7 - 20 mg/dL    Creatinine 1.6 (H) 0.8 - 1.3 mg/dL    Est, Glom Filt Rate 44 (L) >60 mL/min/1.88m2    Calcium  9.2 8.3 - 10.6 mg/dL   Magnesium     Collection Time: 09/12/24  7:50 PM   Result Value Ref Range    Magnesium  3.0 (H) 1.8 - 2.4 mg/dL   Phosphorus    Collection Time: 09/12/24  7:50 PM   Result Value Ref Range    Phosphorus 3.8 2.5 - 4.9 mg/dL   CBC    Collection Time: 09/12/24  7:50 PM   Result Value Ref Range    WBC 17.6 (H) 4.0 - 10.5 k/uL    RBC 4.52 (L) 4.60 - 6.20 m/uL    Hemoglobin 13.1 (L)  13.5 - 18.0 g/dL    Hematocrit 62.4 (L) 42.0 - 52.0 %    MCV 83.0 78.0 - 100.0 fL    MCH 29.0 27.0 - 31.0 pg    MCHC 34.9 32.0 - 36.0 g/dL    RDW 87.4 88.2 - 85.0 %    Platelets 230 140 - 440 k/uL    MPV 12.7 (H) 7.5 - 11.1 fL   APTT    Collection Time: 09/12/24  7:50 PM   Result Value Ref Range    APTT 227.4 (H) 25.1 - 37.1 sec   Fibrinogen    Collection Time: 09/12/24  7:50 PM   Result Value Ref Range    Fibrinogen 326 170 - 540 mg/dL   POCT Glucose    Collection Time: 09/12/24  8:18 PM   Result  Value Ref Range    POC Glucose 118 (H) 74 - 99 mg/dL   POCT Glucose    Collection Time: 09/12/24  9:19 PM   Result Value Ref Range    POC Glucose 193 (H) 74 - 99 mg/dL   Lactic Acid    Collection Time: 09/12/24  9:50 PM   Result Value Ref Range    Lactic Acid 1.3 0.4 - 2.0 mmol/L   POCT Glucose    Collection Time: 09/12/24 10:46 PM   Result Value Ref Range    POC Glucose 237 (H) 74 - 99 mg/dL   POCT Glucose    Collection Time: 09/12/24 11:41 PM   Result Value Ref Range    POC Glucose 232 (H) 74 - 99 mg/dL   Basic Metabolic Panel    Collection Time: 09/13/24 12:15 AM   Result Value Ref Range    Sodium 135 (L) 136 - 145 mmol/L    Potassium 6.3 (HH) 3.5 - 5.1 mmol/L    Chloride 106 99 - 110 mmol/L    CO2 20 (L) 21 - 32 mmol/L    Anion Gap 8 (L) 9 - 17 mmol/L    Glucose 629 (HH) 74 - 99 mg/dL    BUN 46 (H) 7 - 20 mg/dL    Creatinine 1.5 (H) 0.8 - 1.3 mg/dL    Est, Glom Filt Rate 48 (L) >60 mL/min/1.48m2    Calcium  7.4 (L) 8.3 - 10.6 mg/dL   Magnesium     Collection Time: 09/13/24 12:15 AM   Result Value Ref Range    Magnesium  2.5 (H) 1.8 - 2.4 mg/dL   Phosphorus    Collection Time: 09/13/24 12:15 AM   Result Value Ref Range    Phosphorus 3.5 2.5 - 4.9 mg/dL   Lactic Acid    Collection Time: 09/13/24 12:15 AM   Result Value Ref Range    Lactic Acid 1.9 0.4 - 2.0 mmol/L   POCT Glucose    Collection Time: 09/13/24 12:44 AM   Result Value Ref Range    POC Glucose 139 (H) 74 - 99 mg/dL   POCT Glucose    Collection Time: 09/13/24  1:49 AM   Result Value Ref Range    POC Glucose 147 (H) 74 - 99 mg/dL   CBC    Collection Time: 09/13/24  2:05 AM   Result Value Ref Range    WBC 12.2 (H) 4.0 - 10.5 k/uL    RBC 4.38 (L) 4.60 - 6.20 m/uL    Hemoglobin 12.7 (L) 13.5 - 18.0 g/dL    Hematocrit 62.3 (L) 42.0 - 52.0 %    MCV 85.8 78.0 - 100.0 fL  MCH 29.0 27.0 - 31.0 pg    MCHC 33.8 32.0 - 36.0 g/dL    RDW 87.4 88.2 - 85.0 %    Platelets 204 140 - 440 k/uL    MPV 12.6 (H) 7.5 - 11.1 fL   Fibrinogen    Collection Time: 09/13/24  2:05 AM    Result Value Ref Range    Fibrinogen 312 170 - 540 mg/dL   Anti-Xa, Unfractionated Heparin     Collection Time: 09/13/24  3:55 AM   Result Value Ref Range    Anti-XA Unfrac Heparin  0.50 IU/L   Basic Metabolic Panel    Collection Time: 09/13/24  3:55 AM   Result Value Ref Range    Sodium 138 136 - 145 mmol/L    Potassium 5.1 3.5 - 5.1 mmol/L    Chloride 106 99 - 110 mmol/L    CO2 21 21 - 32 mmol/L    Anion Gap 11 9 - 17 mmol/L    Glucose 171 (H) 74 - 99 mg/dL    BUN 54 (H) 7 - 20 mg/dL    Creatinine 1.7 (H) 0.8 - 1.3 mg/dL    Est, Glom Filt Rate 41 (L) >60 mL/min/1.57m2    Calcium  8.1 (L) 8.3 - 10.6 mg/dL   Magnesium     Collection Time: 09/13/24  3:55 AM   Result Value Ref Range    Magnesium  2.7 (H) 1.8 - 2.4 mg/dL   Phosphorus    Collection Time: 09/13/24  3:55 AM   Result Value Ref Range    Phosphorus 4.2 2.5 - 4.9 mg/dL   POCT Glucose    Collection Time: 09/13/24  4:01 AM   Result Value Ref Range    POC Glucose 174 (H) 74 - 99 mg/dL   POCT Glucose    Collection Time: 09/13/24  6:04 AM   Result Value Ref Range    POC Glucose 209 (H) 74 - 99 mg/dL   Lactic Acid    Collection Time: 09/13/24  6:20 AM   Result Value Ref Range    Lactic Acid 1.0 0.4 - 2.0 mmol/L   POCT Glucose    Collection Time: 09/13/24  7:32 AM   Result Value Ref Range    POC Glucose 288 (H) 74 - 99 mg/dL   POCT Glucose    Collection Time: 09/13/24  8:23 AM   Result Value Ref Range    POC Glucose 231 (H) 74 - 99 mg/dL   POC ACT-Low Range    Collection Time: 09/13/24  8:56 AM   Result Value Ref Range    Activated Clotting Time, Low Range 380 (H) 89 - 169 sec   POCT Glucose    Collection Time: 09/13/24  9:31 AM   Result Value Ref Range    POC Glucose 113 (H) 74 - 99 mg/dL   POCT Glucose    Collection Time: 09/13/24  9:44 AM   Result Value Ref Range    POC Glucose 203 (H) 74 - 99 mg/dL   Invasive vascular procedure    Collection Time: 09/13/24  9:57 AM   Result Value Ref Range    Body Surface Area 1.5 m2   CBC    Collection Time: 09/13/24 10:35 AM    Result Value Ref Range    WBC 13.5 (H) 4.0 - 10.5 k/uL    RBC 3.70 (L) 4.60 - 6.20 m/uL    Hemoglobin 10.8 (L) 13.5 - 18.0 g/dL    Hematocrit 67.8 (L) 42.0 - 52.0 %  MCV 86.8 78.0 - 100.0 fL    MCH 29.2 27.0 - 31.0 pg    MCHC 33.6 32.0 - 36.0 g/dL    RDW 86.9 88.2 - 85.0 %    Platelets 175 140 - 440 k/uL    MPV 12.7 (H) 7.5 - 11.1 fL   APTT    Collection Time: 09/13/24 10:35 AM   Result Value Ref Range    APTT PENDING sec   Fibrinogen    Collection Time: 09/13/24 10:35 AM   Result Value Ref Range    Fibrinogen 279 170 - 540 mg/dL   Lactic Acid    Collection Time: 09/13/24 10:35 AM   Result Value Ref Range    Lactic Acid 3.5 (HH) 0.4 - 2.0 mmol/L   Basic Metabolic Panel    Collection Time: 09/13/24 10:35 AM   Result Value Ref Range    Sodium 135 (L) 136 - 145 mmol/L    Potassium 6.9 (HH) 3.5 - 5.1 mmol/L    Chloride 108 99 - 110 mmol/L    CO2 15 (L) 21 - 32 mmol/L    Anion Gap 13 9 - 17 mmol/L    Glucose 296 (H) 74 - 99 mg/dL    BUN 52 (H) 7 - 20 mg/dL    Creatinine 2.1 (H) 0.8 - 1.3 mg/dL    Est, Glom Filt Rate 33 (L) >60 mL/min/1.60m2    Calcium  7.3 (L) 8.3 - 10.6 mg/dL   CK    Collection Time: 09/13/24 10:35 AM   Result Value Ref Range    Total CK >22,000 (H) 26 - 192 U/L   Anti-XA, Heparin     Collection Time: 09/13/24 10:35 AM   Result Value Ref Range    Anti-XA Unfrac Heparin  PENDING IU/L   Magnesium     Collection Time: 09/13/24 10:35 AM   Result Value Ref Range    Magnesium  3.0 (H) 1.8 - 2.4 mg/dL   Phosphorus    Collection Time: 09/13/24 10:35 AM   Result Value Ref Range    Phosphorus 6.3 (H) 2.5 - 4.9 mg/dL        Imaging/Diagnostics Last 24 Hours   CTA LOWER EXTREMITY LEFT W CONTRAST  Result Date: 09/13/2024  CTA LOWER EXTREMITY LEFT W CONTRAST , 09/13/2024 4:36 HISTORY: Arterial occlusion COMPARISON: None CT radiation dose optimization techniques (automated exposure control, use of iterative reconstruction techniques, or adjustment of the mA and/or kV according  to patient size) were used to limit patient  radiation dose. IOPAMIDOL  76 % IV SOLN 75mL Scout topogram: No additional finding 3-D CT angiography technique. Abundant contrast in the urinary bladder. This is from a recent procedure. Enlarged prostate. There is an intra-arterial catheter seen in the common iliac artery bilaterally which appears to terminate in the region of left popliteal artery. Perhaps trace contrast demonstrated within common femoral artery. Otherwise no significant contrast opacification. Calcification of the popliteal artery. There is no runoff to the foot. Heterogeneous appearance of the musculature of the leg both in the thigh and below the knee may be due to regions of ischemic muscle and myonecrosis. Low density within the pectineus muscle and adjacent groin musculature compatible with areas of necrosis. The bones show no significant abnormality IMPRESSION: 1.  Intra-arterial catheter. Occluded arteries to the left lower extremity. Occluded from common femoral artery distally. 2.  Heterogeneous musculature throughout the lower extremity compatible with ischemia and developing myonecrosis Report was called to ICU Darryll) and discussed with nurse Kristin by phone on 09/13/2024 5:01 .  See above for complete and additional details.   Dictated and Electronically Signed By: Fairy Maffucci, MD [facility] 09/13/2024 5:04        Vascular duplex lower extremity arteries left  Result Date: 09/12/2024  VAS DUP LOWER EXTREMITY ARTERIES LEFT EXAM DATE: 09/12/2024 17:35 DEMOGRAPHICS: 63 years old Male INDICATION: Acute limb ischemia COMPARISON: No existing relevant imaging study corresponding to the same anatomical region is available. TECHNIQUE: Left lower extremity Doppler waveforms and velocities were obtained FINDINGS: Left lower extremity: Velocities (cm/s ) and Waveforms: Common femoral artery:  - 22 Monophasic. Profunda femoris:  Not identified. Superficial femoral:  - proximal : 44 The mid and distal segments appear occluded.  Popliteal artery :  The popliteal artery appears occluded. Posterior tibial artery :  The posterior tibial artery appears occluded. Anterior tibial artery :  The anterior tibial artery appears occluded. IMPRESSION: Occluded left lower extremity arterial vasculature from the mid segment of the superficial femoral artery.  Dictated and Electronically Signed By: Glendia Shows, DO [facility] 09/12/2024 23:12    Findings were discussed with the patient's nurse Kristen on 09/12/2024 at 11:12 PM     Echo (TTE) complete (PRN contrast/bubble/strain/3D)  Result Date: 09/12/2024    Image quality is poor, patient screaming in pain during exam.   Left Ventricle: Normal left ventricular systolic function with a visually estimated EF of 55 - 60%. Left ventricle is smaller than normal. Normal wall thickness.  LVIDd is 3.1 cm. Normal wall motion.   Left Atrium: Left atrium is smaller than normal.   Right Ventricle: Normal systolic function.   Aortic Valve: Individual aortic valve leaflets not well visualized.   Pericardium: No pericardial effusion.     Cardiac procedure  Result Date: 09/12/2024  Indication left lower extremity acute  limb ischemia Procedure performed: Abdominal aortogram Renal angiogram Bilateral lower extremities peripheral angiogram  Conscious sedation: IV Versed and IV fentanyl  were given, please refer to log for dosing EBL:  50 cc Description of procedure: After informed consent was obtained the patient was brought to cardiac Cath Lab in a fasting state. Bilateral groins were draped prepped and sterile and usual fashion. Right common femoral artery was accessed via ultrasound with 6 French sheath. On ultrasound the right common femoral artery was noted to be patent, needle was visualized accessing the artery. USG images were saved for medical records This was followed by insertion of pigtail catheter and J-wire. Catheter was then placed in abdominal aorta Distal aortogram was performed which  revealed patent distal aorta bifurcating into left and right common iliac artery.  Renal arteries bilateral were noted be widely patent Anatomy: Left side: Left common iliac artery has 60 to 70% stenosis, left common femoral artery 20 to 80% stenosis, left SFA mid segment 80% stenosis, possible vasospasm, popliteal arteries patent Left anterior tibial artery is occluded proximally Left posterior tibial artery is occluded mid segment No flow noted to the foot Limited angiogram on the right side Right common iliac external and femoral artery are noted to be patent Right patent proximal SFA and profunda Peripheral intervention After reviewing images, It was decided to intervene Command wire was then introduced into the anterior tibial artery Penumbra lightening bolt 6 XT X system was used in the anterior deep, no aspirate was obtained This was followed by the use of penumbra in the posterior tibial artery as well however no aspiration could be obtained. Followed by introduction of tPA catheter into the mid SFA Patient will be heparinized  alongside tPA infusion at 0.5 mg/h for next 18 to 24 hours Will obtain a follow-up angiogram in the morning Dr Bertell ++++++++++++++++++++++++++ Moderate (conscious) sedation was administered under the direct supervision of the performing physician. The patient received intravenous midazolam (Versed) and fentanyl  for sedation and analgesia. Continuous monitoring of heart rate, blood pressure, oxygen saturation, respiratory rate, and level of consciousness was maintained throughout the procedure by qualified personnel under the physicians supervision. Total sedation time: 60 minutes. The patient tolerated the procedure well without immediate complications.     Invasive vascular procedure  Result Date: 09/12/2024  Indication left lower extremity acute  limb ischemia Procedure performed: Abdominal aortogram Renal angiogram Bilateral lower extremities peripheral angiogram  Conscious  sedation: IV Versed and IV fentanyl  were given, please refer to log for dosing EBL:  50 cc Description of procedure: After informed consent was obtained the patient was brought to cardiac Cath Lab in a fasting state. Bilateral groins were draped prepped and sterile and usual fashion. Right common femoral artery was accessed via ultrasound with 6 French sheath. On ultrasound the right common femoral artery was noted to be patent, needle was visualized accessing the artery. USG images were saved for medical records This was followed by insertion of pigtail catheter and J-wire. Catheter was then placed in abdominal aorta Distal aortogram was performed which revealed patent distal aorta bifurcating into left and right common iliac artery.  Renal arteries bilateral were noted be widely patent Anatomy: Left side: Left common iliac artery has 60 to 70% stenosis, left common femoral artery 20 to 80% stenosis, left SFA mid segment 80% stenosis, possible vasospasm, popliteal arteries patent Left anterior tibial artery is occluded proximally Left posterior tibial artery is occluded mid segment No flow noted to the foot Limited angiogram on the right side Right common iliac external and femoral artery are noted to be patent Right patent proximal SFA and profunda Peripheral intervention After reviewing images, It was decided to intervene Command wire was then introduced into the anterior tibial artery Penumbra lightening bolt 6 XT X system was used in the anterior deep, no aspirate was obtained This was followed by the use of penumbra in the posterior tibial artery as well however no aspiration could be obtained. Followed by introduction of tPA catheter into the mid SFA Patient will be heparinized alongside tPA infusion at 0.5 mg/h for next 18 to 24 hours Will obtain a follow-up angiogram in the morning Dr Bertell ++++++++++++++++++++++++++ Moderate (conscious) sedation was administered under the direct supervision of the  performing physician. The patient received intravenous midazolam (Versed) and fentanyl  for sedation and analgesia. Continuous monitoring of heart rate, blood pressure, oxygen saturation, respiratory rate, and level of consciousness was maintained throughout the procedure by qualified personnel under the physicians supervision. Total sedation time: 60 minutes. The patient tolerated the procedure well without immediate complications.     XR CHEST PORTABLE  Result Date: 09/12/2024  PROCEDURE: XR CHEST PORTABLE DATE OF EXAM:  09/12/2024 9:53 DEMOGRAPHICS: 63 years old Male INDICATION: Altered Mental Status COMPARISON: None FINDINGS:  Hyperinflation. No pleural effusion, pneumothorax, or focal consolidation. Cardiac size is within normal limits. No acute osseous abnormality. IMPRESSION:  Hyperinflation without acute airspace disease  Dictated and Electronically Signed By: Jorene Plough, MD [facility] 09/12/2024 9:24        Total critical care time 36 minutes:  Critical care time does not include separately billable procedures    Electronically signed by Devonta Blanford, PA-C on 09/13/2024 at 11:31 AM  "

## 2024-09-13 NOTE — Progress Notes (Signed)
 POC blood glucose 113.

## 2024-09-13 NOTE — Progress Notes (Signed)
"  Left leg is cold and mottled  No perfusion or pulses palpable  On angiogram he has one-vessel flow below knee but has lost left profunda  Discussed with CT./ vascular  surgery  After discussion recommended transfer to higher level of care with vascular surgery support for possible intervention?  Discussed with family updated  Also discussed about very high chances of amputation at this point  Family agreeable to transfer to [facility]  Father reports he had similar event?  Discussed with the critical care team will transfer to [facility] expedite transfer    "

## 2024-09-13 NOTE — Unmapped External Note (Addendum)
 "OP NOTE   09/13/2024  Patient Name: Joe Avery    DOB: January 17, 1961     Log id:  [id number]  Surgery date:  09/13/2024  Surgeon:  Surgeons and Role:     DEWAINE Caroll Agent, MD - Primary        Procedure Details:  Surgical Embolectomy/Thrombectomy Dorsalis Pedal Artery with Bovine Pericardial Patch Angioplasty  Surgical Embolectomy/Thrombectomy Posterior Tibial Artery with Bovine Pericardial Patch Angioplasty  Left Iliac and Profunda Embolectomy   Four Compartment Fasciotomy  Left Leg angiography  Pre-operative Diagnosis:  ARTERIAL THROMBOSIS OF LEFT LEG  RABDOMYOLYSIS  SEPTIC SHOCK  Post-operative Diagnosis:  ARTERIAL THROMBOSIS OF LEFT LEG  RABDOMYOLYSIS  SEPTIC SHOCK  Anesthesia Type:  General     Indication for Procedure: 63 year old male with acute limb ischemia due to arterial embolism s/p catheter directed thrombolysis performed by cardiology at [facility] in [place] complicated by compartment syndrome and rhabdomyolysis. Presents for limb salvage.     Radiological/Operative Findings:  Thrombus in left profunda artery, thrombus in the distal anterior tibial and posterior tibial artery. S/p thrombectomy demonstrated inline flow to the foot from the posterior tibial and dorsalis pedal artery however the dorsalis pedal artery did not have significant outflow and had a very obstructive signal upon completion.       Description of Procedure: Obtained.  Patient was brought back into the hybrid room.  Invasive monitoring lines were placed.  Induction of general anesthesia was achieved.  Patient's bilateral groins and left lower extremity including the previously placed 7 French sheath from an outside hospital was prepped and draped sterilely.  Preoperative IV antibiotics was administered.  I began by proceeding with a 4 compartment fasciotomy as clinically the patient had evidence of compartment syndrome.  I used the ultrasound to identify and mark the saphenous vein to preserve it which appeared to be healthy and  normal.  I made a medial and lateral incision for total distance of about 12 cm.  From the medial side I was able to open up the superficial posterior followed by the deep posterior fascia.  The muscle immediately bulged out due to increase intra compartment pressure.  The muscle appeared to be relatively viable although the deep was somewhat pale.  Movement was not able to be obtained as the patient received paralytic.  I used scissors to extend the fascial incision proximally and distally down the entire leg.  I then opened the lateral compartment with use electrocautery followed by scissors proximally and distally.  I then identified the anterior compartment and sized it with the Bovie and extended it posteriorly distally with scissors.  Once again the muscle immediately bulged out consistent with intracompartmental elevated pressure.  The muscle also appeared to be viable and not necrotic.  I then proceeded to perform a left leg angiography.  The outside 7 French sheath was exchanged out over a guidewire for another clean 7 French sheath.  Omni Flush catheter and Glidewire was used to perform a CO2 abdominal aortogram.  This demonstrated patency of the infrarenal aorta without any evidence of thrombus or stenosis in the bilateral iliac arteries.  Aortic bifurcation was crossed and the catheter was advanced to the left distal superficial femoral artery.  Distal left leg angiography was performed.  Initially there appeared to be patency of the popliteal artery and two-vessel runoff to an atretic peroneal and anterior tibial artery.  I then used a Rubicon catheter over 3 five 150 cm to gain access into the anterior  tibial artery and performed a distal anterior tibial artery angiography.  This demonstrated abrupt occlusion in the distal anterior tibial artery concerning for thrombus.  At this point I felt the patient would benefit most from undergoing tibial artery cutdown and thrombectomy with bovine pericardial  patch angioplasty.  I used ultrasound to mark and identify the left dorsalis pedal and posterior tibial artery.  A dorsal medial malleolus incision was made with a 15 blade and dissection was carried down to the dorsalis pedal artery which appeared to be large and for thrombus.  The posterior tibial artery did not appear to have thrombus and was somewhat smaller.  I made a longitudinal arteriotomy with 11 blade over the dorsalis pedal artery and extracted out acute thrombus.  I used a 2 Fogarty for a total distance of 5 to 7 cm and performed distal thrombectomy of the dorsalis pedal and tarsal arteries.  Copious amount of thrombus in C-reactive out but no backbleeding was achieved.  I then ran a 2 followed by a 3 Fogarty proximally all the way up to a total distance of 50 cm and performed thrombectomy and extracted out thrombus until pulsatile inflow was achieved.  Flow was arrested with use of Vesseloops.  The longitudinal arteriotomy was then repaired using a portion of a bovine pericardial patch utilizing 7-0 Prolene suture.  I then focused my attention to the posterior tibial artery.  Once again I made a longitudinal arteriotomy with 11 blade and extended with use of Pat scissors.  I used a 2 Fogarty distally into the plantar artery and performed surgical embolectomy.  A small portion of organized thrombus extracted out.  I did get some backbleeding.  I then ran the 2 Fogarty proximally for total distance of 50 cm and performed surgical embolectomy and some small fragments of fibrinous thrombus was retracted up until postop and fluids gave.  The posterior tibial artery was then repaired using a portion of a Berman pericardial patch using 7-0 Prolene suture.  Flow was restored.  There was a Doppler signal present over the posterior tibial artery that was biphasic.  There was a Doppler signal over the dorsalis pedal artery but this was somewhat obstructive.  Elected to perform completion angiography.  I then  performed contrast angiography through the Rubicon catheter which I selectively access the anterior tibial artery.  This demonstrated rapid filling of contrast down the anterior tibial to the dorsalis pedal artery into the medial tarsal artery but there was very poor filling of the digital arteries and pedal loop.  The contrast did eventually clear out.  I do not see any evidence of thrombus or stenosis present and I felt that I could no longer do anything further in regards to the anterior circulation of the foot.  I then withdrew the Rubicon catheter to the popliteal artery and performed a left leg angiogram to interrogate the posterior tibial artery.  It was somewhat vasospastic but otherwise patent with inline flow to the medial and lateral plantar arteries.  Happy with the results devices removed out and 6 French Angio-Seal was then used to seal the right femoral arteriotomy.  I then elected to perform surgical embolectomy of the profunda artery in case the patient ultimately would require a left leg amputation.  I made a longitudinal arteriotomy over the femoral arterial system.  The common femoral artery profunda and superficial femoral artery was then encircled you have a Vesseloops and flow was arrested.  A transverse arteriotomy was made with 11 blade  and extended with use of Potts scissors.  I ran a 4 Fogarty proximally into the iliac artery to ensure there was no residual evidence of thrombus which there was not any.  I then ran a 4 Fogarty for a total distance of about 5 to 10 cm in the profunda artery and performed thrombectomy and extracted out some organized fibrinous thrombus.  I did encounter some backbleeding.  I flushed the entire system with heparinized saline.  The transverse arteriotomy was then closed with a 5-0 Prolene suture.  Flow was restored.  The maintain a great Doppler signal over the posterior tibial artery and improved but obstructive Doppler signal over the dorsalis pedal artery.   There was a good Doppler signal over the profunda and superficial femoral artery.  Therapeutic heparin  was administered prior to intervention.  I elected not to reverse the patient with protamine.  The patient will undergo 520 units an hour of heparin  for now.  Fibrillar was applied over the left femoral artery and the incision was closed using multiple layers with a 3-0 Vicryl followed by a 4 Monocryl suture in a running horizontal mattress fashion.  The incisions over the dorsal foot and medial malleolus was closed with a running 4 Monocryl suture in a horizontal mattress fashion.  The fasciotomy incisions were then dressed with 2 ABD pads followed by rolled gauze and a 4 inch Ace wrap.  The patient remained acidotic and required pressors most likely related to hypovolemic shock and possibly distributive shock related to a systemic inflammatory response syndrome.  The patient remained intubated and sent to intensive care unit for ongoing resuscitation.  She had minimal urine output consistent with rhabdomyolysis.  He remains in critical but stable condition.  Estimated Blood Loss: 200 ml    Specimens:  * No specimens in log *  Complications: None  Disposition: to ICU intubated. Hgb intra-op was 6, received a total of 1 pRBC. Patient will requier 2 additional units on the floor. A-line and CVC placed by me and anesthesia. Very low urine output. 5mL of contrast given, rest wa sperformed with Co2. Patient did have compartment syndrome. Will keep patien tat 500 units/hr  Electronically signed by: Lynwood Keeler, MD, 09/13/2024 11:26 PM   "

## 2024-09-13 NOTE — Progress Notes (Signed)
"  POC blood glucose 203    "

## 2024-09-13 NOTE — Discharge Summary (Signed)
 "    V2.0  Discharge Summary    Name:  Joe Avery DOB/Age/Sex: Jun 06, 1961 (63 y.o. male)   Admit Date: 09/12/2024  Discharge Date: 09/13/24    MRN & CSN:  [id number] & [id number] Encounter Date and Time 09/13/24 4:41 PM EST    Attending:  Argyle Dallas CHRISTELLA Mickey., * Discharging Provider: Cyrus Ladd, [facility] Course:     Brief HPI: Zebbie Ace is a 63 y.o. male  with significant PMH of severe peripheral vascular disease, HTN, CKD stage III A, poorly controlled DM-2, who presented with severe pain left lower extremity, poor appetite for the last few days, lab workup was significant for high anion gap metabolic acidosis, DKA, imaging study of left lower extremity CTA with contrast showed occluded arteries from the common femoral artery distally, heterogenous musculature throughout the lower extremity compatible with ischemia and developing myonecrosis.  Clinically and nondopplerable left lower extremity pulses, cold LL extremity.  Patient was started on heparin  drip, cardiology was consulted, underwent emergent peripheral angiogram on 11/3-noted complete left SFA occlusion, continued heparin  and tPA infusion via left sheath, admitted to ICU for further management.  Started on insulin  drip per DKA protocol, with fluid and electrolyte replacements and every 4 hour lab checks  Patient underwent successful mechanical thrombectomy of left iliac artery left SFA left common femoral with successful angioplasty of the left iliac artery and left SFA left popliteal and ATA.  Postprocedure patient was still complaining of left leg pain, no dopplerable pulses, sensation was intact.  Lab workup significant for worsening AKI with oliguria, hyperkalemia, hyperphosphatemia, metabolic acidosis, CK level > 77,999 with myonecrosis as shown on imaging study.  Fluid resuscitation, hyper-K protocol, every 4 hour lab checks, patient was off insulin  drip this morning, glucose levels elevated this afternoon > 400, restarted insulin   drip, started on bicarb drip for AKI.  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  And shock state, started on Levophed , added vasopressin  and stress dose steroids.  Given nonpalpable or nondopplerable pulses postprocedure and left leg is still cold and mottled cardiologist recommended to transfer the patient to  [facility] for vascular surgery evaluation and possible intervention.  Patient was made aware that there is a high chance of low left lower extremity amputation.  Initiated transfer and patient was accepted at [facility]. Accepting Physician Dr. Lucienne to ICU.    Brief Problem Based Course:     Left lower extremity ischemia ( occluded from left common femoral distally) s/p intravascular thrombolysis 11/04  AKI on CKD III, possibly s/t limb ischemia, myonecrosis, Rhabdomyolysis  Rhabdomyolysis (myonecrosis left lower extremity)  Hyperkalemia, metabolic acidosis, hyperphosphatemia in the setting of AKI  PVD  Lactic acidosis   DKA  DM II, poorly controlled   HTN  Severe protein energy malnutrition    The patient expressed appropriate understanding of, and agreement with the discharge recommendations, medications, and plan.     Consults this admission:  IP CONSULT TO ENDOCRINOLOGY  IP CONSULT TO UROLOGY    Discharge Diagnosis:   Acute lower limb ischemia  Septic shock      Discharge Instruction:   Follow up appointments: Cardiology  Primary care physician: Fernand Ruffing, MD within 2 weeks  Diet: NPO   Activity: bedrest  Disposition: Discharged to:   [] Home, [] HHC, [] SNF, [] Acute Rehab, [] [facility]  Condition on discharge: Critically ill on 2 vasopressors  Labs and Tests to be Followed up as an outpatient by PCP or Specialist:     Discharge Medications:        Medication List        ASK your doctor about these medications      amLODIPine  10 MG tablet  Commonly known as: NORVASC       aspirin  81 MG chewable tablet  Take 1 tablet by mouth daily     atorvastatin  40 MG tablet  Commonly known as: LIPITOR   Take 1 tablet by mouth nightly     EXCEDRIN PO     gabapentin  100 MG capsule  Commonly known as: NEURONTIN   Take 1 capsule by mouth in the morning and at bedtime.     lisinopril  40 MG tablet  Commonly known as: PRINIVIL ;ZESTRIL      metFORMIN 500 MG tablet  Commonly known as: GLUCOPHAGE     RANITIDINE 150 MAX STRENGTH PO             Objective Findings at Discharge:   BP 101/70   Pulse (!) 108   Temp 98 F (36.7 C) (Oral)   Resp 22   Ht 1.753 m (5' 9)   Wt 50.9 kg (112 lb 3.4 oz)   SpO2 (!) 83%   BMI 16.57 kg/m       Physical Exam:   General: Patient is critically ill-appearing 63 year old male  Eyes: EOMI  ENT: neck supple, hearing is intact  Cardiovascular: Sinus tachycardia   respiratory: Clear to auscultation, no wheeze, rales, rhonchi  Gastrointestinal: Soft, non tender  Genitourinary: no suprapubic tenderness  Musculoskeletal: No edema, cyanotic discoloration with lacy mottling left lower extremity nonpalpable pulses/not dopplerable pulses, cold to touch  Skin: warm, dry  Neuro: Lethargic, oriented x 3, no lateralizing deficits, sensation intact in all 4 extremities, no motor strength LLE  Psych: Mood appropriate.         Labs and Imaging   Invasive vascular procedure  Addendum Date: 09/13/2024  Successful thrombectomy of left iliac artery left SFA left common femoral artery left brachial artery and successful angioplasty of left iliac artery, left SFA left popliteal and ATA     Result Date: 09/13/2024  Successful thrombectomy of left iliac artery left SFA left common femoral artery left brachial artery and successful angioplasty of left iliac artery, left SFA left popliteal and ATA     CTA LOWER EXTREMITY LEFT W CONTRAST  Result Date: 09/13/2024  CTA LOWER EXTREMITY LEFT W CONTRAST , 09/13/2024 4:36 HISTORY: Arterial occlusion COMPARISON: None CT radiation dose optimization techniques  (automated exposure control, use of iterative reconstruction techniques, or adjustment of the mA and/or kV according  to patient size) were used to limit patient radiation dose. IOPAMIDOL  76 % IV SOLN 75mL Scout topogram: No additional finding 3-D CT angiography technique. Abundant contrast in the urinary bladder. This is from a recent procedure. Enlarged prostate. There is an intra-arterial catheter seen in the common iliac artery bilaterally which appears to terminate in the region of left popliteal artery. Perhaps trace contrast demonstrated within common femoral artery. Otherwise no significant contrast opacification. Calcification of the popliteal artery. There is no runoff to the foot. Heterogeneous appearance of the musculature of the leg both in the thigh and below the knee may be due to regions of ischemic muscle and myonecrosis. Low density within the pectineus muscle and adjacent groin musculature compatible with areas of necrosis. The bones show no significant abnormality IMPRESSION: 1.  Intra-arterial catheter. Occluded arteries to the left  lower extremity. Occluded from common femoral artery distally. 2.  Heterogeneous musculature throughout the lower extremity compatible with ischemia and developing myonecrosis Report was called to ICU Darryll) and discussed with nurse Kristin by phone on 09/13/2024 5:01 . See above for complete and additional details.   Dictated and Electronically Signed By: Fairy Maffucci, MD [facility] 09/13/2024 5:04        Vascular duplex lower extremity arteries left  Result Date: 09/12/2024  VAS DUP LOWER EXTREMITY ARTERIES LEFT EXAM DATE: 09/12/2024 17:35 DEMOGRAPHICS: 63 years old Male INDICATION: Acute limb ischemia COMPARISON: No existing relevant imaging study corresponding to the same anatomical region is available. TECHNIQUE: Left lower extremity Doppler waveforms and velocities were obtained FINDINGS: Left lower extremity: Velocities (cm/s ) and Waveforms:  Common femoral artery:  - 22 Monophasic. Profunda femoris:  Not identified. Superficial femoral:  - proximal : 44 The mid and distal segments appear occluded. Popliteal artery :  The popliteal artery appears occluded. Posterior tibial artery :  The posterior tibial artery appears occluded. Anterior tibial artery :  The anterior tibial artery appears occluded. IMPRESSION: Occluded left lower extremity arterial vasculature from the mid segment of the superficial femoral artery.  Dictated and Electronically Signed By: Glendia Shows, DO [facility] 09/12/2024 23:12    Findings were discussed with the patient's nurse Kristen on 09/12/2024 at 11:12 PM     Echo (TTE) complete (PRN contrast/bubble/strain/3D)  Result Date: 09/12/2024    Image quality is poor, patient screaming in pain during exam.   Left Ventricle: Normal left ventricular systolic function with a visually estimated EF of 55 - 60%. Left ventricle is smaller than normal. Normal wall thickness.  LVIDd is 3.1 cm. Normal wall motion.   Left Atrium: Left atrium is smaller than normal.   Right Ventricle: Normal systolic function.   Aortic Valve: Individual aortic valve leaflets not well visualized.   Pericardium: No pericardial effusion.     Cardiac procedure  Result Date: 09/12/2024  Indication left lower extremity acute  limb ischemia Procedure performed: Abdominal aortogram Renal angiogram Bilateral lower extremities peripheral angiogram  Conscious sedation: IV Versed and IV fentanyl  were given, please refer to log for dosing EBL:  50 cc Description of procedure: After informed consent was obtained the patient was brought to cardiac Cath Lab in a fasting state. Bilateral groins were draped prepped and sterile and usual fashion. Right common femoral artery was accessed via ultrasound with 6 French sheath. On ultrasound the right common femoral artery was noted to be patent, needle was visualized accessing the artery. USG images were saved for  medical records This was followed by insertion of pigtail catheter and J-wire. Catheter was then placed in abdominal aorta Distal aortogram was performed which revealed patent distal aorta bifurcating into left and right common iliac artery.  Renal arteries bilateral were noted be widely patent Anatomy: Left side: Left common iliac artery has 60 to 70% stenosis, left common femoral artery 20 to 80% stenosis, left SFA mid segment 80% stenosis, possible vasospasm, popliteal arteries patent Left anterior tibial artery is occluded proximally Left posterior tibial artery is occluded mid segment No flow noted to the foot Limited angiogram on the right side Right common iliac external and femoral artery are noted to be patent Right patent proximal SFA and profunda Peripheral intervention After reviewing images, It was decided to intervene Command wire was then introduced into the anterior tibial artery Penumbra lightening bolt 6 XT X system was used in the anterior deep,  no aspirate was obtained This was followed by the use of penumbra in the posterior tibial artery as well however no aspiration could be obtained. Followed by introduction of tPA catheter into the mid SFA Patient will be heparinized alongside tPA infusion at 0.5 mg/h for next 18 to 24 hours Will obtain a follow-up angiogram in the morning Dr Bertell ++++++++++++++++++++++++++ Moderate (conscious) sedation was administered under the direct supervision of the performing physician. The patient received intravenous midazolam (Versed) and fentanyl  for sedation and analgesia. Continuous monitoring of heart rate, blood pressure, oxygen saturation, respiratory rate, and level of consciousness was maintained throughout the procedure by qualified personnel under the physicians supervision. Total sedation time: 60 minutes. The patient tolerated the procedure well without immediate complications.     Invasive vascular procedure  Result Date: 09/12/2024  Indication left  lower extremity acute  limb ischemia Procedure performed: Abdominal aortogram Renal angiogram Bilateral lower extremities peripheral angiogram  Conscious sedation: IV Versed and IV fentanyl  were given, please refer to log for dosing EBL:  50 cc Description of procedure: After informed consent was obtained the patient was brought to cardiac Cath Lab in a fasting state. Bilateral groins were draped prepped and sterile and usual fashion. Right common femoral artery was accessed via ultrasound with 6 French sheath. On ultrasound the right common femoral artery was noted to be patent, needle was visualized accessing the artery. USG images were saved for medical records This was followed by insertion of pigtail catheter and J-wire. Catheter was then placed in abdominal aorta Distal aortogram was performed which revealed patent distal aorta bifurcating into left and right common iliac artery.  Renal arteries bilateral were noted be widely patent Anatomy: Left side: Left common iliac artery has 60 to 70% stenosis, left common femoral artery 20 to 80% stenosis, left SFA mid segment 80% stenosis, possible vasospasm, popliteal arteries patent Left anterior tibial artery is occluded proximally Left posterior tibial artery is occluded mid segment No flow noted to the foot Limited angiogram on the right side Right common iliac external and femoral artery are noted to be patent Right patent proximal SFA and profunda Peripheral intervention After reviewing images, It was decided to intervene Command wire was then introduced into the anterior tibial artery Penumbra lightening bolt 6 XT X system was used in the anterior deep, no aspirate was obtained This was followed by the use of penumbra in the posterior tibial artery as well however no aspiration could be obtained. Followed by introduction of tPA catheter into the mid SFA Patient will be heparinized alongside tPA infusion at 0.5 mg/h for next 18 to 24 hours Will obtain a  follow-up angiogram in the morning Dr Bertell ++++++++++++++++++++++++++ Moderate (conscious) sedation was administered under the direct supervision of the performing physician. The patient received intravenous midazolam (Versed) and fentanyl  for sedation and analgesia. Continuous monitoring of heart rate, blood pressure, oxygen saturation, respiratory rate, and level of consciousness was maintained throughout the procedure by qualified personnel under the physicians supervision. Total sedation time: 60 minutes. The patient tolerated the procedure well without immediate complications.     XR CHEST PORTABLE  Result Date: 09/12/2024  PROCEDURE: XR CHEST PORTABLE DATE OF EXAM:  09/12/2024 9:53 DEMOGRAPHICS: 63 years old Male INDICATION: Altered Mental Status COMPARISON: None FINDINGS:  Hyperinflation. No pleural effusion, pneumothorax, or focal consolidation. Cardiac size is within normal limits. No acute osseous abnormality. IMPRESSION:  Hyperinflation without acute airspace disease  Dictated and Electronically Signed By: Jorene Plough, MD [facility]  09/12/2024 9:24          CBC:   Recent Labs     09/12/24  1950 09/13/24  0205 09/13/24  1035 09/13/24  1342   WBC 17.6* 12.2* 13.5* 12.1*   HGB 13.1* 12.7* 10.8* 9.8*   PLT 230 204 175  --      BMP:    Recent Labs     09/13/24  0355 09/13/24  1035 09/13/24  1342   NA 138 135* 136   K 5.1 6.9* 5.8*   CL 106 108 105   CO2 21 15* 12*   BUN 54* 52* 55*   CREATININE 1.7* 2.1* 2.4*   GLUCOSE 171* 296* 473*     Hepatic:   Recent Labs     09/12/24  0900   AST 20   ALT 14   BILITOT 0.5   ALKPHOS 135*     Lipids:   Lab Results   Component Value Date/Time    CHOL 127 12/13/2023 05:01 AM    HDL 38 12/13/2023 05:01 AM    TRIG 86 12/13/2023 05:01 AM     Hemoglobin A1C:   Lab Results   Component Value Date/Time    LABA1C 11.7 09/12/2024 09:00 AM     TSH:   Lab Results   Component Value Date/Time    TSH 0.46 09/12/2024 09:00 AM     Troponin: No results found for:  TROPONINT  Lactic Acid:   Recent Labs     09/13/24  0015 09/13/24  0620 09/13/24  1035   LACTA 1.9 1.0 3.5*     BNP: No results for input(s): PROBNP in the last 72 hours.  UA:  Lab Results   Component Value Date/Time    NITRU Negative 05/05/2024 10:29 AM    COLORU Yellow 05/05/2024 10:29 AM    PHUR 6.0 05/05/2024 10:29 AM    LABCAST None seen 01/04/2024 10:05 AM    RBCUA 3-10 01/04/2024 10:05 AM    BACTERIA None seen 01/04/2024 10:05 AM    CLARITYU Clear 05/05/2024 10:29 AM    LEUKOCYTESUR Negative 05/05/2024 10:29 AM    UROBILINOGEN 0.2 05/05/2024 10:29 AM    BILIRUBINUR Negative 05/05/2024 10:29 AM    BLOODU Negative 05/05/2024 10:29 AM    GLUCOSEU Negative 05/05/2024 10:29 AM    KETUA Negative 05/05/2024 10:29 AM     Urine Cultures: No results found for: LABURIN  Blood Cultures: No results found for: BC  No results found for: BLOODCULT2  Organism: No results found for: ORG    Time Spent Discharging patient 35 minutes    Electronically signed by Launa Goedken, PA-C on 09/13/2024 at 4:41 PM  "

## 2024-09-13 NOTE — Progress Notes (Addendum)
"  Dr. Gurney informed of lack of UOP from pt in last three hours and diminishing hourly urine since 0200. Order for foley.     1230 - Anu, PA notified pt awaiting speech eval. Order for lokelma  DC and IV insulin  hyperkalemia protocol ordered.     1335 - Dr. Merrianne at bedside. MD notified pt awaiting speech eval prior to oral medication being administered d/t difficulty swallowing. Order to hold oral loading dose of plavix  and start integrilin  gtt until pt cleared by speech.     1600 - Medford Barefoot notified of pt confirmation for transfer. Order to ocntinue to hold plavix  and transfer on integrinin gtt    1630 - Anu, PA notified of k, BG and AntiXa - see order for insuling gttt      "

## 2024-09-13 NOTE — Progress Notes (Signed)
"  Case discussed with Dr. Sheilda at [facility] and patient will be transferred to [facility].  "

## 2024-09-13 NOTE — Progress Notes (Signed)
 "SLP Therapy  Facility/Department: [facility] ICU   CLINICAL BEDSIDE SWALLOW EVALUATION    NAME: Joe Avery  DOB: 1961/06/10  MRN: [id number]    ADMISSION DATE: 09/12/2024  ADMITTING DIAGNOSIS: has Chronic kidney disease, stage III (moderate) (HCC); Primary hypertension; Low vitamin D  level; Type 2 diabetes mellitus without complication, without long-term current use of insulin  (HCC); Persistent proteinuria; Stroke-like symptoms; Primary insomnia; Chronic kidney disease, stage II (mild); Acute lower limb ischemia; and Critical limb ischemia of left lower extremity (HCC) on their problem list.      IMPRESSIONS: Joe Avery was seen for a bedside swallow evaluation following admission to [facility] for severe left leg pain. Pt reports ongoing difficulty with thin liquids feeling like they go down into his lung but states that he has had no difficulties with solid food. Pt reports that he has not eaten much for the last 2 weeks. Pertinent medical history includes  DM, CKD stg III, HTN, PVD, COPD.    Pt was positioned in reverse trendelenburg position for evaluation and unable to elevate HOB further. Weak vocal quality and volitional cough. Oral mechanism exam revealed no focal orofacial deficits but generalized weakness. Natural teeth present in poor condition. PO trials of ice chips, thin liquids via straw, and puree were given. Oral deficits characterized by decreased stripping from spoon, prolonged/disorganized manipulation, slow ap transit, and mild, diffuse oral residue post-swallow. Immediate cough following initial ice chip trial with no other overt s/s of aspiration with any consistencies. Pt is noted to grimace and slap chest with hand during swallow initiation at start of PO trials. Pt denies odynophagia but states that it feels like it's moving slow. Both grimacing and chest slapping decreased as trials progressed until they stopped altogether.     Recommend pt remain NPO with medications in puree with small, single  sips for comfort. Medications either whole/crushed in puree. Strict aspiration precautions. RN notified. Pt educated on recommendations. SLP will continue to follow.       Date of Eval: 09/13/2024    Evaluating Therapist: Allean Husk, SLP    Recommended Diet and Intervention  Solid Consistency Recommendation: NPO  Liquid Consistency Recommendation: NPO  Recommended Form of Meds: Meds in puree  Recommendations: Dysphagia treatment;Modified barium swallow study     Compensatory Swallowing Strategies  Upright as possible for all oral intake;Eat/Feed slowly    Recent Chest Xray/CT of Chest:   Results for orders placed during the hospital encounter of 09/12/24    XR CHEST PORTABLE    Narrative  PROCEDURE: XR CHEST PORTABLE    DATE OF EXAM:  09/12/2024 9:53    DEMOGRAPHICS: 63 years old Male    INDICATION: Altered Mental Status    COMPARISON: None    FINDINGS:  Hyperinflation. No pleural effusion, pneumothorax, or focal consolidation.  Cardiac size is within normal limits. No acute osseous abnormality.    Impression  Hyperinflation without acute airspace disease    Reason for Referral  Joe Avery was referred for a bedside swallow evaluation to assess the efficiency of his swallow function, identify signs and symptoms of aspiration and make recommendations regarding safe dietary consistencies, effective compensatory strategies, and safe eating environment.      Current Diet level:  Current Diet : NPO  Current Liquid Diet : NPO    Pain:  Patient c/o pain (3/10 in chest/abdomin)  Pain Level: 10     Pain Location: Pelvis, Hip, Leg  Pain Orientation: Left  Pain Type: Acute pain  Pain Descriptors: Aching, Pounding,  Shooting  Non-Pharmaceutical Pain Intervention(s): Emotional support  Response to Pain Intervention: Patient satisfied, Progressing toward pain score goal    Impression  Dysphagia Diagnosis: Mild oral stage dysphagia;Suspected needs further assessment  Dysphagia Outcome Severity Scale: Level 1: Severe  dysphagia- NPO. Unable to tolerate any PO safely     Treatment Plan  Requires SLP Intervention: Yes  D/C Recommendations: Ongoing speech therapy is recommended during this hospitalization;To be determined    Treatment Goals  Short Term Goals  Time Frame for Short Term Goals: LOS/Until goals are met  Goal 1: Pt will participate in therapeutic PO trials with SLP team to determine appropriate PO diet  Goal 2: Pt/caregivers will demonstrate comprehension of POC/Recommendations    General  Subjective  Subjective: Pt lethargic but responisve, agreeable to trials  Behavior/Cognition: Cooperative;Pleasant mood;Lethargic  Respiratory Status: Room air  O2 Device: None (Room air)  Communication Observation: Functional  Follows Directions: Simple  Prior Dysphagia History: None    Vision/Hearing  Vision: Within Functional Limits  Hearing: Within Functional Limits    Oral Motor  Oral Motor: Within Functional Limits  Oral Hygiene: Dried secretions  Dentition: Natural;Poor    Oral/ Pharyngeal Phase    Oral Phase: Impaired  PO Trials  Consistency Presented: Pureed;Thin;Ice Chips  How Presented: Straw;Spoon;SLP-fed/Presented    Prognosis  Prognosis Considerations: Severity of Impairments;Medical Diagnosis;Co-Morbidities  Consulted and agree with results and recommendations: Patient;RN  RN Name: Neo    Education  Education Topic: Results/ recommendations of diagnostic testing;Role of SLP  Education Method: Verbal  Patient Education Response: Verbalizes understanding       Therapy Time     Individual Concurrent Group Co-treatment   Time In 1335         Time Out 1355         Minutes [address] [place]  09/13/2024 2:14 PM    "

## 2024-09-13 NOTE — Progress Notes (Signed)
"  [facility]  [facility]     BRIDGE NOTE    09/13/2024  Patient Name: Joe Avery  DOB: 10-30-1961     PRE-PROCEDURE ATTESTATION STATEMENT  I have explained the potential risks, benefits, and side effects of the proposed procedure/treatment, including the risk of death (if appropriate) to the patient and/or surrogate. Also, the possibility for the transfusion of blood or blood components (only if potential for transfusion is applicable) was discussed with risks, benefits, and alternatives.   This explanation included discussion of the likelihood of the patient achieving his or her goals and any potential problems that might occur during recuperation. Reasonable alternatives to the proposed procedure/treatment including risks, benefits, and side effects were also discussed, as were the risks related to not receiving the proposed procedure/treatment.  The patient and/or surrogate have elected to proceed with the proposed procedure/treatment.    In addition to the risks, benefits, alternatives and expected outcomes, I also discussed the risk of contracting COVID-19 with this patient, as well as the potential impact COVID-19 may have on the post-operative recovery process.  Patient understands these additional risks and has had an opportunity to ask questions.  After considering all information and patient's current condition, patient has elected to move forward with the procedure and, in my clinical opinion, I agree it is in his/her best interest at this time.      Procedure: Level One emergent left lower extremity angiogram, possible angioplasty, possible atherectomy, possible stent, possible thrombectomy, possible 4 compartment fasciotomy, and all other indicated procedures  with Dr. Caroll on 09/13/2024.     Bridge Note:  Upon examination of the patient and review of the current History and Physical for this patient, the patient's condition did not change since the current History and Physical was  completed.    Changes include: none     Nena MARLA Belling, APRN,   09/13/2024 8:02 PM   [facility]  Use [facility] to secure chat APP on call       "

## 2024-09-13 NOTE — Progress Notes (Addendum)
 "                                                                               Cardiology Progress Note     Admit Date:  09/12/2024    Consult reason/ Seen today for :       Subjective and  Overnight Events : Overnight continued to have tPA infusion left leg is cold he is unable to move it flex or extend at he has no sensation          Chief complain on admission : 63 y.o.year old who is admitted for  Chief Complaint   Patient presents with    Fatigue     Reports has been feeling weak, unable to eat for couple of weeks       Assessment / Plan:  Will plan for angiogram urgently today Cath Lab activated increase tPA infusion rate  Overall I am very concerned prognosis guarded  Discussed with CT surgery any role for surgical revascularization?  But we may be too far late  Echo shows EF 55-60%   Diabetes management as per primary team  Embolic event should be screened for A-fib  HTN: stable, continue To titrate up medication as needed  DVT Prophylaxis if no contraindication  Due to the immediate potential for life-threatening deterioration due to critical limb ischemia, I spent 42 minutes providing critical care.  This time is excluding time spent performing procedures.    Discussed with primary team, hospitalist service, bedside nursing staff and family  Past medical history:    has a past medical history of Hypertension and Type 2 diabetes mellitus without complication, without long-term current use of insulin  (HCC).  Past surgical history:   has a past surgical history that includes Colonoscopy (N/A, 02/23/2024).  Social History:   reports that he has been smoking cigarettes. He started smoking about 44 years ago. He has a 21.7 pack-year smoking history. He has never used smokeless tobacco. He reports that he does not currently use drugs. He reports that he does not drink alcohol.  Family history:  family history includes Diabetes in his father.    No Known Allergies    Review of Systems:    All 14 systems were  reviewed and are negative  Except for the positive findings  which as documented     BP 125/86   Pulse 100   Temp 98 F (36.7 C) (Oral)   Resp 16   Ht 1.753 m (5' 9)   Wt 50.9 kg (112 lb 3.4 oz)   SpO2 97%   BMI 16.57 kg/m     Intake/Output Summary (Last 24 hours) at 09/13/2024 9191  Last data filed at 09/13/2024 9262  Gross per 24 hour   Intake 2725.96 ml   Output 515 ml   Net 2210.96 ml     Physical Exam:  Constitutional:  Well developed, Well nourished, No acute distress, Non-toxic appearance.   HENT:  Normocephalic, Atraumatic, Bilateral external ears normal, Oropharynx moist, No oral exudates, Nose normal. Neck-  Supple, No stridor.   Eyes:  PERRL, EOMI, Conjunctiva normal, No discharge.   Respiratory:  Normal breath sounds, No respiratory distress, No wheezing,  No chest tenderness.   Cardiovascular:  Normal heart rate, Normal rhythm, No murmurs, No rubs, No gallops, JVP not elevated  Abdomen/GI:  Bowel sounds normal, Soft, No tenderness, No masses, No pulsatile masses.     Musculoskeletal: Left leg is mottled cold unable to flex or extend  Integument: Left leg is cold  Lymphatic:  No lymphadenopathy noted.   Neurologic:  Alert & oriented x 3, Normal motor function, Normal sensory function, No focal deficits noted.   Psychiatric:  Affect  and  Mood :no change    Medications:    sodium chloride  flush  5-40 mL IntraVENous 2 times per day    atorvastatin   40 mg Oral Nightly    aspirin   81 mg Oral Daily    amLODIPine   10 mg Oral Daily    gabapentin   100 mg Oral BID      sodium chloride       dextrose  5% and 0.45% NaCl with KCl 20 mEq 150 mL/hr at 09/12/24 2023    sodium chloride  250 mL/hr at 09/13/24 0506    insulin  8 Units/hr (09/13/24 0737)    sodium chloride  20 mL/hr at 09/13/24 0737    heparin  (PORCINE) Infusion 500 Units/hr (09/13/24 0737)    ALTEplase  (CATHFLO) 10 mg in sodium chloride  0.9 % 100 mL infusion 1 mg/hr (09/13/24 0606)     sodium chloride  flush, sodium chloride , polyethylene glycol,  [DISCONTINUED] acetaminophen  **OR** acetaminophen , dextrose  bolus **OR** dextrose  bolus, potassium chloride , magnesium  sulfate, sodium phosphate  15 mmol in sodium chloride  0.9 % 250 mL IVPB, dextrose  5% and 0.45% NaCl with KCl 20 mEq, senna, acetaminophen , ondansetron  **OR** ondansetron , sodium chloride , HYDROmorphone , hydrALAZINE     Lab Data:  CBC:   Recent Labs     09/12/24  1453 09/12/24  1950 09/13/24  0205   WBC 17.0* 17.6* 12.2*   HGB 13.7 13.1* 12.7*   HCT 39.9* 37.5* 37.6*   MCV 84.2 83.0 85.8   PLT 239 230 204     BMP:   Recent Labs     09/12/24  1950 09/13/24  0015 09/13/24  0355   NA 147* 135* 138   K 4.0 6.3* 5.1   CL 109 106 106   CO2 24 20* 21   PHOS 3.8 3.5 4.2   BUN 52* 46* 54*   CREATININE 1.6* 1.5* 1.7*     PT/INR:   Recent Labs     09/12/24  0900   PROTIME 15.5*   INR 1.2     BNP:  No results for input(s): PROBNP in the last 72 hours.  TROPONIN: No results for input(s): TROPONINT in the last 72 hours.     ECHO : (interpreted by myself)  echocardiogram     Assessment:  63 y.o.year old who is admitted for  Chief Complaint   Patient presents with    Fatigue     Reports has been feeling weak, unable to eat for couple of weeks     , active issues as noted below:  Impression:  Principal Problem:    Acute lower limb ischemia  Active Problems:    Critical limb ischemia of left lower extremity (HCC)  Resolved Problems:    * No resolved hospital problems. *        Medical Decision Making:  The following items were considered in medical decision making:  Discussion of patient care with other providers  Reviewed clinical lab tests if any  Reviewed radiology tests if any  Reviewed other diagnostic tests/interventions  Independent review of radiologic images if any       Estimated time spent for medical decision-making encompassing complexity of the case, history taking, medication review, physical examination, communication with family, RN, case manager, discussion with primary team , and ancillary staff  members to provide accurate care for the patient      All labs, medications and tests reviewed by myself , continue all other medications of all above medical condition listed as is except for changes mentioned above.    Thank you very much for consult , please call with questions.    Paulita Cosette Noble, MD, MD 09/13/2024 8:08 AM     The above note is prepared with the intention to serve as communication with trained medical care practitioners only. Some of the information is provided by secondary sources as well as from our patient and/or family member(s) recollection, thus inaccuracies may occur. In addition, other professionals integrate data into the electronic medical record, and the [facility] Team MD and NPs are not responsible for these. Any errors will be corrected upon verification with documented reports.   "

## 2024-09-13 NOTE — Progress Notes (Signed)
"      [address]   [place], [place]  [address]   Procedure Note  [place] [id number]    Date: 09/13/2024   Patient: Joe Avery   DOB: [birth year]   DOA: 09/12/2024   MRN: [id number]   ROOM#: 2129/2129-A     Diagnosis: Urinary retention    Procedure: Bedside Cystoscopy, 10fr council tip catheter placement via guidewire    Anesthesia: none    Provider: Velma Ryder, PA-C    Complications: none    Specimens: none    Indication for Procedure: Joe Avery is a 63 y.o. year old male with history of DM2, CKD3, and HTN who presented with concern for LLE ischemia. AKI worsened and decreased urine output noted this morning with bladder scans >800cc. ICU staff unable to place catheter despite multiple attempts.    Description of procedure: The patient was then placed in the dorsal lithotomy position, genitalia sterilely prepped, and then draped in the usual fashion.  Flexible bedside cystoscopy was inserted under camera vision. The scope was advanced to the proximal bulbar urethra where a false passage with active bleeding was noted. The true urethral lumen was able to be seen at the 12 o'clock position just before this false passage, but was unable to be passed with the scope due to narrowing. I cannulated this lumen with a super stiff guidewire and carefully withdrew the scope. A 66fr council tip catheter was then inserted into the urethra via the guidewire and able to be advanced into the bladder with some difficulty and immediate return of pale yellow urine.  The balloon was then inflated with 10 cc sterile water  and catheter fastened to leg securing device.  The patient tolerated the procedure well with no complications.    Assessment & Plan:      Joe Avery is a 63 y.o. male with pmhx of DM2, CKD3, and HTN admitted 09/12/2024 for LLE ischemia.     1) Urinary Retention, Urethral Trauma: 49fr council tip catheter placed at bedside via cystoscopy/guidewire. 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              Continue catheter for at least 2 weeks prior to next voiding trial to allow urethra to heal.     2) AKI on CKD3: Cr 2.4     Pt stable from a GU standpoint. Will sign off, please call with any questions. Pt to follow up in our office in 1 month for reevaluation.     Electronically signed by Velma JONETTA Ryder, PA-C on 09/13/2024 at 2:39 PM  "

## 2024-09-13 NOTE — Progress Notes (Signed)
"  POC Blood glucose 231  "

## 2024-09-13 NOTE — Consults (Signed)
 "    [address]   [place], [place]  [address]   Consult Note  [place] [address]    Date: 09/13/2024   Patient: Joe Avery   DOB: 1960/11/30   DOA: 09/12/2024   MRN: [id number]   ROOM#: [id number]/2129-A     Reason for Consult: Assistance with placement of foley catheter; unsuccesful attempts   Requesting Physician: Anuradha Devara, PA-C  Collaborating Urologist on Call at time of admission: Dr. Ulysess  CHIEF COMPLAINT: Left thigh pain    History Obtained From: patient, electronic medical record    HISTORY OF PRESENT ILLNESS:                The patient is a 63 y.o. male with significant past medical history of DM2, CKD3, and HTN who presented with significant left upper thigh pain and decreased oral intake for several days.  Work-up in the ED revealed concern for LLE ischemia for which he was admitted.  This morning, he was noted to have decreased urine output and worsening AKI.  Bladder scan shows >800cc ICU staff attempted catheter placement multiple times with no success for which were consulted.  On exam, the patient reports left flank pain and nausea.  Denies history of any GU issues, infections, or surgeries.  Discussed with him recommendation for catheter placement and he is agreeable to proceed.    Procedure: Pt prepped and draped in the usual sterile fashion.  I attempted catheter placed with an 24 French coud catheter but met resistance near the prostatic urethra.  I suspected he has a false passage.  Patient is agreeable to cystoscopy.  See procedure note for further details.      ED Provider's HPI 09/12/2024: Geral Coker is a 63 y.o. male history of right arm weakness HTN stage IIIa CKD recent colonoscopy Dr. Madie 02/23/2024 from who presents to ED stating this morning when he woke up he had severe left upper thigh pain.  Neighbor at bedside stated when he found him he was sitting at his table having significant pain and asked him if he wanted go to the emergency department he said yes.  Stated he has  not been eating or drinking well the last couple days.  Denies any chest pain or shortness of breath.  Denied any fever.  Denied any loss of sensation just states it feels cold and is painful.  Denies any trauma or falls to the area.     Past Medical History:        Diagnosis Date    Hypertension     Type 2 diabetes mellitus without complication, without long-term current use of insulin  (HCC) 08/11/2023     Past Surgical History:        Procedure Laterality Date    CARDIAC PROCEDURE N/A 09/12/2024    Peripheral angiography performed by Bertell Clarine Nurse, MD at [facility] CARDIAC CATH LAB    COLONOSCOPY N/A 02/23/2024    COLONOSCOPY POLYPECTOMY SNARE/BIOPSY performed by Madie Dine, MD at [facility] ENDOSCOPY    INVASIVE VASCULAR N/A 09/12/2024    Thrombectomy peripheral artery performed by Bertell Clarine Nurse, MD at [facility] CARDIAC CATH LAB    INVASIVE VASCULAR N/A 09/13/2024    Thrombolysis peripheral artery performed by Merrianne Deatrice GORMAN DELENA, MD at [facility] CARDIAC CATH LAB     Current Medications:   Current Facility-Administered Medications: pantoprazole  (PROTONIX ) 40 mg in sodium chloride  (PF) 0.9 % 10 mL injection, 40 mg, IntraVENous, Daily  [Held by provider] insulin  lispro (HUMALOG ,ADMELOG ) injection  vial 15 Units, 15 Units, SubCUTAneous, TID WC  [Held by provider] insulin  glargine (LANTUS ) injection vial 30 Units, 30 Units, SubCUTAneous, Nightly  insulin  lispro (HUMALOG ,ADMELOG ) injection vial 0-8 Units, 0-8 Units, SubCUTAneous, 4x Daily AC & HS  [Held by provider] clopidogrel  (PLAVIX ) tablet 75 mg, 75 mg, Oral, Daily  heparin  (porcine) injection 4,100 Units, 80 Units/kg, IntraVENous, PRN  heparin  (porcine) injection 2,000 Units, 40 Units/kg, IntraVENous, PRN  heparin  25,000 unit in sodium chloride  0.45% 250 mL (premix) infusion, 5-30 Units/kg/hr, IntraVENous, Continuous  norepinephrine  (LEVOPHED ) 8 mg in sodium chloride  0.9 % 250 mL infusion, 1-30 mcg/min, IntraVENous, Continuous  sodium zirconium cyclosilicate  (LOKELMA ) oral  suspension 10 g, 10 g, Oral, TID  lactated ringers  bolus 500 mL, 500 mL, IntraVENous, Once  glucose chewable tablet 16 g, 4 tablet, Oral, PRN  dextrose  bolus 10% 125 mL, 125 mL, IntraVENous, PRN **OR** dextrose  bolus 10% 250 mL, 250 mL, IntraVENous, PRN  glucagon  injection 1 mg, 1 mg, SubCUTAneous, PRN  dextrose  10 % infusion, , IntraVENous, Continuous PRN  hydrocortisone  sodium succinate  PF (SOLU-CORTEF ) injection 100 mg, 100 mg, IntraVENous, Q8H  VASOpressin  20 Units in sodium chloride  0.9 % 100 mL IVPB (Vial2Bag), 0.03 Units/min, IntraVENous, Continuous  glucose chewable tablet 16 g, 4 tablet, Oral, PRN  dextrose  bolus 10% 125 mL, 125 mL, IntraVENous, PRN **OR** dextrose  bolus 10% 250 mL, 250 mL, IntraVENous, PRN  dextrose  10 % infusion, , IntraVENous, Continuous PRN  eptifibatide  (INTEGRILIN ) 0.75 mg/mL infusion, 1 mcg/kg/min, IntraVENous, Continuous  sodium chloride  flush 0.9 % injection 5-40 mL, 5-40 mL, IntraVENous, 2 times per day  sodium chloride  flush 0.9 % injection 5-40 mL, 5-40 mL, IntraVENous, PRN  0.9 % sodium chloride  infusion, , IntraVENous, PRN  polyethylene glycol (GLYCOLAX ) packet 17 g, 17 g, Oral, Daily PRN  [DISCONTINUED] acetaminophen  (TYLENOL ) tablet 650 mg, 650 mg, Oral, Q6H PRN **OR** acetaminophen  (TYLENOL ) suppository 650 mg, 650 mg, Rectal, Q6H PRN  dextrose  bolus 10% 125 mL, 125 mL, IntraVENous, PRN **OR** dextrose  bolus 10% 250 mL, 250 mL, IntraVENous, PRN  potassium chloride  10 mEq/100 mL IVPB (Peripheral Line), 10 mEq, IntraVENous, PRN  magnesium  sulfate 2000 mg in 50 mL IVPB premix, 2,000 mg, IntraVENous, PRN  sodium phosphate  15 mmol in sodium chloride  0.9 % 250 mL IVPB, 15 mmol, IntraVENous, PRN  dextrose  5 % and 0.45 % NaCl with KCl 20 mEq infusion, , IntraVENous, Continuous PRN  0.45 % sodium chloride  infusion, , IntraVENous, Continuous  senna (SENOKOT) tablet 8.6 mg, 1 tablet, Oral, Daily PRN  atorvastatin  (LIPITOR ) tablet 40 mg, 40 mg, Oral, Nightly  acetaminophen  (TYLENOL )  tablet 650 mg, 650 mg, Oral, Q4H PRN  [Held by provider] aspirin  chewable tablet 81 mg, 81 mg, Oral, Daily  ondansetron  (ZOFRAN -ODT) disintegrating tablet 4 mg, 4 mg, Oral, Q8H PRN **OR** ondansetron  (ZOFRAN ) injection 4 mg, 4 mg, IntraVENous, Q6H PRN  0.9 % sodium chloride  infusion, , IntraVENous, Continuous PRN  HYDROmorphone  (DILAUDID ) injection 0.5 mg, 0.5 mg, IntraVENous, Q3H PRN  amLODIPine  (NORVASC ) tablet 10 mg, 10 mg, Oral, Daily  gabapentin  (NEURONTIN ) capsule 100 mg, 100 mg, Oral, BID  hydrALAZINE  (APRESOLINE ) injection 10 mg, 10 mg, IntraVENous, Q4H PRN    Allergies:  Patient has no known allergies.    Social History:   TOBACCO:   reports that he has been smoking cigarettes. He started smoking about 44 years ago. He has a 21.7 pack-year smoking history. He has never used smokeless tobacco.  ETOH:   reports no history of alcohol use.  DRUGS:  reports that he does not currently use drugs.    Family History:       Problem Relation Age of Onset    Diabetes Father        REVIEW OF SYSTEMS:     Constitutional symptoms:(-) fever, (-) chills (-) headache  Eyes: (-) blurred vision, (-) double vision  (-) pain  Allergic/Immunologic:  (-) hay fever  Neurological: (-) tremors, (-) dizzy spells  (-) numbness/tingling  Endocrine: (-)  excessive thirst, (-) feeling too hot/cold (-)  tired/sluggish  Gastrointestinal:(-)  abdominal pain   (+) nausea/vomiting (-) indigestion  Cardiovascular: (-) chest pain, (-) varicose veins (-) HTN  Integumentary: (-)  skin rash (-)  boils (-) persistent itch  Musculoskeletal: (+) joint pain, (-) neck pain (-) back pain  Ear/Nose/Throat/Mouth: (-) ear infection (-) sore throat (-) sinus problems  Genitourinary: (-) hematuria, (-) dysuria, refer to HPI  Respiratory:  (-) problems  wheezing (-) frequent cough (-) SOB (-) DOE    PHYSICAL EXAM:      VITALS:  BP (!) 95/57   Pulse (!) 103   Temp 98 F (36.7 C) (Oral)   Resp 21   Ht 1.753 m (5' 9)   Wt 50.9 kg (112 lb 3.4 oz)   SpO2  96%   BMI 16.57 kg/m      TEMPERATURE:  Current - Temp: 98 F (36.7 C); Max - Temp  Avg: 98 F (36.7 C)  Min: 98 F (36.7 C)  Max: 98 F (36.7 C)  24HR BLOOD PRESSURE RANGE:  Systolic (24hrs), Avg:129 , Min:86 , Max:174   ; Diastolic (24hrs), Avg:92, Min:57, Max:127    8HR INTAKE OUTPUT:  In: 271.3 [I.V.:271.3]  Out: 1375 [Urine:1050]  URINARY CATHETER OUTPUT (Foley):     DRAIN/TUBE OUTPUT:        Physical Exam:  Gen: Pleasant male, in mild distress. Fatigued.  Neuro: Non-focal  Resp: Unlabored breathing  CV: Regular rate and rhythm  Abd: Soft, non-distended, non-tender to palpation, no rebound  Back:   No CVAT  GU: Circumcised phallus with no lesions. Some blood per urethra from prior catheter attempts noted.  Ext: No edema of bilateral LEs    DATA:    WBC:    Lab Results   Component Value Date/Time    WBC 13.5 09/13/2024 10:35 AM     Hemoglobin/Hematocrit:    Lab Results   Component Value Date/Time    HGB 10.8 09/13/2024 10:35 AM    HCT 32.1 09/13/2024 10:35 AM     BMP:    Lab Results   Component Value Date/Time    NA 135 09/13/2024 10:35 AM    K 6.9 09/13/2024 10:35 AM    CL 108 09/13/2024 10:35 AM    CO2 15 09/13/2024 10:35 AM    BUN 52 09/13/2024 10:35 AM    LABALBU SPECIMEN QUANTITY NOT SUFFICIENT 06/11/2023 02:51 PM    CREATININE 2.1 09/13/2024 10:35 AM    CALCIUM  7.3 09/13/2024 10:35 AM    LABGLOM 33 09/13/2024 10:35 AM     PT/INR:    Lab Results   Component Value Date/Time    PROTIME 15.5 09/12/2024 09:00 AM    INR 1.2 09/12/2024 09:00 AM     Imaging:  Invasive vascular procedure  Result Date: 09/13/2024  Successful thrombectomy of left iliac artery left SFA left common femoral artery left brachial artery and successful angioplasty of left iliac artery, left SFA left popliteal and ATA     CTA  LOWER EXTREMITY LEFT W CONTRAST  Result Date: 09/13/2024  CTA LOWER EXTREMITY LEFT W CONTRAST , 09/13/2024 4:36 HISTORY: Arterial occlusion COMPARISON: None CT radiation dose optimization techniques (automated  exposure control, use of iterative reconstruction techniques, or adjustment of the mA and/or kV according  to patient size) were used to limit patient radiation dose. IOPAMIDOL  76 % IV SOLN 75mL Scout topogram: No additional finding 3-D CT angiography technique. Abundant contrast in the urinary bladder. This is from a recent procedure. Enlarged prostate. There is an intra-arterial catheter seen in the common iliac artery bilaterally which appears to terminate in the region of left popliteal artery. Perhaps trace contrast demonstrated within common femoral artery. Otherwise no significant contrast opacification. Calcification of the popliteal artery. There is no runoff to the foot. Heterogeneous appearance of the musculature of the leg both in the thigh and below the knee may be due to regions of ischemic muscle and myonecrosis. Low density within the pectineus muscle and adjacent groin musculature compatible with areas of necrosis. The bones show no significant abnormality IMPRESSION: 1.  Intra-arterial catheter. Occluded arteries to the left lower extremity. Occluded from common femoral artery distally. 2.  Heterogeneous musculature throughout the lower extremity compatible with ischemia and developing myonecrosis Report was called to ICU Darryll) and discussed with nurse Kristin by phone on 09/13/2024 5:01 . See above for complete and additional details.   Dictated and Electronically Signed By: Fairy Maffucci, MD [facility] 09/13/2024 5:04        Vascular duplex lower extremity arteries left  Result Date: 09/12/2024  VAS DUP LOWER EXTREMITY ARTERIES LEFT EXAM DATE: 09/12/2024 17:35 DEMOGRAPHICS: 63 years old Male INDICATION: Acute limb ischemia COMPARISON: No existing relevant imaging study corresponding to the same anatomical region is available. TECHNIQUE: Left lower extremity Doppler waveforms and velocities were obtained FINDINGS: Left lower extremity: Velocities (cm/s ) and Waveforms: Common  femoral artery:  - 22 Monophasic. Profunda femoris:  Not identified. Superficial femoral:  - proximal : 44 The mid and distal segments appear occluded. Popliteal artery :  The popliteal artery appears occluded. Posterior tibial artery :  The posterior tibial artery appears occluded. Anterior tibial artery :  The anterior tibial artery appears occluded. IMPRESSION: Occluded left lower extremity arterial vasculature from the mid segment of the superficial femoral artery.  Dictated and Electronically Signed By: Glendia Shows, DO [facility] 09/12/2024 23:12    Findings were discussed with the patient's nurse Kristen on 09/12/2024 at 11:12 PM     Echo (TTE) complete (PRN contrast/bubble/strain/3D)  Result Date: 09/12/2024    Image quality is poor, patient screaming in pain during exam.   Left Ventricle: Normal left ventricular systolic function with a visually estimated EF of 55 - 60%. Left ventricle is smaller than normal. Normal wall thickness.  LVIDd is 3.1 cm. Normal wall motion.   Left Atrium: Left atrium is smaller than normal.   Right Ventricle: Normal systolic function.   Aortic Valve: Individual aortic valve leaflets not well visualized.   Pericardium: No pericardial effusion.     Cardiac procedure  Result Date: 09/12/2024  Indication left lower extremity acute  limb ischemia Procedure performed: Abdominal aortogram Renal angiogram Bilateral lower extremities peripheral angiogram  Conscious sedation: IV Versed and IV fentanyl  were given, please refer to log for dosing EBL:  50 cc Description of procedure: After informed consent was obtained the patient was brought to cardiac Cath Lab in a fasting state. Bilateral groins were draped prepped and sterile and usual  fashion. Right common femoral artery was accessed via ultrasound with 6 French sheath. On ultrasound the right common femoral artery was noted to be patent, needle was visualized accessing the artery. USG images were saved for medical  records This was followed by insertion of pigtail catheter and J-wire. Catheter was then placed in abdominal aorta Distal aortogram was performed which revealed patent distal aorta bifurcating into left and right common iliac artery.  Renal arteries bilateral were noted be widely patent Anatomy: Left side: Left common iliac artery has 60 to 70% stenosis, left common femoral artery 20 to 80% stenosis, left SFA mid segment 80% stenosis, possible vasospasm, popliteal arteries patent Left anterior tibial artery is occluded proximally Left posterior tibial artery is occluded mid segment No flow noted to the foot Limited angiogram on the right side Right common iliac external and femoral artery are noted to be patent Right patent proximal SFA and profunda Peripheral intervention After reviewing images, It was decided to intervene Command wire was then introduced into the anterior tibial artery Penumbra lightening bolt 6 XT X system was used in the anterior deep, no aspirate was obtained This was followed by the use of penumbra in the posterior tibial artery as well however no aspiration could be obtained. Followed by introduction of tPA catheter into the mid SFA Patient will be heparinized alongside tPA infusion at 0.5 mg/h for next 18 to 24 hours Will obtain a follow-up angiogram in the morning Dr Bertell ++++++++++++++++++++++++++ Moderate (conscious) sedation was administered under the direct supervision of the performing physician. The patient received intravenous midazolam (Versed) and fentanyl  for sedation and analgesia. Continuous monitoring of heart rate, blood pressure, oxygen saturation, respiratory rate, and level of consciousness was maintained throughout the procedure by qualified personnel under the physicians supervision. Total sedation time: 60 minutes. The patient tolerated the procedure well without immediate complications.     Invasive vascular procedure  Result Date: 09/12/2024  Indication left lower  extremity acute  limb ischemia Procedure performed: Abdominal aortogram Renal angiogram Bilateral lower extremities peripheral angiogram  Conscious sedation: IV Versed and IV fentanyl  were given, please refer to log for dosing EBL:  50 cc Description of procedure: After informed consent was obtained the patient was brought to cardiac Cath Lab in a fasting state. Bilateral groins were draped prepped and sterile and usual fashion. Right common femoral artery was accessed via ultrasound with 6 French sheath. On ultrasound the right common femoral artery was noted to be patent, needle was visualized accessing the artery. USG images were saved for medical records This was followed by insertion of pigtail catheter and J-wire. Catheter was then placed in abdominal aorta Distal aortogram was performed which revealed patent distal aorta bifurcating into left and right common iliac artery.  Renal arteries bilateral were noted be widely patent Anatomy: Left side: Left common iliac artery has 60 to 70% stenosis, left common femoral artery 20 to 80% stenosis, left SFA mid segment 80% stenosis, possible vasospasm, popliteal arteries patent Left anterior tibial artery is occluded proximally Left posterior tibial artery is occluded mid segment No flow noted to the foot Limited angiogram on the right side Right common iliac external and femoral artery are noted to be patent Right patent proximal SFA and profunda Peripheral intervention After reviewing images, It was decided to intervene Command wire was then introduced into the anterior tibial artery Penumbra lightening bolt 6 XT X system was used in the anterior deep, no aspirate was obtained This was followed by the use  of penumbra in the posterior tibial artery as well however no aspiration could be obtained. Followed by introduction of tPA catheter into the mid SFA Patient will be heparinized alongside tPA infusion at 0.5 mg/h for next 18 to 24 hours Will obtain a follow-up  angiogram in the morning Dr Bertell ++++++++++++++++++++++++++ Moderate (conscious) sedation was administered under the direct supervision of the performing physician. The patient received intravenous midazolam (Versed) and fentanyl  for sedation and analgesia. Continuous monitoring of heart rate, blood pressure, oxygen saturation, respiratory rate, and level of consciousness was maintained throughout the procedure by qualified personnel under the physicians supervision. Total sedation time: 60 minutes. The patient tolerated the procedure well without immediate complications.     XR CHEST PORTABLE  Result Date: 09/12/2024  PROCEDURE: XR CHEST PORTABLE DATE OF EXAM:  09/12/2024 9:53 DEMOGRAPHICS: 63 years old Male INDICATION: Altered Mental Status COMPARISON: None FINDINGS:  Hyperinflation. No pleural effusion, pneumothorax, or focal consolidation. Cardiac size is within normal limits. No acute osseous abnormality. IMPRESSION:  Hyperinflation without acute airspace disease  Dictated and Electronically Signed By: Jorene Plough, MD [facility] 09/12/2024 9:24          Assessment & Plan:      Akoni Parton is a 63 y.o. male with pmhx of DM2, CKD3, and HTN admitted 09/12/2024 for LLE ischemia.    1) Urinary Retention, Urethral Trauma: 42fr council tip catheter placed at bedside via cystoscopy/guidewire. 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   Continue catheter for at least 2 weeks prior to next voiding trial to allow urethra to heal.    2) AKI on CKD3: Cr 2.4    Pt stable from a GU standpoint. Will sign off, please call with any questions. Pt to follow up in our office in 1 month for reevaluation.    Patient seen and examined, chart reviewed.   >60 minutes spent in patient room and reviewing chart.  Electronically signed by Velma JONETTA Ryder, PA-C on 09/13/2024 at 1:48 PM  "

## 2024-09-13 NOTE — Plan of Care (Signed)
 Problem: Chronic Conditions and Co-morbidities  Goal: Patient's chronic conditions and co-morbidity symptoms are monitored and maintained or improved  Outcome: Not Progressing     Problem: Discharge Planning  Goal: Discharge to home or other facility with appropriate resources  Outcome: Not Progressing     Problem: Safety - Adult  Goal: Free from fall injury  Outcome: Not Progressing     Problem: Pain  Goal: Verbalizes/displays adequate comfort level or baseline comfort level  Outcome: Not Progressing     Problem: Chronic Conditions and Co-morbidities  Goal: Patient's chronic conditions and co-morbidity symptoms are monitored and maintained or improved  Outcome: Not Progressing     Problem: Discharge Planning  Goal: Discharge to home or other facility with appropriate resources  Outcome: Not Progressing     Problem: Safety - Adult  Goal: Free from fall injury  Outcome: Not Progressing     Problem: Pain  Goal: Verbalizes/displays adequate comfort level or baseline comfort level  Outcome: Not Progressing

## 2024-09-13 NOTE — Discharge Instr - COC (Addendum)
 "Continuity of Care Form    Patient Name: Joe Avery   DOB:  26-Feb-1961  MRN:  [id number]    Admit date:  09/12/2024  Discharge date:  09/13/24    Code Status Order: Full Code   Advance Directives:     Admitting Physician:  Melonie Carlena Baston, MD  PCP: Fernand Ruffing, MD    Discharging Nurse: Neo M RN  Discharging Hospital Unit/Room#: 2129/2129-A  Discharging Unit Phone Number: [phone]    Emergency Contact:   Extended Emergency Contact Information  Primary Emergency Contact: [facility]  Home Phone: [phone]  Relation: Parent  Secondary Emergency Contact: eckenrode,ron  Mobile Phone: [phone]  Relation: Other    Past Surgical History:  Past Surgical History:   Procedure Laterality Date    CARDIAC PROCEDURE N/A 09/12/2024    Peripheral angiography performed by Bertell Clarine Nurse, MD at [facility] CARDIAC CATH LAB    COLONOSCOPY N/A 02/23/2024    COLONOSCOPY POLYPECTOMY SNARE/BIOPSY performed by Madie Dine, MD at [facility] ENDOSCOPY    INVASIVE VASCULAR N/A 09/12/2024    Thrombectomy peripheral artery performed by Bertell Clarine Nurse, MD at [facility] CARDIAC CATH LAB    INVASIVE VASCULAR N/A 09/13/2024    Thrombolysis peripheral artery performed by Merrianne Deatrice GORMAN DELENA, MD at [facility] CARDIAC CATH LAB       Immunization History:   Immunization History   Administered Date(s) Administered    Influenza, FLUARIX, FLULAVAL, FLUZONE, (age 19 mo+), AFLURIA, (age 61 y+), IM, Trivalent PF, 0.5mL 08/12/2023    Pneumococcal, PCV20, PREVNAR 20, (age 19w+), IM, 0.17mL 04/09/2023, 07/11/2023    TDaP, ADACEL (age 61y-64y), BOOSTRIX (age 10y+), IM, 0.9mL 04/09/2023    Zoster Recombinant (Shingrix) 04/09/2023, 08/27/2023       Active Problems:  Patient Active Problem List   Diagnosis Code    Chronic kidney disease, stage III (moderate) (HCC) N18.30    Primary hypertension I10    Low vitamin D  level R79.89    Type 2 diabetes mellitus without complication, without long-term current use of insulin  (HCC) E11.9    Persistent proteinuria R80.1     Stroke-like symptoms R29.90    Primary insomnia F51.01    Chronic kidney disease, 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    Acute lower limb ischemia I99.8    Critical limb ischemia of left lower extremity (HCC) I70.222       Isolation/Infection:   Isolation            No Isolation          Patient Infection Status    None to display         Nurse Assessment:  Last Vital Signs: BP 101/70   Pulse (!) 108   Temp 98 F (36.7 C) (Oral)   Resp 22   Ht 1.753 m (5' 9)   Wt 50.9 kg (112 lb 3.4 oz)   SpO2 (!) 83%   BMI 16.57 kg/m     Last documented pain score (0-10 scale): Pain Level: 10  Last Weight:   Wt Readings from Last 1 Encounters:   09/13/24 50.9 kg (112 lb 3.4 oz)     Mental Status:  alert    IV Access:  - Peripheral IV - site  R Antecubital, insertion date: 09/12/24  PIV L AC 09/12/24  PIV R wrist 09/12/24    Nursing Mobility/ADLs:  Walking   Dependent  Transfer  Dependent  Bathing  Dependent  Dressing  Dependent  Toileting  Dependent  Feeding  Assisted  Med Admin  Assisted  Med Delivery   NPO    Wound Care Documentation and Therapy:        Elimination:  Continence:   Bowel: no BM   Bladder: yes  Urinary Catheter: Insertion Date: 09/13/24   Colostomy/Ileostomy/Ileal Conduit: No       Date of Last BM: Prior to admission    Intake/Output Summary (Last 24 hours) at 09/13/2024 1700  Last data filed at 09/13/2024 1632  Gross per 24 hour   Intake 4617.69 ml   Output 2150 ml   Net 2467.69 ml     I/O last 3 completed shifts:  In: 2527.4 [I.V.:2527.4]  Out: 515 [Urine:500; Blood:15]    Safety Concerns:     At Risk for Falls and Aspiration Risk    Impairments/Disabilities:      Strict bedrest    Nutrition Therapy:  Current Nutrition Therapy:   - NPO    Routes of Feeding: Oral and None  Liquids: NPO  Daily Fluid Restriction: no  Last Modified Barium Swallow with Video (Video Swallowing Test): not done    Treatments at the Time of Hospital Discharge:   Respiratory Treatments: NA  Oxygen Therapy:  is on oxygen at 2 L/min per nasal  cannula.  Ventilator:    - No ventilator support    Rehab Therapies: Na  Weight Bearing Status/Restrictions: Non-weight bearing on right leg  Other Medical Equipment (for information only, NOT a DME order):  cardiac equipment  Other Treatments: NA    Patient's personal belongings (please select all that are sent with patient):  Jewelry    RN SIGNATURE:  Electronically signed by Zuzu Befort X Yanin Muhlestein, RN on 09/13/24 at 5:06 PM EST    CASE MANAGEMENT/SOCIAL WORK SECTION    Inpatient Status Date: ***    Readmission Risk Assessment Score:  [facility] RISK OF UNPLANNED READMISSION 2.0             16.3 Total Score        Discharging to Facility/ Agency   Name:   Address:  Phone:  Fax:    Dialysis Facility (if applicable)   Name:  Address:  Dialysis Schedule:  Phone:  Fax:    Case Manager/Social Worker signature: {Esignature:304088025}    PHYSICIAN SECTION    Prognosis: {Prognosis:9728863785}    Condition at Discharge: {MH Patient Condition:304088024}    Rehab Potential (if transferring to Rehab): {Prognosis:9728863785}    Recommended Labs or Other Treatments After Discharge: ***    Physician Certification: I certify the above information and transfer of Reshaun Briseno  is necessary for the continuing treatment of the diagnosis listed and that he requires {Admit to Appropriate Level of Care:20763} for {GREATER/LESS:304500278} 30 days.     Update Admission H&P: {CHP DME Changes in YjwiE:695911954}    PHYSICIAN SIGNATURE:  {Esignature:304088025}  "

## 2024-09-13 NOTE — Progress Notes (Signed)
 "  Physician Progress Note      PATIENT:               Joe Avery, Joe Avery  CSN #:                  [id number]  DOB:                       10/26/1961  ADMIT DATE:       09/12/2024 8:26 AM  DISCH DATE:        09/13/2024 6:00 PM  RESPONDING  PROVIDER #:        CYRUS LADD          QUERY TEXT:    Critical Care,    Septic Shock is documented in the discharge summary. . Please provide   additional clinical indicators supportive of this diagnosis to include the   sepsis source and antibiotic treatment or please document if the diagnosis has   been ruled out after study.    PMH M, CKD stg III, HTN, PVD, COPD presented with c/o left leg pain and was   found to have critical limb ischemia w/ developing myonecrosis and DKA. The   DKA protocol, IV heparin  were initiated, Cardiology was consulted, patient   underwent  revascularization and was transfer to a higher level of care    -H&P documents: WBC 14.6 on admit, afebrile, lactic 11.7, suspect reactive in   setting of limb ischemia. No obvious s/sx infection. Monitor off antibiotics   currently. Trend CBC and fever curve daily.  DC summary documents: Acute lower limb ischemia-Septic shock  -Levohed, Vasopressin  and IVF bolus given    Thank you    The clinical indicators include:  Options provided:  -- Septic shock due to myonecrosis  present as evidenced by, Please document   evidence, antibiotics  -- Septic shock due to other infection present as evidenced by, Please   document evidence, antibiotic and infection source  -- Sepsis was ruled out after study, patient has SIRS due to DKA and limb   ischemia with acute organ dysfunction of hypotension due to cardiogenic  shock   and lactic acidosis  -- Sepsis was ruled out after study, patient has SIRS due to DKA and limb   ischemia with acute organ dysfunction of hypotension due to other shock and   lactic acidosis, please specify shock type  -- Other - I will add my own diagnosis  -- Disagree - Not applicable / Not valid  -- Refer  to Clinical Documentation Reviewer    PROVIDER RESPONSE TEXT:    Septic shock due to myonecrosis is present as evidenced by lactic acidosis,   increase in vasopressor requirement, end organ damage- AKI    Query created by: Charlene Pock on 09/15/2024 11:15 AM      QUERY TEXT:    Critical Care,    Severe malnutrition is documented in the medical record. Please provide   additional clinical indicators to include ASPEN criteria for weight loss and   percentage of energy intake  supportive of this diagnosis or please document   if the diagnosis has been ruled out after study.    PMH M, CKD stg III, HTN, PVD, COPD presented with c/o left leg pain and was   found to have critical limb ischemia w/ developing myonecrosis and DKA. The   DKA protocol, IV heparin  were initiated, Cardiology was consulted, patient   underwent  revascularization and was transfer  to a higher level of care.    -severe malnutrition documented by Critical Care, per documentation patient   has had decreased intake and cachexic appearing  -has BMI 16  Cardiology documents patient has had no weight loss    Thank you    The clinical indicators include:  Options provided:  -- Severe malnutrition  present as evidenced by, Please document evidence/    ASPEN criteria for weight loss and percentage of energy intake  -- Severe malnutrition ruled out after study, patient is underweight with BMI   16  -- Other - I will add my own diagnosis  -- Disagree - Not applicable / Not valid  -- Refer to Clinical Documentation Reviewer    PROVIDER RESPONSE TEXT:    Severe malnutrition is present as evidenced by BMI 16.57 with muscle wasting,   temporal wasting bilaterally, loss of sub cutaneous fat, difficulty with ADLs    Query created by: Charlene Pock on 09/15/2024 11:35 AM      Electronically signed by:  CYRUS LADD 09/15/2024 12:26 PM          "

## 2024-09-13 NOTE — Progress Notes (Signed)
"  Pt transferred at this time. Insulin  drip and heparin  gtt not adjusted prior to transfer per [facility] request.     Pt father Norleen, called and notified of transfer   "

## 2024-09-13 NOTE — Consults (Signed)
 "-------------------------------------------------------------------------------  Attestation signed by Caroll Agent, MD at 09/13/24 2019  I have personally seen and examined this patient. I have fully participated in the care of this patient. I have reviewed and agree with all pertinent clinical information including history, physical exam, labs, radiographic studies and the plan except as detailed below.      Plan:      Pt seen and examined    Outside images and peripheral angiographic images reviewed    Patient has had a protracted long acute limb ischemia to the left leg    I do feel they were successful with reperfusion with catheter directed thrombolysis but unfortunately patient developed compartment syndrome leading to recurrent symptoms of ischemia    He now appears to be in septic shock due to suspected ischemic left leg and rhabdomyolysis    I had a lengthy conversation with the patient    He is at risk of limb loss.    He is insensate and has no motor function    I gave patient numerous options including    1.) attempt at limb salvage with limited angiography and open surgical embolectomy with four compartment fasciotomy    2.) Guillotine amputation and limited angiography to ensure his profunda remains open    He understands that option 1 guarantees no limb salvage and that he would be at high risk of persistent rhabdomyolysis with reperfusion and he would be at risk of hemodialysis during his hospital course as well as risk of repeat thrombosis of his left leg and persistent septic shock    If he does remain in persistent septic shock despite revascularization and rhabdomyolysis than I would have no choice but to proceed with an amputation at that time    Despite lengthy discussion he would like to proceed with limb salvage as much as possible    We will plan on proceeding urgently tonight      Pan    -------------------------------------------------------------------------------         [facility]  [address] [place], [place], [address]  Office: [phone]  Fax: [phone] Norleen Candle, MD  Jordan Sasson, MD  Kimberlee Seitz, MD  Agent Caroll, MD  Aleene Burgess, MD  Mitzie All, PA-C  Duwaine Greaser, APRN  Geofm Eastern, APRN  Corean Rubenstein, APRN  Harlene Haff, APRN   Alan Hastings, APRN  Nena Belling, APRN  Izetta Shin, APRN   Medford Lather, PA-C     Vascular Surgery Consult Note     Name: Joe Avery DOB: September 22, 1961 63 year old   CSN: [id number]  Attending: Lucienne Isabella GAILS, MD   Date of Admission: 09/13/2024  7:00 PM Room/Bed: 0406/0406-A     Reason For Consultation: Left lower extremity critical limb ischemia    HISTORY    HPI: Joe Avery is a 63 year old male with HTN, diabetes mellitus type 2, CKD stage III, who presents as a transfer from [facility] with left lower extremity critical limb ischemia and DKA.  At [facility], CT imaging study showed occluded left common femoral artery distally, with heterogeneous musculature throughout lower extremity compatible with ischemia and developing myonecrosis.  He was started on heparin  drip and underwent angiogram bilateral lower extremities with thrombectomy on 11/3 with cardiology, and underwent repeat thrombectomy this morning with cardiology. Also noted was worsening renal function with hyperkalemia and metabolic acidosis and rhabdomyolysis with CK level> 22,000.  Patient states the pain, which he describes as a constant throbbing, started yesterday in his  left lower extremity.  He states that he typically ambulates without the use of assistance, and previously had complaints of left knee pain which would subside after resting.  He denies chest pain, abdominal pain, and shortness of breath.  He denies taking any blood thinners.  He states he lives in an assisted living facility.  Imaging from [facility] reviewed by Dr. Caroll.  Plan for OR Level One emergent left lower extremity angiogram, possible angioplasty, possible atherectomy,  possible stent, possible thrombectomy, possible 4 compartment fasciotomy, and all other indicated procedures with Dr. Caroll.      Past Medical History:  Past Medical History[1]  Past Surgical History:  Past Surgical History[2]  Medications:     Current Facility-Administered Medications:     mupirocin (BACTROBAN) 2 % topical ointment, , Nasal, BID, Claudene Waddell HERO, APRN    polyethylene glycol (MIRALAX ) packet 17 g, 1 Packet, Oral, Daily, Claudene Waddell HERO, APRN    heparin  injection 5,000 Units, 5,000 Units, Subcutaneous, Q8H, Claudene Waddell M, APRN    bisacodyL (DULCOLAX) enteric-coated tablet 5 mg, 5 mg, Oral, Daily PRN **OR** bisacodyL (DULCOLAX) suppository 10 mg, 10 mg, Rectal, Daily PRN, Claudene Waddell HERO, APRN  Family History:  Family History[3]  Social History:  Social History     Socioeconomic History    Marital status: Unable to Question     Spouse name: Not on file    Number of children: Not on file    Years of education: Not on file    Highest education level: Not on file   Occupational History    Not on file   Tobacco Use    Smoking status: Not on file    Smokeless tobacco: Not on file   Substance and Sexual Activity    Alcohol use: Not on file    Drug use: Not on file    Sexual activity: Not on file   Other Topics Concern    Not on file   Social History Narrative    Not on file     Social Drivers of Health     Financial Resource Strain: Not on file   Food Insecurity: No Food Insecurity (12/12/2023)    Received from [facility]    Hunger Vital Sign     Within the past 12 months, you worried that your food would run out before you got the money to buy more.: Never true     Within the past 12 months, the food you bought just didn't last and you didn't have money to get more.: Never true   Transportation Needs: No Transportation Needs (12/12/2023)    Received from [facility]    PRAPARE - Therapist, Art (Medical): No     Lack of  Transportation (Non-Medical): No   Physical Activity: Not on file   Stress: Not on file   Social Connections: Not on file   Intimate Partner Violence: Not on file   Housing Stability: Low Risk  (12/12/2023)    Received from [facility]    Housing Stability Vital Sign     In the last 12 months, was there a time when you were not able to pay the mortgage or rent on time?: No     In the past 12 months, how many times have you moved where you were living?: 0     At any time in the past 12 months, were you homeless  or living in a shelter (including now)?: No     Allergies: Patient has no allergy information on record.    Review of Systems:  Const: Denies fever, chills and fatigue  Eyes: Denies itching, burning and vision changes  Ent: Denies sore throat, hoarse and nosebleeds  Resp: Denies SOB, cough and sputum  Card: Denies chest pain, palpitations  GI:  Denies abdominal pain, nausea and vomiting  GU: Denies frequency, burning and itching  MS: Left lower extremity pain  H-L: Denies bleeding and bruising  Endo: Denies thirst, cold/heat intolerance and night sweats  Skin: Denies skin rash, lesions and ulcers  Neuro: Left lower extremity numbness and tingling  Imm: Denies allergy and rhinorrhea  Psych: Denies depression and anxiety      PHYSICAL EXAM  Vital Signs  Temp: 97.9 F (36.6 C) (09/13/24 1900) Temp  Min: 97.9 F (36.6 C)   Min taken time: 09/13/24 1900  Max: 97.9 F (36.6 C)   Max taken time: 09/13/24 1900 BP: 102/60 (09/13/24 1900) Pulse: 106 (09/13/24 1900) Resp: 22 (09/13/24 1900)          Intake & Output:  No intake/output data recorded.    Gen: alert, appropriate, no acute distress  Skin: no rashes, ecchymosis or jaundice  HEENT: sclera non-icteric, atraumatic  Heart: Sinus tachycardia  Resp: normal respiratory effort, on room air  Abd: Soft, non-tender, non-distended, no rebound, no guarding  Ext: 5/5 strength bilateral upper and right lower extremity, 0 out of 5 strength left lower  extremity  Vascular:   BUE: Warm, dry, no edema, radial pulses palpable  RLE: Cool, dry, no edema, DP/PT signals present, sensorimotor intact  LLE: Cold, dry, edema, DP/AT/PT/peroneal signals absent, popliteal multiphasic signal present, femoral pulse palpable sensorimotor absent  Neuro: no focal deficits    Imaging:  XA OUTSIDE IMAGES  Result Date: 09/13/2024  This exam is non-reportable and has been placed in the imaging system for   conveyance of charges and/or patient tracking and may or may not be   resulted on elsewhere in the [facility] Epic system.      XR MISCELLANEOUS OUTSIDE FILMS  Result Date: 09/13/2024  This exam is non-reportable and has been placed in the imaging system for   conveyance of charges and/or patient tracking and may or may not be   resulted on elsewhere in the [facility] Epic system.      CT OUTSIDE IMAGES  Result Date: 09/13/2024  This exam is non-reportable and has been placed in the imaging system for   conveyance of charges and/or patient tracking and may or may not be   resulted on elsewhere in the [facility] Epic system.      US  OUTSIDE IMAGES  Result Date: 09/13/2024  This exam is non-reportable and has been placed in the imaging system for   conveyance of charges and/or patient tracking and may or may not be   resulted on elsewhere in the [facility] Epic system.      CTA LOWER EXTREMITY LEFT W CONTRAST  Result Date: 09/13/2024  CTA LOWER EXTREMITY LEFT W CONTRAST  , 09/13/2024 4:36    HISTORY: Arterial occlusion        COMPARISON: None    CT radiation dose optimization techniques (automated exposure control, use of   iterative reconstruction techniques, or adjustment of the mA and/or kV according   to patient size) were used to limit patient radiation dose.  IOPAMIDOL  76 % IV SOLN 75mL  Scout topogram: No additional finding  3-D CT angiography technique.    Abundant contrast in the urinary bladder. This is from a recent procedure.   Enlarged prostate. There is an intra-arterial catheter seen in the common iliac    artery bilaterally which appears to terminate in the region of left popliteal   artery. Perhaps trace contrast demonstrated within common femoral artery.   Otherwise no significant contrast opacification. Calcification of the popliteal   artery. There is no runoff to the foot.    Heterogeneous appearance of the musculature of the leg both in the thigh and   below the knee may be due to regions of ischemic muscle and myonecrosis. Low   density within the pectineus muscle and adjacent groin musculature compatible   with areas of necrosis.    The bones show no significant abnormality    IMPRESSION:   1.  Intra-arterial catheter. Occluded arteries to the left lower extremity.   Occluded from common femoral artery distally.    2.  Heterogeneous musculature throughout the lower extremity compatible with   ischemia and developing myonecrosis  Report was called to ICU Darryll) and discussed with nurse Kristin by phone on   09/13/2024 5:01 .  See above for complete and additional details.          Dictated and Electronically Signed By:   Fairy Maffucci, MD   [facility]   09/13/2024 5:04          Labs:    CBC No results for input(s): WBC, RBC, HEMOGLOBIN, HEMATOCRIT, PLATELETS, MCV, MCH, MCHC, RDW, MPV, HCOM in the last 36 hours.  CMP   Recent Labs     09/13/24  1936   GLUCOSE 292*     PT/APTT No results for input(s): PROA, PT, PTTA, PTT in the last 36 hours.    ASSESSMENT  Stepehn Eckard is a 63 year old male with left lower extremity critical limb ischemia.  Patient seen and examined at bedside.  Patient resting in bed, no acute distress.  Plan to take to the OR for Level One emergent left lower extremity angiogram, possible angioplasty, possible atherectomy, possible stent, possible thrombectomy, possible 4 compartment fasciotomy, and all other indicated procedures with Dr. Caroll.    PLAN  - OR for Level One emergent left lower extremity angiogram, possible angioplasty, possible  atherectomy, possible stent, possible thrombectomy, possible 4 compartment fasciotomy, and all other indicated procedures with Dr. Caroll.      Plan D/w and formalized with Dr.  Caroll   Electronically signed by:   Nena MARLA Belling, APRN  [facility]  09/13/2024  7:16 PM               [1]  No past medical history on file.  [2]  No past surgical history on file.  [3]  No family history on file.  "

## 2024-09-13 NOTE — Progress Notes (Signed)
 "     Inpatient Progress Note 09/13/2024        Jamarie Mussa  1961/02/10  [id number]      Assessment/Plan:    Ischemic left lower extremity (leg)  Peripheral vascular disease  Lactic acidosis secondary to ischemic leg plus ketoacidosis  Diabetic ketoacidosis  Diabetes mellitus type 2 (note poor control based on hemoglobin A1c value)  Acute kidney injury superimposed on CKD stage III  Hypertension per history        Maintain anticoagulation, thrombolytics and interventions (repeat thrombectomy this morning) per cardiology service  IV fluids, IV insulin   Monitor renal function, electrolytes, urine output (Place Foley catheter given marginal urine output)  Ulcer prophylaxis  Maintain mean blood pressure 65 mmHg addition of pressor if necessary     Complex medical decisions required for evaluation and management, reviewed during critical care rounds    Cardiology service also appraised of the patient's status    E Cordasco  [phone]    Subjective:    No acute overnight issues reported per discussion with nursing staff.    Marginal urine output over the past 24 hours, 515 cc.  2527 cc input    Transitioned off insulin  infusion this morning with clearance of anion gap per endocrinology service.    Marginal blood pressure noted during rounds, mean approximately 58 mmHg.      Review of Systems     Patient mildly sedated postprocedure, continues to note left lower extremity discomfort when prompted.    Physical Exam:            BP 125/86   Pulse 97   Temp 98 F (36.7 C) (Oral)   Resp 17   Ht 1.753 m (5' 9)   Wt 50.9 kg (112 lb 3.4 oz)   SpO2 98%   BMI 16.57 kg/m       General: Cachectic chronically ill-appearing male, arouses interacts follows simple commands, vitals reviewed  Eyes: Reactive pupils motor intact.  ENT: Head temporal wasting.  Hearing intact.  Dry oral mucosa.  Neck supple  Cardiovascular: Regular rate.  Respiratory: Clear to auscultation  Gastrointestinal: Soft, non tender, bowel  sounds.  Genitourinary: Normal male external genitalia  Musculoskeletal: No edema, both feet are cool to palpation, cyanotic discoloration of the left foot, neither dopplerable no palpable pulses left foot.  The patient notes preservation of sensation however.  Right femoral introducer.  Skin: warm, dry  Neuro: Sleepy but arouses, follows commands and no obvious focal findings  Psych: Mood calm    Current Medications:   pantoprazole  (PROTONIX ) 40 mg in sodium chloride  (PF) 0.9 % 10 mL injection  40 mg IntraVENous Daily    insulin  lispro  15 Units SubCUTAneous TID WC    insulin  glargine  30 Units SubCUTAneous Nightly    insulin  lispro  0-8 Units SubCUTAneous 4x Daily AC & HS    clopidogrel   600 mg Oral Once    [START ON 09/14/2024] clopidogrel   75 mg Oral Daily    norepinephrine         calcium  gluconate  1,000 mg IntraVENous Once    albuterol   10 mg Nebulization Once    sodium bicarbonate   50 mEq IntraVENous Once    sodium zirconium cyclosilicate   10 g Oral TID    sodium chloride  flush  5-40 mL IntraVENous 2 times per day    atorvastatin   40 mg Oral Nightly    aspirin   81 mg Oral Daily    amLODIPine   10 mg Oral  Daily    gabapentin   100 mg Oral BID       Labs, Imaging and Studies reviewed:    CTA LOWER EXTREMITY LEFT W CONTRAST  Result Date: 09/13/2024  CTA LOWER EXTREMITY LEFT W CONTRAST , 09/13/2024 4:36 HISTORY: Arterial occlusion COMPARISON: None CT radiation dose optimization techniques (automated exposure control, use of iterative reconstruction techniques, or adjustment of the mA and/or kV according  to patient size) were used to limit patient radiation dose. IOPAMIDOL  76 % IV SOLN 75mL Scout topogram: No additional finding 3-D CT angiography technique. Abundant contrast in the urinary bladder. This is from a recent procedure. Enlarged prostate. There is an intra-arterial catheter seen in the common iliac artery bilaterally which appears to terminate in the region of left popliteal artery. Perhaps trace contrast  demonstrated within common femoral artery. Otherwise no significant contrast opacification. Calcification of the popliteal artery. There is no runoff to the foot. Heterogeneous appearance of the musculature of the leg both in the thigh and below the knee may be due to regions of ischemic muscle and myonecrosis. Low density within the pectineus muscle and adjacent groin musculature compatible with areas of necrosis. The bones show no significant abnormality IMPRESSION: 1.  Intra-arterial catheter. Occluded arteries to the left lower extremity. Occluded from common femoral artery distally. 2.  Heterogeneous musculature throughout the lower extremity compatible with ischemia and developing myonecrosis Report was called to ICU Darryll) and discussed with nurse Kristin by phone on 09/13/2024 5:01 . See above for complete and additional details.   Dictated and Electronically Signed By: Fairy Maffucci, MD [facility] 09/13/2024 5:04        Vascular duplex lower extremity arteries left  Result Date: 09/12/2024  VAS DUP LOWER EXTREMITY ARTERIES LEFT EXAM DATE: 09/12/2024 17:35 DEMOGRAPHICS: 63 years old Male INDICATION: Acute limb ischemia COMPARISON: No existing relevant imaging study corresponding to the same anatomical region is available. TECHNIQUE: Left lower extremity Doppler waveforms and velocities were obtained FINDINGS: Left lower extremity: Velocities (cm/s ) and Waveforms: Common femoral artery:  - 22 Monophasic. Profunda femoris:  Not identified. Superficial femoral:  - proximal : 44 The mid and distal segments appear occluded. Popliteal artery :  The popliteal artery appears occluded. Posterior tibial artery :  The posterior tibial artery appears occluded. Anterior tibial artery :  The anterior tibial artery appears occluded. IMPRESSION: Occluded left lower extremity arterial vasculature from the mid segment of the superficial femoral artery.  Dictated and Electronically Signed By: Glendia Shows,  DO [facility] 09/12/2024 23:12    Findings were discussed with the patient's nurse Kristen on 09/12/2024 at 11:12 PM     Echo (TTE) complete (PRN contrast/bubble/strain/3D)  Result Date: 09/12/2024    Image quality is poor, patient screaming in pain during exam.   Left Ventricle: Normal left ventricular systolic function with a visually estimated EF of 55 - 60%. Left ventricle is smaller than normal. Normal wall thickness.  LVIDd is 3.1 cm. Normal wall motion.   Left Atrium: Left atrium is smaller than normal.   Right Ventricle: Normal systolic function.   Aortic Valve: Individual aortic valve leaflets not well visualized.   Pericardium: No pericardial effusion.     Cardiac procedure  Result Date: 09/12/2024  Indication left lower extremity acute  limb ischemia Procedure performed: Abdominal aortogram Renal angiogram Bilateral lower extremities peripheral angiogram  Conscious sedation: IV Versed and IV fentanyl  were given, please refer to log for dosing EBL:  50 cc Description of procedure: After  informed consent was obtained the patient was brought to cardiac Cath Lab in a fasting state. Bilateral groins were draped prepped and sterile and usual fashion. Right common femoral artery was accessed via ultrasound with 6 French sheath. On ultrasound the right common femoral artery was noted to be patent, needle was visualized accessing the artery. USG images were saved for medical records This was followed by insertion of pigtail catheter and J-wire. Catheter was then placed in abdominal aorta Distal aortogram was performed which revealed patent distal aorta bifurcating into left and right common iliac artery.  Renal arteries bilateral were noted be widely patent Anatomy: Left side: Left common iliac artery has 60 to 70% stenosis, left common femoral artery 20 to 80% stenosis, left SFA mid segment 80% stenosis, possible vasospasm, popliteal arteries patent Left anterior tibial artery is occluded  proximally Left posterior tibial artery is occluded mid segment No flow noted to the foot Limited angiogram on the right side Right common iliac external and femoral artery are noted to be patent Right patent proximal SFA and profunda Peripheral intervention After reviewing images, It was decided to intervene Command wire was then introduced into the anterior tibial artery Penumbra lightening bolt 6 XT X system was used in the anterior deep, no aspirate was obtained This was followed by the use of penumbra in the posterior tibial artery as well however no aspiration could be obtained. Followed by introduction of tPA catheter into the mid SFA Patient will be heparinized alongside tPA infusion at 0.5 mg/h for next 18 to 24 hours Will obtain a follow-up angiogram in the morning Dr Bertell ++++++++++++++++++++++++++ Moderate (conscious) sedation was administered under the direct supervision of the performing physician. The patient received intravenous midazolam (Versed) and fentanyl  for sedation and analgesia. Continuous monitoring of heart rate, blood pressure, oxygen saturation, respiratory rate, and level of consciousness was maintained throughout the procedure by qualified personnel under the physicians supervision. Total sedation time: 60 minutes. The patient tolerated the procedure well without immediate complications.     Invasive vascular procedure  Result Date: 09/12/2024  Indication left lower extremity acute  limb ischemia Procedure performed: Abdominal aortogram Renal angiogram Bilateral lower extremities peripheral angiogram  Conscious sedation: IV Versed and IV fentanyl  were given, please refer to log for dosing EBL:  50 cc Description of procedure: After informed consent was obtained the patient was brought to cardiac Cath Lab in a fasting state. Bilateral groins were draped prepped and sterile and usual fashion. Right common femoral artery was accessed via ultrasound with 6 French sheath. On ultrasound the  right common femoral artery was noted to be patent, needle was visualized accessing the artery. USG images were saved for medical records This was followed by insertion of pigtail catheter and J-wire. Catheter was then placed in abdominal aorta Distal aortogram was performed which revealed patent distal aorta bifurcating into left and right common iliac artery.  Renal arteries bilateral were noted be widely patent Anatomy: Left side: Left common iliac artery has 60 to 70% stenosis, left common femoral artery 20 to 80% stenosis, left SFA mid segment 80% stenosis, possible vasospasm, popliteal arteries patent Left anterior tibial artery is occluded proximally Left posterior tibial artery is occluded mid segment No flow noted to the foot Limited angiogram on the right side Right common iliac external and femoral artery are noted to be patent Right patent proximal SFA and profunda Peripheral intervention After reviewing images, It was decided to intervene Command wire was then introduced into the anterior  tibial artery Penumbra lightening bolt 6 XT X system was used in the anterior deep, no aspirate was obtained This was followed by the use of penumbra in the posterior tibial artery as well however no aspiration could be obtained. Followed by introduction of tPA catheter into the mid SFA Patient will be heparinized alongside tPA infusion at 0.5 mg/h for next 18 to 24 hours Will obtain a follow-up angiogram in the morning Dr Bertell ++++++++++++++++++++++++++ Moderate (conscious) sedation was administered under the direct supervision of the performing physician. The patient received intravenous midazolam (Versed) and fentanyl  for sedation and analgesia. Continuous monitoring of heart rate, blood pressure, oxygen saturation, respiratory rate, and level of consciousness was maintained throughout the procedure by qualified personnel under the physicians supervision. Total sedation time: 60 minutes. The patient tolerated the  procedure well without immediate complications.     XR CHEST PORTABLE  Result Date: 09/12/2024  PROCEDURE: XR CHEST PORTABLE DATE OF EXAM:  09/12/2024 9:53 DEMOGRAPHICS: 63 years old Male INDICATION: Altered Mental Status COMPARISON: None FINDINGS:  Hyperinflation. No pleural effusion, pneumothorax, or focal consolidation. Cardiac size is within normal limits. No acute osseous abnormality. IMPRESSION:  Hyperinflation without acute airspace disease  Dictated and Electronically Signed By: Jorene Plough, MD [facility] 09/12/2024 9:24          Recent Results (from the past 24 hours)   CBC    Collection Time: 09/12/24  1:37 PM   Result Value Ref Range    WBC 17.7 (H) 4.0 - 10.5 k/uL    RBC 4.61 4.60 - 6.20 m/uL    Hemoglobin 13.6 13.5 - 18.0 g/dL    Hematocrit 61.1 (L) 42.0 - 52.0 %    MCV 84.2 78.0 - 100.0 fL    MCH 29.5 27.0 - 31.0 pg    MCHC 35.1 32.0 - 36.0 g/dL    RDW 87.6 88.2 - 85.0 %    Platelets 227 140 - 440 k/uL    MPV 12.5 (H) 7.5 - 11.1 fL   Basic metabolic panel    Collection Time: 09/12/24  1:37 PM   Result Value Ref Range    Sodium 143 136 - 145 mmol/L    Potassium 4.9 3.5 - 5.1 mmol/L    Chloride 102 99 - 110 mmol/L    CO2 20 (L) 21 - 32 mmol/L    Anion Gap 22 (H) 9 - 17 mmol/L    Glucose 522 (HH) 74 - 99 mg/dL    BUN 53 (H) 7 - 20 mg/dL    Creatinine 1.8 (H) 0.8 - 1.3 mg/dL    Est, Glom Filt Rate 38 (L) >60 mL/min/1.46m2    Calcium  9.2 8.3 - 10.6 mg/dL   Fibrinogen    Collection Time: 09/12/24  1:37 PM   Result Value Ref Range    Fibrinogen 325 170 - 540 mg/dL   Beta-Hydroxybutyrate    Collection Time: 09/12/24  1:37 PM   Result Value Ref Range    Beta-Hydroxybutyrate 5.74 (H) 0.00 - 0.27 mmol/L   Anti-XA, Heparin     Collection Time: 09/12/24  1:37 PM   Result Value Ref Range    Anti-XA Unfrac Heparin  >1.10 (HH) IU/L   POCT Glucose    Collection Time: 09/12/24  2:02 PM   Result Value Ref Range    POC Glucose 477 (HH) 74 - 99 mg/dL   Echo (TTE) complete (PRN contrast/bubble/strain/3D)     Collection Time: 09/12/24  2:30 PM   Result Value Ref Range  LV EDV A4C 37 mL    LV ESV A4C 24 mL    IVSd 0.8 0.6 - 1.0 cm    LVIDd 3.1 (A) 4.2 - 5.9 cm    LVIDs 1.9 cm    LVOT Diameter 1.9 cm    LVOT Mean Gradient 3 mmHg    LVOT VTI 14.8 cm    LVOT Peak Velocity 1.2 m/s    LVOT Peak Gradient 5 mmHg    LVPWd 0.7 0.6 - 1.0 cm    LV Ejection Fraction A4C 36 %    LVOT Area 2.8 cm2    LVOT SV 42.0 ml    AV Mean Gradient 3 mmHg    AV VTI 15.6 cm    AV Mean Velocity 0.8 m/s    AV Peak Velocity 1.1 m/s    AV Peak Gradient 5 mmHg    AV Area by VTI 2.7 cm2    AV Area by Peak Velocity 2.9 cm2    IVC Proxmal 0.9 cm    LA Diameter 1.3 cm    RV Mid Dimension 2.5 cm    TR Max Velocity 2.09 m/s    TR Peak Gradient 17 mmHg    Body Surface Area 1.5 m2    Fractional Shortening 2D 39 28 - 44 %    LV ESV Index A4C 15 mL/m2    LV EDV Index A4C 24 mL/m2    LVIDd Index 2.00 cm/m2    LVIDs Index 1.23 cm/m2    LV RWT Ratio 0.45     LV Mass 2D 56.8 (A) 88 - 224 g    LV Mass 2D Index 36.6 (A) 49 - 115 g/m2    LVOT Stroke Volume Index 27.1 mL/m2    LA Size Index 0.84 cm/m2    AV Velocity Ratio 1.09     LVOT:AV VTI Index 0.95     AVA/BSA VTI 1.7 cm2/m2    AVA/BSA Peak Velocity 1.9 cm2/m2    EF Physician 55 %   Lactic Acid Now and in 2 Hours    Collection Time: 09/12/24  2:53 PM   Result Value Ref Range    Lactic Acid 3.7 (HH) 0.4 - 2.0 mmol/L   Anti-Xa, Unfractionated Heparin     Collection Time: 09/12/24  2:53 PM   Result Value Ref Range    Anti-XA Unfrac Heparin  >1.10 (HH) IU/L   Basic Metabolic Panel    Collection Time: 09/12/24  2:53 PM   Result Value Ref Range    Sodium 144 136 - 145 mmol/L    Potassium 4.4 3.5 - 5.1 mmol/L    Chloride 101 99 - 110 mmol/L    CO2 17 (L) 21 - 32 mmol/L    Anion Gap 26 (H) 9 - 17 mmol/L    Glucose 501 (HH) 74 - 99 mg/dL    BUN 53 (H) 7 - 20 mg/dL    Creatinine 1.8 (H) 0.8 - 1.3 mg/dL    Est, Glom Filt Rate 39 (L) >60 mL/min/1.91m2    Calcium  9.1 8.3 - 10.6 mg/dL   Magnesium     Collection Time: 09/12/24  2:53  PM   Result Value Ref Range    Magnesium  2.8 (H) 1.8 - 2.4 mg/dL   Phosphorus    Collection Time: 09/12/24  2:53 PM   Result Value Ref Range    Phosphorus 3.0 2.5 - 4.9 mg/dL   CBC    Collection Time: 09/12/24  2:53 PM   Result Value Ref Range  WBC 17.0 (H) 4.0 - 10.5 k/uL    RBC 4.74 4.60 - 6.20 m/uL    Hemoglobin 13.7 13.5 - 18.0 g/dL    Hematocrit 60.0 (L) 42.0 - 52.0 %    MCV 84.2 78.0 - 100.0 fL    MCH 28.9 27.0 - 31.0 pg    MCHC 34.3 32.0 - 36.0 g/dL    RDW 87.6 88.2 - 85.0 %    Platelets 239 140 - 440 k/uL    MPV 12.6 (H) 7.5 - 11.1 fL   APTT    Collection Time: 09/12/24  2:53 PM   Result Value Ref Range    APTT >240.0 (H) 25.1 - 37.1 sec   Fibrinogen    Collection Time: 09/12/24  2:53 PM   Result Value Ref Range    Fibrinogen 323 170 - 540 mg/dL   POCT Glucose    Collection Time: 09/12/24  3:24 PM   Result Value Ref Range    POC Glucose 464 (HH) 74 - 99 mg/dL   POCT Glucose    Collection Time: 09/12/24  4:18 PM   Result Value Ref Range    POC Glucose 347 (H) 74 - 99 mg/dL   POCT Glucose    Collection Time: 09/12/24  6:07 PM   Result Value Ref Range    POC Glucose 296 (H) 74 - 99 mg/dL   POCT Glucose    Collection Time: 09/12/24  7:09 PM   Result Value Ref Range    POC Glucose 129 (H) 74 - 99 mg/dL   EKG 12 lead    Collection Time: 09/12/24  7:45 PM   Result Value Ref Range    Ventricular Rate 103 BPM    Atrial Rate 103 BPM    P-R Interval 122 ms    QRS Duration 88 ms    Q-T Interval 416 ms    QTc Calculation (Bazett) 544 ms    P Axis 79 degrees    R Axis 104 degrees    T Axis 73 degrees    Diagnosis       Sinus tachycardia  Biatrial enlargement  Rightward axis  Prolonged QT  Abnormal ECG  When compared with ECG of 12-Dec-2023 12:44,  Incomplete right bundle branch block is no longer present  QT has lengthened     Basic Metabolic Panel    Collection Time: 09/12/24  7:50 PM   Result Value Ref Range    Sodium 147 (H) 136 - 145 mmol/L    Potassium 4.0 3.5 - 5.1 mmol/L    Chloride 109 99 - 110 mmol/L    CO2 24  21 - 32 mmol/L    Anion Gap 13 9 - 17 mmol/L    Glucose 107 (H) 74 - 99 mg/dL    BUN 52 (H) 7 - 20 mg/dL    Creatinine 1.6 (H) 0.8 - 1.3 mg/dL    Est, Glom Filt Rate 44 (L) >60 mL/min/1.33m2    Calcium  9.2 8.3 - 10.6 mg/dL   Magnesium     Collection Time: 09/12/24  7:50 PM   Result Value Ref Range    Magnesium  3.0 (H) 1.8 - 2.4 mg/dL   Phosphorus    Collection Time: 09/12/24  7:50 PM   Result Value Ref Range    Phosphorus 3.8 2.5 - 4.9 mg/dL   CBC    Collection Time: 09/12/24  7:50 PM   Result Value Ref Range    WBC 17.6 (H) 4.0 - 10.5 k/uL  RBC 4.52 (L) 4.60 - 6.20 m/uL    Hemoglobin 13.1 (L) 13.5 - 18.0 g/dL    Hematocrit 62.4 (L) 42.0 - 52.0 %    MCV 83.0 78.0 - 100.0 fL    MCH 29.0 27.0 - 31.0 pg    MCHC 34.9 32.0 - 36.0 g/dL    RDW 87.4 88.2 - 85.0 %    Platelets 230 140 - 440 k/uL    MPV 12.7 (H) 7.5 - 11.1 fL   APTT    Collection Time: 09/12/24  7:50 PM   Result Value Ref Range    APTT 227.4 (H) 25.1 - 37.1 sec   Fibrinogen    Collection Time: 09/12/24  7:50 PM   Result Value Ref Range    Fibrinogen 326 170 - 540 mg/dL   POCT Glucose    Collection Time: 09/12/24  8:18 PM   Result Value Ref Range    POC Glucose 118 (H) 74 - 99 mg/dL   POCT Glucose    Collection Time: 09/12/24  9:19 PM   Result Value Ref Range    POC Glucose 193 (H) 74 - 99 mg/dL   Lactic Acid    Collection Time: 09/12/24  9:50 PM   Result Value Ref Range    Lactic Acid 1.3 0.4 - 2.0 mmol/L   POCT Glucose    Collection Time: 09/12/24 10:46 PM   Result Value Ref Range    POC Glucose 237 (H) 74 - 99 mg/dL   POCT Glucose    Collection Time: 09/12/24 11:41 PM   Result Value Ref Range    POC Glucose 232 (H) 74 - 99 mg/dL   Basic Metabolic Panel    Collection Time: 09/13/24 12:15 AM   Result Value Ref Range    Sodium 135 (L) 136 - 145 mmol/L    Potassium 6.3 (HH) 3.5 - 5.1 mmol/L    Chloride 106 99 - 110 mmol/L    CO2 20 (L) 21 - 32 mmol/L    Anion Gap 8 (L) 9 - 17 mmol/L    Glucose 629 (HH) 74 - 99 mg/dL    BUN 46 (H) 7 - 20 mg/dL    Creatinine 1.5  (H) 0.8 - 1.3 mg/dL    Est, Glom Filt Rate 48 (L) >60 mL/min/1.56m2    Calcium  7.4 (L) 8.3 - 10.6 mg/dL   Magnesium     Collection Time: 09/13/24 12:15 AM   Result Value Ref Range    Magnesium  2.5 (H) 1.8 - 2.4 mg/dL   Phosphorus    Collection Time: 09/13/24 12:15 AM   Result Value Ref Range    Phosphorus 3.5 2.5 - 4.9 mg/dL   Lactic Acid    Collection Time: 09/13/24 12:15 AM   Result Value Ref Range    Lactic Acid 1.9 0.4 - 2.0 mmol/L   POCT Glucose    Collection Time: 09/13/24 12:44 AM   Result Value Ref Range    POC Glucose 139 (H) 74 - 99 mg/dL   POCT Glucose    Collection Time: 09/13/24  1:49 AM   Result Value Ref Range    POC Glucose 147 (H) 74 - 99 mg/dL   CBC    Collection Time: 09/13/24  2:05 AM   Result Value Ref Range    WBC 12.2 (H) 4.0 - 10.5 k/uL    RBC 4.38 (L) 4.60 - 6.20 m/uL    Hemoglobin 12.7 (L) 13.5 - 18.0 g/dL    Hematocrit 62.3 (L) 42.0 -  52.0 %    MCV 85.8 78.0 - 100.0 fL    MCH 29.0 27.0 - 31.0 pg    MCHC 33.8 32.0 - 36.0 g/dL    RDW 87.4 88.2 - 85.0 %    Platelets 204 140 - 440 k/uL    MPV 12.6 (H) 7.5 - 11.1 fL   Fibrinogen    Collection Time: 09/13/24  2:05 AM   Result Value Ref Range    Fibrinogen 312 170 - 540 mg/dL   Anti-Xa, Unfractionated Heparin     Collection Time: 09/13/24  3:55 AM   Result Value Ref Range    Anti-XA Unfrac Heparin  0.50 IU/L   Basic Metabolic Panel    Collection Time: 09/13/24  3:55 AM   Result Value Ref Range    Sodium 138 136 - 145 mmol/L    Potassium 5.1 3.5 - 5.1 mmol/L    Chloride 106 99 - 110 mmol/L    CO2 21 21 - 32 mmol/L    Anion Gap 11 9 - 17 mmol/L    Glucose 171 (H) 74 - 99 mg/dL    BUN 54 (H) 7 - 20 mg/dL    Creatinine 1.7 (H) 0.8 - 1.3 mg/dL    Est, Glom Filt Rate 41 (L) >60 mL/min/1.30m2    Calcium  8.1 (L) 8.3 - 10.6 mg/dL   Magnesium     Collection Time: 09/13/24  3:55 AM   Result Value Ref Range    Magnesium  2.7 (H) 1.8 - 2.4 mg/dL   Phosphorus    Collection Time: 09/13/24  3:55 AM   Result Value Ref Range    Phosphorus 4.2 2.5 - 4.9 mg/dL   POCT  Glucose    Collection Time: 09/13/24  4:01 AM   Result Value Ref Range    POC Glucose 174 (H) 74 - 99 mg/dL   POCT Glucose    Collection Time: 09/13/24  6:04 AM   Result Value Ref Range    POC Glucose 209 (H) 74 - 99 mg/dL   Lactic Acid    Collection Time: 09/13/24  6:20 AM   Result Value Ref Range    Lactic Acid 1.0 0.4 - 2.0 mmol/L   POCT Glucose    Collection Time: 09/13/24  7:32 AM   Result Value Ref Range    POC Glucose 288 (H) 74 - 99 mg/dL   POCT Glucose    Collection Time: 09/13/24  8:23 AM   Result Value Ref Range    POC Glucose 231 (H) 74 - 99 mg/dL   POC ACT-Low Range    Collection Time: 09/13/24  8:56 AM   Result Value Ref Range    Activated Clotting Time, Low Range 380 (H) 89 - 169 sec   POCT Glucose    Collection Time: 09/13/24  9:31 AM   Result Value Ref Range    POC Glucose 113 (H) 74 - 99 mg/dL   POCT Glucose    Collection Time: 09/13/24  9:44 AM   Result Value Ref Range    POC Glucose 203 (H) 74 - 99 mg/dL   Invasive vascular procedure    Collection Time: 09/13/24  9:57 AM   Result Value Ref Range    Body Surface Area 1.5 m2   CBC    Collection Time: 09/13/24 10:35 AM   Result Value Ref Range    WBC 13.5 (H) 4.0 - 10.5 k/uL    RBC 3.70 (L) 4.60 - 6.20 m/uL    Hemoglobin 10.8 (L) 13.5 -  18.0 g/dL    Hematocrit 67.8 (L) 42.0 - 52.0 %    MCV 86.8 78.0 - 100.0 fL    MCH 29.2 27.0 - 31.0 pg    MCHC 33.6 32.0 - 36.0 g/dL    RDW 86.9 88.2 - 85.0 %    Platelets 175 140 - 440 k/uL    MPV 12.7 (H) 7.5 - 11.1 fL   APTT    Collection Time: 09/13/24 10:35 AM   Result Value Ref Range    APTT PENDING sec   Fibrinogen    Collection Time: 09/13/24 10:35 AM   Result Value Ref Range    Fibrinogen 279 170 - 540 mg/dL   Lactic Acid    Collection Time: 09/13/24 10:35 AM   Result Value Ref Range    Lactic Acid 3.5 (HH) 0.4 - 2.0 mmol/L   Basic Metabolic Panel    Collection Time: 09/13/24 10:35 AM   Result Value Ref Range    Sodium 135 (L) 136 - 145 mmol/L    Potassium 6.9 (HH) 3.5 - 5.1 mmol/L    Chloride 108 99 - 110  mmol/L    CO2 15 (L) 21 - 32 mmol/L    Anion Gap 13 9 - 17 mmol/L    Glucose 296 (H) 74 - 99 mg/dL    BUN 52 (H) 7 - 20 mg/dL    Creatinine 2.1 (H) 0.8 - 1.3 mg/dL    Est, Glom Filt Rate 33 (L) >60 mL/min/1.28m2    Calcium  7.3 (L) 8.3 - 10.6 mg/dL   CK    Collection Time: 09/13/24 10:35 AM   Result Value Ref Range    Total CK >22,000 (H) 26 - 192 U/L   Anti-XA, Heparin     Collection Time: 09/13/24 10:35 AM   Result Value Ref Range    Anti-XA Unfrac Heparin  PENDING IU/L   Magnesium     Collection Time: 09/13/24 10:35 AM   Result Value Ref Range    Magnesium  3.0 (H) 1.8 - 2.4 mg/dL   Phosphorus    Collection Time: 09/13/24 10:35 AM   Result Value Ref Range    Phosphorus 6.3 (H) 2.5 - 4.9 mg/dL       Electronically signed by Dallas CHRISTELLA Argyle Mickey, DO on 09/13/2024 at 11:27 AM  "

## 2024-09-14 LAB — EKG 12-LEAD
Atrial Rate: 103 {beats}/min
P Axis: 79 degrees
P-R Interval: 122 ms
Q-T Interval: 416 ms
QRS Duration: 88 ms
QTc Calculation (Bazett): 544 ms
R Axis: 104 degrees
T Axis: 73 degrees
Ventricular Rate: 103 {beats}/min

## 2024-09-14 NOTE — Telephone Encounter (Addendum)
"  LM for pt to call and schedule f/u.  Ask who pt wants to see, as he is not an established pt. And he has already seen Selestine Mao and Ashraf in hospital.  Pt did have intervention, so should be scheduled within 7-10 days please and thank you    "

## 2024-09-15 NOTE — Consults (Signed)
 "-------------------------------------------------------------------------------  Attestation signed by Fayne Eulas POUR, MD at 09/15/24 1358 (Updated)  Nephrology   I have personally seen and examined this patient. I have fully participated in the care of this patient and  have reviewed and agree with all pertinent clinical information including history, physical exam and lab work detailed below by my medical resident.  I have also reviewed and agree with the medications, allergies and past medical history section for this patient.  I have formulated and discussed the plan of care with my medical resident who detailed it below.    Electronically signed by: Eulas POUR Fayne, MD, MS, FACP, FASN 09/15/2024  1:54 PM    -------------------------------------------------------------------------------                                           Formerly known as   [facility]     CONSULT NOTE - Nephrology      Joe Avery is  63 year old with a PMH of CKD3, HTN, T2DM who presented with critical limb ischemia in setting of occluded L common femoral artery. He underwent a thrombectomy at outside hospital, then was transferred to [facility] for vascular surgery evaluation, who performed  a procedure including embolectomies/thrombectomies, four compartment fasciotomy, angiography on 11/04. Then, he developed myonecrosis leading to rhabdomyolysis and and shock. Nephrology[gy consulted for AKI secondary to ATN    ASSESSMENT      AKI on CKD3a with oliguria, secondary to ATN, etiology multifactorial including: rhabdomyolysis, contrast nephropathy, shock  -- Cr 4.7 today, baseline Cr 1.1  -- Rhabdomyolysis due to critical limbi ischemia with compartment syndrome, now s/p embolectomies/thrombectomies and four compartment fasciotomy 11/04  -- Exposed to IV contrast via angiogram 11/4, with subsequent rise in Cr from 2.6 to 4.7 within 48 hrs  -- Distributive shock  previously requiring vasopressors, now improved and off vasopressors  Mild non-anion gap metabolic acidosis  Uremia  Hyperkalemia on presentation, now improved  Liver transaminitis   Occluded L common femoral artery s/p thrombectomies 11/3, 11/4  Distributive shock, improved       PLAN     - No emergent need for hemodialysis at this time, likely will need to start CRRT within the next 24-48 hrs  - Continue IVFs  - RPP q8h  - Avoid nephrotoxins and sustained hypotension     Electronically signed by: Sheffield GORMAN Milian, 09/15/2024 12:39 PM  Call [phone] matchMD [phone] 5 PM-8AM & Weekends) for oncall [facility]    SUBJECTIVE     CC: Asked by the attending physician to see for ATN     HPI:   Joe Avery is  63 year old with a PMH of CKD3a, HTN, T2DM who presented with critical limb ischemia in setting of occluded L common femoral artery. He underwent a thrombectomy at outside hospital 11/3 and 11/4, then was transferred to [facility] for vascular surgery evaluation, who performed  a procedure including embolectomies/thrombectomies, four compartment fasciotomy, angiography on 11/04. He initially presented with hyperkalemia 6.1, anion gap metabolic acidosis, AKI with cr 2.7, suggestive of rhabdomyolysis. Hospitalization has been complicated by myonecrosis and shock. Shock investigated; infectious causes ruled out, no evidence of impaired cardiac output (EF 55-60% on echo 11/3), no evidence of volume depletion, suggesting shock distributive in setting of myonecrosis. He required vasopressors initially  for management of this shock, but was weaned off overnight/early this morning.   Rhabdomyolysis managed with IVFs. He is on continuous IVFs for management of rhabo. CK elevated throughout hospitalization, >22,000 and noted to be 89,540 today. Cr has been rising as well; Cr 4.7 today. Nephrology consulted for AKI secondary to ATN    PMH: as above  FH: negative for renal disease  SH: no tob or etoh use  ROS: a review  of systems was performed, pertinent positives and negatives are listed as above.  All other systems were rev'd and are negative.        PHYSICAL EXAM     Vital Signs:  Temp:  [97.3 F (36.3 C)-100 F (37.8 C)] 98.3 F (36.8 C)  Pulse:  [75-110] 75  Resp:  [12-25] 16  BP: (82-118)/(52-63) 101/57   Vitals:    09/13/24 1900 09/13/24 1957 09/15/24 0600   Weight: 50.9 kg (112 lb 3.4 oz) 45.5 kg (100 lb 5 oz) 60.8 kg (134 lb 0.6 oz)        General: intubated and sedated  HEENT: no scleral icterus  Neck: supple trachea midline  Heart: nl S1 S2 no rub or gallop  Lungs:clear to auscultation bilaterally, on minimal vent settings  Abdomen: soft, non tender non distended   Extremities: LLE wound wrapped with bloody drainage, purplish discoloration of foot, RLE warm, no edema  Skin/Derm: warm, dry with no rashes  Neuro: sedated  Psych:  sedated  MSK: no muscle atrophy  Lines: left radial A-line, RIJ single lumen CVC    Labs         Results from last 7 days   Lab Units 09/15/24  0416 09/14/24  1510 09/14/24  0455   SODIUM mEq/L 145 145 147   CHLORIDE mEq/L 112* 111* 111*   CARBON DIOXIDE mEq/L 21 24 20    BUN mg/dL 70* 64* 59*   CREATININE mg/dL 4.7* 3.7* 2.8*   PHOSPHORUS mg/dL 5.7* 5.0* 6.4*   MAGNESIUM  mg/dL 2.2  --  2.2       Imaging related to current encounter have been directly reviewed   Medications reviewed at [facility], please refer to Epic records  "

## 2024-09-16 LAB — POC ACT-LR
Activated Clotting Time, Low Range: 232 s — ABNORMAL HIGH (ref 89–169)
Activated Clotting Time, Low Range: 294 s — ABNORMAL HIGH (ref 89–169)

## 2024-09-17 NOTE — Progress Notes (Signed)
"  ICM Case Manager Opening Interview Progress Note    Presents for evaluation/treatment of Vascular insufficiency [I99.8].   Patient  has a past medical history of Essential (primary) hypertension, HLD (hyperlipidemia), and Type 2 diabetes mellitus without complications (HC CODE).     Met with Joe Avery who is a 63 year old male at bedside. Introduced self and role of sports coach; asked for and received permission to discuss discharge planning.    Discussed current living situation/dc plans. Patient lives with Alone in a Apartment, with 3 or more floors (Apartment is one level and no es to get in. Pt has elevator to get to third floor apartment.).  Baseline Activity: Independent, Drives Car  Current DME Equipment:DME: None  Current home oxygen needs (Frequency, Liters, supplier): N/A- [facility]  Passport/Waiver Services: N/A If yes, case production designer, theatre/television/film name/number:N/A  Transportation needs at discharge: Ambulance  Agreeable to home care follow-up at discharge.      PCP Name : Fernand Ruffing [phone]  Patient to have prescriptions filled at [facility] at dc: No [facility] [place])    DC plans:   EDD: Sep 24, 2024  Discharge disposition: ECF   Transportation needs at discharge: Ambulance  Primary support person name/ relationship: John- Father  Barriers to D/C : Nephro/Vasc Surg Following, Heparin  gtt, Dilaudid  PCA, 11/10 OR for LLE BKA, Foley, Art Line, Wound Care, HD while inpatient, CVC  Additional Needs: Watch for new HD needs    Patient expresses agreement in discharge plan.    MOON/IM       Document Type Received On Received By Description    CMS IM Copy of Signed 09/17/24 Jerrol Gattis HERO, RN              Will monitor patient's progress and readiness for discharge.    Electronically signed by: Gattis HERO Jerrol, RN, Case Manager, Phone [phone], 09/17/2024 4:23 PM    Weekday Office Hours: 8:30-5:00. Holiday/Weekends [phone]. For urgent needs between 5p-7p, please call [phone]. If after 7pm, please call [facility] at  [phone] or [facility] at [phone].    "

## 2024-09-17 NOTE — Progress Notes (Signed)
 "[facility]  [facility]  Norleen Candle, MD  Jordan Sasson, MD  Kimberlee Seitz, MD  Lynwood Keeler, MD  Aleene Burgess, MD  Mitzie All, PA-C  Duwaine Greaser, APRN  Geofm Eastern, APRN  Corean Rubenstein, APRN  Harlene Haff, APRN   Alan Hastings, APRN  Nena Belling, APRN  Izetta Shin, APRN   Medford Lather, PA-C      Post-operative Check  09/17/2024 10:49 AM    Diagnosis: ARTERIAL THROMBOSIS OF LEFT LEG  RABDOMYOLYSIS  SEPTIC SHOCK  Operation: LEFT ILIAC THROMBECTOMY, POSTERIOR TIBIAL AND DORSALIS PEDAL THROMBECTOMY WITH PATCH ANGIOPLASTY, 4 COMPARTMENT FASCIOTOMY, LEFT LEG ANGIOGRAPHY  Attending Surgeon: Surgeons and Role:     DEWAINE Keeler Lynwood, MD - Primary    Subjective:  Patient was seen and examined this morning with Dr. Seitz, patient resting in bed comfortably, no signs of acute distress.  Overnight vascular APP was called at the bedside for bleeding fasciotomy sites.  Patient's dressing was changed at the bedside, silver nitrate stick applied to focal bleeding spot to the medial lateral muscle sites.  Hemostasis was achieved at the 2 spots.  Surrounding areas continue with slow ooze as to be expected.  A wet-to-dry dressing was reapplied and reinforced with 4 x 4, ABD pads, Kerlix x 2 and Ace.  Patient reported 10 out of 10 pain overnight oral agents were added and a short course of Norflex was added which seem to help the patient overnight. Patient continues to be unable to move his left foot at this time.    Temp: 97.8 F (36.6 C) (09/17/24 0400) Temp  Avg: 97.9 F (36.6 C)  Min: 97.3 F (36.3 C)  Max: 98.6 F (37 C) BP: 116/68 (09/17/24 0600) Pulse: 83 (09/17/24 0600) Resp: 15 (09/17/24 0600) SpO2: 97 % (09/17/24 0600)     I/O last 3 completed shifts:  In: 3176.2 [P.O.:650; I.V.:2426.2; Other:100]  Out: 1116 [Urine:116; Dialysis UF:1000]    Physical Exam  General: Alert, appropriate, no acute distress  CV: Regular rate and rhythm, no ectopy  Pulm: normal respiratory effort    Abdomen:  Soft, non-tender, non-distended, no rebound, no guarding  Incision:  wet-to-dry dressing in place with 4 x 4, ABD pads, Kerlix x 2 and Ace to the left 4 compartment fasciotomy site  Extremities: Multiphasic PT signal, absent DP signal mottling to the dorsal aspect of the foot of the left lower extremity.      Labs  Labs reviewed    Assessment/Plan:   63 year old male POD4 LEFT ILIAC THROMBECTOMY, POSTERIOR TIBIAL AND DORSALIS PEDAL THROMBECTOMY WITH PATCH ANGIOPLASTY, 4 COMPARTMENT FASCIOTOMY, LEFT LEG ANGIOGRAPHY.  Patient tolerated procedure well.  Over the course of the weekend we will allow time for swelling of the leg to improve to see if he regains any function, if not, will most likely recommend BKA, guillotine given the edema that is present.  Dr. Keeler spoke to the patient at the bedside yesterday where he explained to the patient that he continues to have some areas of ischemia to his foot, and given that he is unable to move his foot he would be better served with a below-knee amputation opposed to a partial foot amputation that is not functional.  Patient was understanding to this.    Patient was seen and examined this morning with Dr. Seitz, patient resting in bed comfortably, no signs of acute distress.  Overnight vascular APP was called at the bedside for bleeding fasciotomy sites.  Patient's dressing  was changed at the bedside, silver nitrate stick applied to focal bleeding spot to the medial lateral muscle sites.  Hemostasis was achieved at the 2 spots.  Surrounding areas continue with slow ooze as to be expected.  A wet-to-dry dressing was reapplied and reinforced with 4 x 4, ABD pads, Kerlix x 2 and Ace.  Patient reported 10 out of 10 pain overnight oral agents were added and a short course of Norflex was added which seem to help the patient overnight. Patient continues to be unable to move his left foot at this time.    At this time recommend proceeding to the OR on Monday for  left lower extremity guillotine BKA.  All risk, benefits and alternatives were discussed with the patient at the bedside and he is agreeable.    -Vascular: OR tentatively on Monday for left lower extremity guillotine below-knee amputation with Dr. Felizardo  -Appreciate critical care assistance with patient  -Appreciate nephrology's assistance  -Wound care per bedside nurse: Wet-to-dry dressing changes daily for left lower extremity.  -Continue to monitor H&H  - Diet: Advance as tolerated  - Pain: Continue multimodal pain control  - Continue Zosyn    Plan and case per Dr. Jamelle    Electronically signed by:   Mitzie FORBES All, PA-C  09/17/2024  10:49 AM    "

## 2024-09-18 NOTE — Progress Notes (Signed)
 "          [facility]                                   NEPHROLOGY PROGRESS NOTE   Patient Name: Joe Avery DOB: 1961/07/12   [id number] LOS: 5 Sable Re, MD  Follow up for acute kidney injury.  This is a 63 year old gentleman with a history of chronic kidney disease stage IIIa, hypertension, type 2 diabetes who was admitted with critical limb ischemia due to occluded left common femoral artery with compartment syndrome. This was confirmed by left leg angiography.  Patient underwent embolectomies/thrombectomies and fasciotomy of 4 compartments on 11/4. Hospital course complicated by acute kidney injury requiring institution of renal replacement therapy.  Surgery is planning left lower extremity amputation tomorrow and patient is agreeable  ASSESSMENT      63 year old  male  With  for  chief complaint No chief complaint on file.      1. Acute oligoanuric kidney injury due to multiple factors including toxic ATN from acute rhabdomyolysis and IV contrast exposure alongside hemodynamic mediated ischemic ATN on account of shock on top of CKD 3a.  No evidence of renal improvement at this time.  2.  Underlying CKD 3a.  Hypertensive nephrosclerosis and diabetic renal disease  3.  Occluded common left femoral artery with acute compartment syndrome status post fasciotomies/thrombectomy  4.  Mixed acid-base disorder   5.  Shock resolved  6.  Acute blood loss anemia  7.  Hypertension controlled  8.  Type 2 diabetes mellitus  9.  Dyslipidemia       PLAN   - Continue current supportive care  -There is no renal contraindication to the left lower extremity amputation scheduled for tomorrow  - Next Dialysis scheduled for Tuesday  -Kidney function is expected to improve  eventually      EULAS MARLA CUTTING, MD, MS, FACP, FASN 09/18/24 1:33 PM   Call [phone] matchMD ([phone] 5 PM-8AM & Weekends) for oncall [facility]       SUBJECTIVE   Seen this morning.  Comfortable.  Had an uneventful  hemodialysis yesterday.  Still complains of mild pain in the left lower extremity   PHYSICAL EXAM   Vital Signs:  Temp: 97.7 F (36.5 C) (09/18/24 1300) Temp  Min: 97.7 F (36.5 C)   Min taken time: 09/18/24 1300  Max: 98.5 F (36.9 C)   Max taken time: 09/18/24 0800 BP: 122/77 (09/18/24 1300) Pulse: 94 (09/18/24 1300) Resp: 19 (09/18/24 1300) SpO2: 93 % (09/18/24 1300)     I/O last 3 completed shifts:  In: 1269 [P.O.:150; I.V.:619; Blood:500]  Out: 2044 [Urine:44; Dialysis UF:2000]    Admission: Weight: 50.9 kg (112 lb 3.4 oz) (09/13/24 1900)    Most recent: Weight: 68.1 kg (150 lb 2.1 oz) (09/18/24 0600)       Exam  Mental: Awake and alert  General: Comfortable  HEENT: sclera is white   Neck: No jugular venous distention  Heart: S1 and S2 S1 and S2 without a gallop  Lungs: Clear to auscultation and percussion  Abdomen:soft  CNS: Grossly intact  Extremities: No edema      Labs:     Recent Results (from the past 24 hours)   POC GLUCOSE    Collection Time: 09/17/24  5:54 PM   Result Value Ref Range    POC Glucose 77 70 -  99 mg/dL    Scan Result     HEPARIN  APTT    Collection Time: 09/17/24  7:27 PM   Result Value Ref Range    Heparin  aPTT 121.1 (HH) 74.0 - 110.0 Sec   POC GLUCOSE    Collection Time: 09/17/24  8:26 PM   Result Value Ref Range    POC Glucose 123 (H) 70 - 99 mg/dL    Scan Result     POC GLUCOSE    Collection Time: 09/18/24  1:10 AM   Result Value Ref Range    POC Glucose 79 70 - 99 mg/dL    Scan Result     BASIC METABOLIC PANEL    Collection Time: 09/18/24  2:04 AM   Result Value Ref Range    Sodium 145 135 - 148 mEq/L    Potassium 4.3 3.4 - 5.3 mEq/L    Chloride 106 96 - 110 mEq/L    Carbon Dioxide 28 19 - 32 mEq/L    BUN 28 3 - 29 mg/dL    Creatinine 3.5 (H) 0.5 - 1.4 mg/dL    Glucose 70 70 - 99 mg/dL    Calcium  7.5 (L) 8.5 - 10.5 mg/dL    Anion Gap 11 5 - 15    BUN/CREAT Ratio 8 7 - 25    Estimated GFR 19 (L) >=60 mL/min/1.7m*2   MAGNESIUM , SERUM    Collection Time: 09/18/24  2:04 AM   Result  Value Ref Range    Magnesium  1.8 1.4 - 2.5 mg/dL   COMPLETE BLOOD COUNT    Collection Time: 09/18/24  2:04 AM   Result Value Ref Range    WBC Count 9.4 3.5 - 10.9 K/uL    RBC 2.36 (L) 4.14 - 5.80 M/uL    Hemoglobin 6.9 (LL) 13.0 - 17.7 g/dL    Hematocrit 79.6 (L) 37.5 - 51.0 %    MCV 86.0 80.0 - 100.0 fL    MCH 29.2 26.0 - 34.0 pg    MCHC 34.0 30.7 - 35.5 g/dL    RDW 84.6 (H) <=84.9 %    Platelet Count 112 (L) 140 - 400 K/uL    MPV 12.1 (H) 7.2 - 11.7 fL    nRBC 0 <=0 /100 WBCs    Scan Result     CK (CPK), TOTAL    Collection Time: 09/18/24  2:04 AM   Result Value Ref Range    CK >22,000 (H) 0 - 250 U/L   HEPARIN  APTT    Collection Time: 09/18/24  2:04 AM   Result Value Ref Range    Heparin  aPTT 46.3 (LL) 74.0 - 110.0 Sec   PREPARE RED BLOOD CELLS    Collection Time: 09/18/24  3:00 AM   Result Value Ref Range    UNIT PRODUCT CODE E0336V00     UNIT ID [id number]     UNIT ABO A     UNIT RH NEGATIVE     UNIT INTERPRETATION Compatible     UNIT DISPENSE STATUS Crossmatched     UNIT EXPIRATION DATE [id number]     UNIT BLOOD TYPE 0600     BLOOD CODING SYS ISBT 128    POC GLUCOSE    Collection Time: 09/18/24  4:36 AM   Result Value Ref Range    POC Glucose 87 70 - 99 mg/dL    Scan Result     POC GLUCOSE    Collection Time: 09/18/24  8:29 AM   Result Value Ref Range  POC Glucose 105 (H) 70 - 99 mg/dL    Scan Result     HEPARIN  APTT    Collection Time: 09/18/24  9:58 AM   Result Value Ref Range    Heparin  aPTT 52.7 (LL) 74.0 - 110.0 Sec   HEMOGLOBIN AND HEMATOCRIT    Collection Time: 09/18/24  9:58 AM   Result Value Ref Range    Hemoglobin 8.7 (L) 13.0 - 17.7 g/dL    Hematocrit 73.9 (L) 37.5 - 51.0 %    Scan Result     POC GLUCOSE    Collection Time: 09/18/24 11:31 AM   Result Value Ref Range    POC Glucose 122 (H) 70 - 99 mg/dL    Scan Result     PROTHROMBIN TIME    Collection Time: 09/18/24 11:48 AM   Result Value Ref Range    Prothrombin Time 13.0 11.7 - 13.9 Sec    INR 1.0 0.9 - 1.1   ACTIVATED PARTIAL  THROMBOPLASTIN TIME    Collection Time: 09/18/24 11:48 AM   Result Value Ref Range    APTT 56.9 (H) 24.5 - 35.2 Sec   ANTI-FACTOR XA UFH    Collection Time: 09/18/24 11:48 AM   Result Value Ref Range    ANTI-FACTOR XA UFH 0.23 (LL) 0.30 - 0.70 IU/ml   COMPLETE BLOOD COUNT    Collection Time: 09/18/24 11:48 AM   Result Value Ref Range    WBC Count 11.1 (H) 3.5 - 10.9 K/uL    RBC 3.09 (L) 4.14 - 5.80 M/uL    Hemoglobin 8.9 (L) 13.0 - 17.7 g/dL    Hematocrit 73.0 (L) 37.5 - 51.0 %    MCV 87.1 80.0 - 100.0 fL    MCH 28.8 26.0 - 34.0 pg    MCHC 33.1 30.7 - 35.5 g/dL    RDW 84.6 (H) <=84.9 %    Platelet Count 137 (L) 140 - 400 K/uL    MPV 12.5 (H) 7.2 - 11.7 fL    nRBC 0 <=0 /100 WBCs    Scan Result     PREPARE RED BLOOD CELLS    Collection Time: 09/18/24 12:05 PM   Result Value Ref Range    UNIT PRODUCT CODE [id number]     UNIT ID [id number]     UNIT ABO A     UNIT RH NEGATIVE     UNIT INTERPRETATION Compatible     UNIT DISPENSE STATUS Crossmatched     UNIT EXPIRATION DATE [id number]     UNIT BLOOD TYPE 0600     BLOOD CODING SYS ISBT 128        Imaging related to current encounter have been directly reviewed   Medications reviewed at [facility], please refer to Epic records                                                                                    "

## 2024-09-18 NOTE — Progress Notes (Signed)
 "-------------------------------------------------------------------------------  Attestation signed by Sasson, Jordan R, MD at 09/19/24 1501  I have personally seen and examined this patient. I have fully participated in the care of this patient and I have reviewed and agree with all pertinent clinical information including history, physical exam, and plan. I have also reviewed and agree with the medications, allergies and past medical history section for this patient.    Electronically signed by: Jordan R Sasson, MD, 09/19/2024  3:01 PM   -------------------------------------------------------------------------------    [facility]  [facility]  Norleen Candle, MD  Jordan Sasson, MD  Kimberlee Seitz, MD  Lynwood Keeler, MD  Aleene Burgess, MD  Mitzie All, PA-C  Duwaine Greaser, APRN  Geofm Eastern, APRN  Corean Rubenstein, APRN  Harlene Haff, APRN   Alan Hastings, APRN  Nena Belling, APRN  Izetta Shin, APRN   Medford Lather, PA-C      Post-operative Check  09/18/2024 11:26 AM    Diagnosis: ARTERIAL THROMBOSIS OF LEFT LEG  RABDOMYOLYSIS  SEPTIC SHOCK  Operation: LEFT ILIAC THROMBECTOMY, POSTERIOR TIBIAL AND DORSALIS PEDAL THROMBECTOMY WITH PATCH ANGIOPLASTY, 4 COMPARTMENT FASCIOTOMY, LEFT LEG ANGIOGRAPHY  Attending Surgeon: Surgeons and Role:     DEWAINE Keeler Lynwood, MD - Primary    Subjective:  Patient seen and examined this morning with Dr. Felizardo.  Patient resting in bed comfortably, no signs of acute distress.  Pain currently well-controlled on PCA pump.  No acute events to report from overnight.    Temp: 98.5 F (36.9 C) (09/18/24 0800) Temp  Avg: 98.2 F (36.8 C)  Min: 98 F (36.7 C)  Max: 98.5 F (36.9 C) BP: 117/86 (09/18/24 0800) Pulse: 84 (09/18/24 0800) Resp: 10 (09/18/24 0600) SpO2: 93 % (09/18/24 0800)     I/O last 3 completed shifts:  In: 1269 [P.O.:150; I.V.:619; Blood:500]  Out: 2044 [Urine:44; Dialysis UF:2000]    Physical Exam  General: Alert, appropriate, no acute  distress  CV: Regular rate and rhythm, no ectopy  Pulm: normal respiratory effort   Abdomen:  Soft, non-tender, non-distended, no rebound, no guarding  Incision:  wet-to-dry dressing in place with 4 x 4, ABD pads, Kerlix x 2 and Ace to the left 4 compartment fasciotomy site  Extremities: Multiphasic PT signal, absent DP signal mottling to the dorsal aspect of the foot of the left lower extremity with multiple bullae.  Absent sensory to the calf and foot      Labs  Labs reviewed  CBC   Recent Labs     09/18/24  0958 09/18/24  0204   WBC  --  9.4   HEMOGLOBIN 8.7* 6.9*   HEMATOCRIT 26.0* 20.3*   PLATELETS  --  112*   MCV  --  86.0   MCH  --  29.2   MCHC  --  34.0   RDW  --  15.3*   MPV  --  12.1*     BMP   Recent Labs     09/18/24  1131 09/18/24  0436 09/18/24  0204   NA  --   --  145   POTASSIUM  --   --  4.3   CL  --   --  106   CO2  --   --  28   GLUCOSE 122*   < > 70   CREATININE  --   --  3.5*   BUN  --   --  28   CA  --   --  7.5*    < > =  values in this interval not displayed.       Assessment/Plan:   63 year old male POD5 LEFT ILIAC THROMBECTOMY, POSTERIOR TIBIAL AND DORSALIS PEDAL THROMBECTOMY WITH PATCH ANGIOPLASTY, 4 COMPARTMENT FASCIOTOMY, LEFT LEG ANGIOGRAPHY.  Patient tolerated procedure well.  Patient's pain currently remains well-controlled on PCA pump.  He is still unable to move his foot and feel his calf.  He can feel his thigh.  Dr. Felizardo spoke with him at the bedside and recommended a guillotine below-knee amputation possible above-knee amputation pending what the tissue looks like in the OR patient was understanding and wishes to proceed tomorrow.      - Vascular: OR tomorrow for left lower extremity guillotine below-knee amputation possible above-knee amputation with Dr. Felizardo  -N.p.o. postmidnight  -Hemoglobin 8.7: Resume heparin  GTT  -Continue to monitor H&H  -Appreciate critical care assistance with patient  -Appreciate nephrology's assistance  -Wound care per bedside nurse: Wet-to-dry  dressing changes daily for left lower extremity.  -Continue to monitor H&H  - Diet: Advance as tolerated  - Pain: Continue multimodal pain control  - Continue Zosyn    Plan and case per Dr.  Felizardo     Electronically signed by:   Mitzie FORBES All, PA-C  09/18/2024  11:26 AM    "

## 2024-09-18 NOTE — Progress Notes (Signed)
 "[facility]--[facility]        Hospitalist Progress Note    Patient Identifier/Hospitalist     Patient Name: Joe Avery DOB: 31-Oct-1961      I saw and examined the patient at 11:49 AM on 09/18/2024 in [id number]     Hospitalist: Emery Rav, MD    Disposition/Assessment and Plan     Disposition: Patient plan to discharge to Assisted Living, LTAC, or Rehab on Sep 24, 2024       Reason for continued hospitalization: Awaiting testing, labs, cultures, procedure or imaging BKA, Signs/Symptoms that persist Pain in LE, and Clearance from consultants Vascular surgery    Assessment and Plan :  Joe Avery is a 63 year old male, with a history of hypertension, hyperlipidemia, type 2 diabetes who was admitted to the critical care team following 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   Admission Date: 09/13/2024   Hospital day# 5    Critical limb ischemia-from occluded left femoral artery, status post left iliac thrombectomy with posterior tibial and dorsalis pedis thrombectomy with patch angioplasty, vascular surgery following, appreciate recommendations. continue on heparin  drip. Procedure planned tomorrow - BKA. Npo midnight. Zosyn in place.     2.  Acute renal failure superimposed on CKD stage IIIa-nephrology following, currently on dialysis, baseline creatinine was around 1.4 prior to admission. Avoid nephrotoxic medications, renally dose medications as needed.     3.  Compartment syndrome of left lower extremity-status post 4 compartment fasciotomy, continue monitoring wounds, vascular surgery following. Pain regimen in place, adjust as needed.     4.  Hypertension-now off pressors, monitor BP     5.  Acute blood loss anemia-likely secondary to multiple procedures possibly complicated by low erythropoietin production due to acute kidney failure, continue monitoring CBC. Hgb TID trend. Transfuse as needed, maintain Hgb > 7.     6.  Thrombocytopenia -continue monitoring cbc daily     7.   Septic shock-secondary to lower extremity ischemia with necrotic limb and sepsis related organ dysfunction, currently off pressors and extubated. Continue to monitor for signs of infection, adjust antibiotics as needed. Zosyn in place.     8.  Acute rhabdomyolysis-CK level improving. IVF in place, Nephrology consulted, appreciate recommendations. Trend CK daily.     9.  Hyperlipidemia-hold statin     10.  Type 2 diabetes-Glucose well-controlled, hemoglobin A1c 10.0 on admission. POC glucose, adjust regimen as needed. ISS in place.                                    DVT Prophylaxis: Heparin     Code Status: Orders Placed This Encounter      Total Support     Diagnostic Data:  Labs, reviewed:  Recent Results (from the past 24 hours)   POC GLUCOSE    Collection Time: 09/17/24  5:54 PM   Result Value Ref Range    POC Glucose 77 70 - 99 mg/dL    Scan Result     HEPARIN  APTT    Collection Time: 09/17/24  7:27 PM   Result Value Ref Range    Heparin  aPTT 121.1 (HH) 74.0 - 110.0 Sec   POC GLUCOSE    Collection Time: 09/17/24  8:26 PM   Result Value Ref Range    POC Glucose 123 (H) 70 - 99 mg/dL    Scan Result     POC GLUCOSE  Collection Time: 09/18/24  1:10 AM   Result Value Ref Range    POC Glucose 79 70 - 99 mg/dL    Scan Result     BASIC METABOLIC PANEL    Collection Time: 09/18/24  2:04 AM   Result Value Ref Range    Sodium 145 135 - 148 mEq/L    Potassium 4.3 3.4 - 5.3 mEq/L    Chloride 106 96 - 110 mEq/L    Carbon Dioxide 28 19 - 32 mEq/L    BUN 28 3 - 29 mg/dL    Creatinine 3.5 (H) 0.5 - 1.4 mg/dL    Glucose 70 70 - 99 mg/dL    Calcium  7.5 (L) 8.5 - 10.5 mg/dL    Anion Gap 11 5 - 15    BUN/CREAT Ratio 8 7 - 25    Estimated GFR 19 (L) >=60 mL/min/1.55m*2   MAGNESIUM , SERUM    Collection Time: 09/18/24  2:04 AM   Result Value Ref Range    Magnesium  1.8 1.4 - 2.5 mg/dL   COMPLETE BLOOD COUNT    Collection Time: 09/18/24  2:04 AM   Result Value Ref Range    WBC Count 9.4 3.5 - 10.9 K/uL    RBC 2.36 (L) 4.14 - 5.80 M/uL     Hemoglobin 6.9 (LL) 13.0 - 17.7 g/dL    Hematocrit 79.6 (L) 37.5 - 51.0 %    MCV 86.0 80.0 - 100.0 fL    MCH 29.2 26.0 - 34.0 pg    MCHC 34.0 30.7 - 35.5 g/dL    RDW 84.6 (H) <=84.9 %    Platelet Count 112 (L) 140 - 400 K/uL    MPV 12.1 (H) 7.2 - 11.7 fL    nRBC 0 <=0 /100 WBCs    Scan Result     CK (CPK), TOTAL    Collection Time: 09/18/24  2:04 AM   Result Value Ref Range    CK >22,000 (H) 0 - 250 U/L   HEPARIN  APTT    Collection Time: 09/18/24  2:04 AM   Result Value Ref Range    Heparin  aPTT 46.3 (LL) 74.0 - 110.0 Sec   PREPARE RED BLOOD CELLS    Collection Time: 09/18/24  3:00 AM   Result Value Ref Range    UNIT PRODUCT CODE E0336V00     UNIT ID [id number]     UNIT ABO A     UNIT RH NEGATIVE     UNIT INTERPRETATION Compatible     UNIT DISPENSE STATUS Crossmatched     UNIT EXPIRATION DATE [id number]     UNIT BLOOD TYPE 0600     BLOOD CODING SYS ISBT 128    POC GLUCOSE    Collection Time: 09/18/24  4:36 AM   Result Value Ref Range    POC Glucose 87 70 - 99 mg/dL    Scan Result     POC GLUCOSE    Collection Time: 09/18/24  8:29 AM   Result Value Ref Range    POC Glucose 105 (H) 70 - 99 mg/dL    Scan Result     HEPARIN  APTT    Collection Time: 09/18/24  9:58 AM   Result Value Ref Range    Heparin  aPTT 52.7 (LL) 74.0 - 110.0 Sec   HEMOGLOBIN AND HEMATOCRIT    Collection Time: 09/18/24  9:58 AM   Result Value Ref Range    Hemoglobin 8.7 (L) 13.0 - 17.7 g/dL    Hematocrit 73.9 (L)  37.5 - 51.0 %    Scan Result     POC GLUCOSE    Collection Time: 09/18/24 11:31 AM   Result Value Ref Range    POC Glucose 122 (H) 70 - 99 mg/dL    Scan Result       Patient does not have new imaging results since 0700 yesterday.   Subjective   The patient seen at bedside this morning. Patient mentions ongoing intermittent pain in LLE foot with some numbness, tingling. No pain anywhere else, nausea, vomiting, fevers, chills.    Objective Data   Vital Signs:  BP 117/86   Pulse 84   Temp 98.5 F (36.9 C)   Resp 10   Ht 1.753 m  (5' 9)   Wt 68.1 kg (150 lb 2.1 oz)   SpO2 93%   BMI 22.17 kg/m    I's and O's:    Intake/Output Summary (Last 24 hours) at 09/18/2024 1149  Last data filed at 09/18/2024 0700  Gross per 24 hour   Intake 1109.82 ml   Output 2044 ml   Net -934.18 ml      Baseline Weight: 50.9 kg (112 lb 3.4 oz) (09/13/24 1900)   Most recent Weight: 68.1 kg (150 lb 2.1 oz) (09/18/24 0600)   Last Bowel Movement: 09/17/24 (09/17/24 2000)       Physical Examination:  Constitutional: No acute distress, Non-toxic appearance  HENT: Normocephalic, Atraumatic  Eyes:   Conjunctiva normal, No discharge.  Neck: Normal range of motio, No tenderness, Supple, No lymphadenopathy, No stridor.  Cardiovascular:  Normal heart rate, Normal rhythm, No murmurs, No rubs, No gallops.  Pulmonary/Chest:  Normal breath sounds, No respiratory distress, No wheezing, No  chest tenderness  Abdomen:  Bowel sounds normal, Soft, No tenderness, No masses, No pulsatile masses  Back:  No tenderness, No CVA tenderness  Extremities:  Normal range of motion, Intact distal pulses, No edema anywhere else, No tenderness anywhere else. 2+ edema to left lower extremity, tenderness on palpation.  Neurologic:  Alert & oriented x 3  Skin:  Warm, Dry, No erythema, No rash anywhere else.  Left lower extremity has multiple bullae present to the left foot.  Psychiatric:  Aandffect normal, Judgement normal, Mood normal      Medications        Current Medications[1]         Signature     Electronically signed by: Emery Rav, MD, 09/18/2024 11:49 AM            [1]  Current Facility-Administered Medications   Medication Dose Route Frequency Provider Last Rate Last Admin    lactated ringers  parenteral solution   Intravenous Continuous Rav Emery, MD 100 mL/hr at 09/18/24 0851 New Bag at 09/18/24 0851    heparin  25,000 units in 0.45% NaCl 250 ml infusion  1-25 Units/kg/hr Intravenous Continuous Kobman, Chelsea E, PA-C        heparin  (porcine) 5,000 unit/ml injection  (anti-Xa monitoring)  25 Units/kg/dose IV Push PRN Kobman, Chelsea E, PA-C        Or    heparin  (porcine) 5,000 unit/ml injection (anti-Xa monitoring)  50 Units/kg/dose IV Push PRN Kobman, Chelsea E, PA-C        gabapentin  (NEURONTIN ) capsule 100 mg  100 mg Oral Daily PRN Whitlatch, Prentice Rush, PA-C        ondansetron  (ZOFRAN ) injection 4 mg  4 mg IV Push Q6H PRN Whitlatch, Prentice Rush, PA-C        Or  ondansetron  (ZOFRAN  ODT) RAPID DISSOLVING tablet 4 mg  4 mg Oral Q6H PRN Whitlatch, Prentice Rush, PA-C        flumazeniL (ROMAZICON) injection 0.2 mg  0.2 mg IV Push Q1Min PRN Hope Netter T, PA-C        naloxone [facility]) injection solution 0.4 mg  0.4 mg IV Push PRN Hope Netter T, PA-C        naloxone (NARCAN) DILUTION injection 0.1 mg  0.1 mg IV Push Q2Min PRN Hope Netter T, PA-C        nalbuphine (NUBAIN) injection solution 2 mg  2 mg IV Push Q4H PRN Walsh, Nicholas T, PA-C        saline flush  10 mL IV Push PRN Hope Netter T, PA-C        NaCl 0.9% 1,000 mL  1,000 mL Intravenous Continuous PRN Hope Netter T, PA-C        HYDROmorphone  (DILAUDID ) PCA 6 mg/30 mL (Low Dose)   Intravenous Continuous Hope Netter DASEN, PA-C   New Bag at 09/18/24 1032    albuterol  (PROVENTIL ) 2.5 mg /3 mL (0.083 %) inhalation solution 2.5 mg  2.5 mg Inhalation PRN Patria Fairy Mungo, MD        heparin  catheter solution   Instill PRN Stidham, Sydney B, APRN   8,000 Units at 09/16/24 1350    heparin  injection 5,000 Units  5,000 Units Instill To Dialysis-PRN Eduafo, Augustus K, MD   15,500 Units at 09/17/24 1434    oxyCODONE (ROXICODONE) IR tablet 5 mg  5 mg Oral Q4H PRN Carlson, Stephanie K, APRN   5 mg at 09/17/24 9092    Or    oxyCODONE (ROXICODONE) IR tablet 10 mg  10 mg Oral Q4H PRN Carlson, Stephanie K, APRN   10 mg at 09/18/24 1123    dextrose  (GLUTOSE) gel 15 g Carb  15 g Carb Oral PRN Nicholaus Cathryne Murray, APRN        glucagon  injection 1 mg  1 mg Subcutaneous PRN Davis, Deanna Kay, APRN         insulin  glargine (LANTUS ) Subcutaneous INJ 10 Units  10 Units Subcutaneous Daily at 1200 Nicholaus Cathryne Murray, APRN   10 Units at 09/18/24 1036    insulin  lispro (HumaLOG ) injection 1-9 Units  1-9 Units Subcutaneous Q4H Nicholaus Cathryne Murray, APRN        mupirocin (BACTROBAN) 2 % topical ointment   Nasal BID Laurent, Sandra K, APRN   Given at 09/17/24 2028    polyethylene glycol (MIRALAX ) packet 17 g  1 Packet Oral Daily Laurent, Sandra K, APRN   17 g at 09/16/24 0908    bisacodyL (DULCOLAX) enteric-coated tablet 5 mg  5 mg Oral Daily PRN Laurent, Sandra K, APRN        Or    bisacodyL (DULCOLAX) suppository 10 mg  10 mg Rectal Daily PRN Laurent, Sandra K, APRN       "

## 2024-09-19 NOTE — Progress Notes (Signed)
 "   Critical Care Progress Note  Joe Avery  Date of Service: 09/19/2024    Joe Avery  DOB: December 26, 1960   09/19/2024  9:21 AM    Assessment:  Joe Avery is a 63yo male with PMHx of HTN, DMII, CKDIII who presented with LE pain/paresthesia 2/2 femoral artery occlusion now s/p LLE angioplasty/thrombectomy (11/3, [facility]) c/b compartment syndrome with associated AHRF, AKI and rhabdomyolysis now s/p 4 compartment fasciotomy with Vascular Surgery on 09/13/2024.     Problem list:  Ischemic Limb, left lower extremity 2/2 femoral artery occlusion s/p angioplasty/thrombectomy with subsequent course c/b compartment syndrome necessitating emergent 4-compartment fasciotomy with Vascular Surgery on 09/13/2024  Thrombectomy/Angioplasty 11/3 at [facility]  4 compartment fasciotomy 11/4 at [facility]  Sepsis due to lower extremity ischemia, necrotic limb, with acute sepsis-related organ dysfunction, acute hypoxic respiratory failure as evidenced by SPO2 < 89% on RM air, acute kidney injury as evidenced by elevated creatinine 5.5 from baseline creatinine 1.1, and septic shock requiring vasopressors to maintain MAP > 65.  Present on Admission (POA).    Acute Hypoxic Respiratory Failure as evidenced by, ABG PO2 on room air < 60, SpO2 < 89% on room air, and necessitating intubation  with clinical indicators, shortness of breath and use of accessory muscles.  Present on Admission (POA)  Mechanical Ventilation, resolved: Extubated 09/16/2024  AKI on CKD, now on RRT  Acute Rhabdomyolysis  CK level improving, still >22,000  Hyperglycemia  Leukocytosis  Anemia, normocytic  Thrombocytopenia, improving  Chronic Conditions: HTN, HTN, DMII     Plan:  Hemodynamically stable  De-escalated from 2L to RA with no significant issues with saturations  Awaiting tentative OR plans today for LLE BKA (possible AKA)  Currently on heparin  infusion per vascular surgery.  Hgb 7.9 (from 6.9 yesterday).  No evidence of  respiratory distress  Currently on Zosyn  Timing of HD/UF per nephrology  Continue pain management  Will continue to follow after surgery  Discussed with bedside RN    This patient is high risk due to their chronic illness with severe exacerbation, progression, or side effects of treatment and their acute illness that poses a threat to life or bodily function.      -----    Chief Complaint/Reason for Admission:  Shock in setting of compartment syndrome s/p fasciotomy secondary to LLE venous thrombosis s/p thrombectomy     Pertinent interval/overnight events:  Continues to report intermittent pain in his left leg  Minimal O2 supplementation.  No respiratory complaints.    Physical Examination:  BP 121/69   Pulse 90   Temp 98 F (36.7 C)   Resp 13   Ht 1.753 m (5' 9)   Wt 69.8 kg (153 lb 14.1 oz)   SpO2 99%   BMI 22.72 kg/m         RESPIRATORY -  No respiratory distress, Clear to auscultation   CARDIAC -  Regular rate and rhythm,No gallop  ABDOMEN -  Soft, non-tender.  EXTREMITIES -marked swelling erythema and cyanosis on the left leg  NEUROLOGICAL -  alert, moves all extremities    Other pertinent exam findings:  Body mass index is 22.72 kg/m.    Laboratory Results reviewed BMP, CBC, CPK levels downtrending  Radiography [personally reviewed, along with the Radiologist's reports (if available)] notable for:    LLE Arterial US , 09/15/2024  1. Heterogeneous intramuscular fluid collections within the left groin, could reflect developing ischemic myonecrosis changes. Contrast MRI would best be  further evaluate.  2. Patent left external iliac and common femoral arteries, both with elevated artery velocities suggesting moderate luminal stenosis. The distal left SFA is patent.      TTE, 09/12/2024    Image quality is poor, patient screaming in pain during exam.     Left Ventricle: Normal left ventricular systolic function with a   visually estimated EF of 55 - 60%. Left ventricle is smaller than normal.   Normal  wall thickness.  LVIDd is 3.1 cm. Normal wall motion.     Left Atrium: Left atrium is smaller than normal.     Right Ventricle: Normal systolic function.     Aortic Valve: Individual aortic valve leaflets not well visualized.     Pericardium: No pericardial effusion.     Electronically signed by: Joe LELON Purpura, DO, 09/19/2024 9:21 AM     "

## 2024-09-19 NOTE — Progress Notes (Signed)
 "[facility]        Hospitalist Progress Note    Patient Identifier/Hospitalist     Patient Name: Joe Avery DOB: 1961/08/13      I saw and examined the patient at 11:24 AM on 09/19/2024 in [id number]     Hospitalist: Emery Rav, MD    Disposition/Assessment and Plan     Disposition: Patient plan to discharge to Assisted Living, LTAC, or Rehab on Sep 24, 2024        Reason for continued hospitalization: Awaiting testing, labs, cultures, procedure or imaging procedure BKA, Signs/Symptoms that persist LE pain, and Clearance from consultants Vascular surgery    Assessment and Plan :  Joe Avery is a 63 year old male, with a history of hypertension, hyperlipidemia, type 2 diabetes who was admitted to the critical care team following 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   Admission Date: 09/13/2024   Hospital day# 6    Critical limb ischemia-from occluded left femoral artery, status post left iliac thrombectomy with posterior tibial and dorsalis pedis thrombectomy with patch angioplasty, vascular surgery following, appreciate recommendations. continue on heparin  drip. Procedure planned - BKA. Npo midnight. Zosyn in place. PT/OT after procedure.     2.  Acute renal failure superimposed on CKD stage IIIa  Acute rhabdomyolysis -nephrology following, currently on dialysis, baseline creatinine was around 1.4 prior to admission. Avoid nephrotoxic medications, renally dose medications as needed.Trend Ck daily. IVF. HD planned tomorrow.     3.  Compartment syndrome of left lower extremity-status post 4 compartment fasciotomy, continue monitoring wounds, vascular surgery following. Pain regimen in place, adjust as needed.     4.  Hypertension-now off pressors, monitor BP     5.  Acute blood loss anemia-likely secondary to multiple procedures possibly complicated by low erythropoietin production due to acute kidney failure, continue monitoring CBC. Hgb TID trend. Transfuse as  needed, maintain Hgb > 7. Heparin  drip is in place.     6.  Thrombocytopenia -continue monitoring cbc daily     7.  Septic shock-secondary to lower extremity ischemia with necrotic limb and sepsis related organ dysfunction, currently off pressors and extubated. Continue to monitor for signs of infection, adjust antibiotics as needed. Zosyn in place.     8.  Hyperlipidemia-hold statin     9.  Type 2 diabetes-Glucose well-controlled, hemoglobin A1c 10.0 on admission. POC glucose, adjust regimen as needed. ISS in place.                               DVT Prophylaxis: Heparin     Code Status: Orders Placed This Encounter      Total Support     Diagnostic Data:  Labs, reviewed:  Recent Results (from the past 24 hours)   POC GLUCOSE    Collection Time: 09/18/24 11:31 AM   Result Value Ref Range    POC Glucose 122 (H) 70 - 99 mg/dL    Scan Result     PROTHROMBIN TIME    Collection Time: 09/18/24 11:48 AM   Result Value Ref Range    Prothrombin Time 13.0 11.7 - 13.9 Sec    INR 1.0 0.9 - 1.1   ACTIVATED PARTIAL THROMBOPLASTIN TIME    Collection Time: 09/18/24 11:48 AM   Result Value Ref Range    APTT 56.9 (H) 24.5 - 35.2 Sec   ANTI-FACTOR XA UFH    Collection Time: 09/18/24 11:48 AM  Result Value Ref Range    ANTI-FACTOR XA UFH 0.23 (LL) 0.30 - 0.70 IU/ml   COMPLETE BLOOD COUNT    Collection Time: 09/18/24 11:48 AM   Result Value Ref Range    WBC Count 11.1 (H) 3.5 - 10.9 K/uL    RBC 3.09 (L) 4.14 - 5.80 M/uL    Hemoglobin 8.9 (L) 13.0 - 17.7 g/dL    Hematocrit 73.0 (L) 37.5 - 51.0 %    MCV 87.1 80.0 - 100.0 fL    MCH 28.8 26.0 - 34.0 pg    MCHC 33.1 30.7 - 35.5 g/dL    RDW 84.6 (H) <=84.9 %    Platelet Count 137 (L) 140 - 400 K/uL    MPV 12.5 (H) 7.2 - 11.7 fL    nRBC 0 <=0 /100 WBCs    Scan Result     PREPARE RED BLOOD CELLS    Collection Time: 09/18/24 12:05 PM   Result Value Ref Range    UNIT PRODUCT CODE [id number]     UNIT ID [id number]     UNIT ABO A     UNIT RH NEGATIVE     UNIT INTERPRETATION Compatible     UNIT  DISPENSE STATUS Crossmatched     UNIT EXPIRATION DATE [id number]     UNIT BLOOD TYPE 0600     BLOOD CODING SYS ISBT 128    POC GLUCOSE    Collection Time: 09/18/24  5:06 PM   Result Value Ref Range    POC Glucose 79 70 - 99 mg/dL    Scan Result     HEMOGLOBIN AND HEMATOCRIT    Collection Time: 09/18/24  7:49 PM   Result Value Ref Range    Hemoglobin 8.2 (L) 13.0 - 17.7 g/dL    Hematocrit 75.1 (L) 37.5 - 51.0 %    Scan Result     ANTI-FACTOR XA UFH    Collection Time: 09/18/24  7:49 PM   Result Value Ref Range    ANTI-FACTOR XA UFH 0.29 (LL) 0.30 - 0.70 IU/ml   POC GLUCOSE    Collection Time: 09/18/24  8:24 PM   Result Value Ref Range    POC Glucose 55 (L) 70 - 99 mg/dL    Scan Result     POC GLUCOSE    Collection Time: 09/18/24  8:46 PM   Result Value Ref Range    POC Glucose 59 (L) 70 - 99 mg/dL    Scan Result     POC GLUCOSE    Collection Time: 09/18/24  9:19 PM   Result Value Ref Range    POC Glucose 86 70 - 99 mg/dL    Scan Result     POC GLUCOSE    Collection Time: 09/18/24  9:42 PM   Result Value Ref Range    POC Glucose 94 70 - 99 mg/dL    Scan Result     POC GLUCOSE    Collection Time: 09/19/24 12:43 AM   Result Value Ref Range    POC Glucose 142 (H) 70 - 99 mg/dL    Scan Result     BASIC METABOLIC PANEL    Collection Time: 09/19/24  3:07 AM   Result Value Ref Range    Sodium 140 135 - 148 mEq/L    Potassium 4.5 3.4 - 5.3 mEq/L    Chloride 103 96 - 110 mEq/L    Carbon Dioxide 26 19 - 32 mEq/L    BUN 36 (H) 3 - 29  mg/dL    Creatinine 4.8 (H) 0.5 - 1.4 mg/dL    Glucose 898 (H) 70 - 99 mg/dL    Calcium  7.8 (L) 8.5 - 10.5 mg/dL    Anion Gap 11 5 - 15    BUN/CREAT Ratio 8 7 - 25    Estimated GFR 13 (L) >=60 mL/min/1.36m*2   MAGNESIUM , SERUM    Collection Time: 09/19/24  3:07 AM   Result Value Ref Range    Magnesium  2.0 1.4 - 2.5 mg/dL   CK (CPK), TOTAL    Collection Time: 09/19/24  3:07 AM   Result Value Ref Range    CK 15,037 (H) 0 - 250 U/L   ANTI-FACTOR XA UFH    Collection Time: 09/19/24  3:07 AM   Result  Value Ref Range    ANTI-FACTOR XA UFH 0.46 0.30 - 0.70 IU/ml   PHOSPHORUS    Collection Time: 09/19/24  3:07 AM   Result Value Ref Range    Phosphorus 4.7 (H) 2.5 - 4.5 mg/dL   COMPLETE BLOOD COUNT WITH DIFFERENTIAL    Collection Time: 09/19/24  3:07 AM   Result Value Ref Range    WBC Count 9.9 3.5 - 10.9 K/uL    RBC 2.70 (L) 4.14 - 5.80 M/uL    Hemoglobin 7.9 (L) 13.0 - 17.7 g/dL    Hematocrit 75.6 (L) 37.5 - 51.0 %    MCV 90.0 80.0 - 100.0 fL    MCH 29.3 26.0 - 34.0 pg    MCHC 32.5 30.7 - 35.5 g/dL    RDW 84.6 (H) <=84.9 %    Platelet Count 144 140 - 400 K/uL    MPV 11.7 7.2 - 11.7 fL    nRBC 0 <=0 /100 WBCs    % Neutrophils 76.5 42.0 - 80.0 %    % Lymphocytes 7.6 (L) 14.0 - 51.0 %    % Monocytes 11.1 4.0 - 12.0 %    % Eosinophils 2.7 0.0 - 5.0 %    % Basophils 0.4 0.0 - 2.0 %    % Immature Granulocyte 1.7 (H) <1.0 %    Absolute Neutrophils 7.5 1.8 - 7.5 K/uL    Absolute Lymphocyte 0.8 (L) 0.9 - 4.1 K/uL    Absolute Monocyte 1.1 (H) 0.2 - 1.0 K/uL    Absolute Eosinophils 0.3 0.0 - 0.5 K/uL    Absolute Basophil 0.0 0.0 - 0.3 K/uL    Absolute Immature Granulocytes 0.2 (H) 0.0 - 0.1 K/uL   POC GLUCOSE    Collection Time: 09/19/24  4:47 AM   Result Value Ref Range    POC Glucose 88 70 - 99 mg/dL    Scan Result     POC GLUCOSE    Collection Time: 09/19/24  8:28 AM   Result Value Ref Range    POC Glucose 73 70 - 99 mg/dL    Scan Result     ANTI-FACTOR XA UFH    Collection Time: 09/19/24  9:15 AM   Result Value Ref Range    ANTI-FACTOR XA UFH 0.27 (LL) 0.30 - 0.70 IU/ml   HEMOGLOBIN AND HEMATOCRIT    Collection Time: 09/19/24  9:15 AM   Result Value Ref Range    Hemoglobin 8.3 (L) 13.0 - 17.7 g/dL    Hematocrit 75.1 (L) 37.5 - 51.0 %    Scan Result       Patient does not have new imaging results since 0700 yesterday.   Subjective   The patient seen today at  bedside. Mentions ongoing pain in LLE. NC in place. No sob, pain anywhere else, fevers.    Objective Data   Vital Signs:  BP 119/81   Pulse 98   Temp 98 F (36.7  C)   Resp (!) 30   Ht 1.753 m (5' 9)   Wt 69.8 kg (153 lb 14.1 oz)   SpO2 94%   BMI 22.72 kg/m    I's and O's:    Intake/Output Summary (Last 24 hours) at 09/19/2024 1124  Last data filed at 09/19/2024 1000  Gross per 24 hour   Intake 3238.14 ml   Output 90 ml   Net 3148.14 ml      Baseline Weight: 50.9 kg (112 lb 3.4 oz) (09/13/24 1900)   Most recent Weight: 69.8 kg (153 lb 14.1 oz) (09/19/24 0600)   Last Bowel Movement: 09/18/24 (09/19/24 0800)       Physical Examination:  Constitutional: No acute distress, Non-toxic appearance  HENT: Normocephalic, Atraumatic  Cardiovascular:  Normal heart rate, Normal rhythm, No murmurs, No rubs, No gallops.  Pulmonary/Chest:  Normal breath sounds, No respiratory distress, No wheezing, No  chest tenderness  Abdomen:  Bowel sounds normal, Soft, No tenderness, No masses, No pulsatile masses  Back:  No tenderness, No CVA tenderness  Extremities:  Normal range of motion, Intact distal pulses, No edema anywhere else, No tenderness anywhere else. 2+ edema to left lower extremity, tenderness on palpation.  Neurologic:  Alert & oriented x 3  Skin:  Warm, Dry, No erythema, No rash anywhere else.  Left lower extremity has multiple bullae present to the left foot.    Medications        Current Medications[1]         Signature     Electronically signed by: Emery Rav, MD, 09/19/2024 11:24 AM            [1]  Current Facility-Administered Medications   Medication Dose Route Frequency Provider Last Rate Last Admin    lactated ringers  parenteral solution   Intravenous Continuous Rav Emery, MD 100 mL/hr at 09/19/24 0700 Rate Verify at 09/19/24 0700    heparin  25,000 units in 0.45% NaCl 250 ml infusion  1-25 Units/kg/hr Intravenous Continuous Kobman, Chelsea E, PA-C 10.9 mL/hr at 09/19/24 0700 16 Units/kg/hr at 09/19/24 0700    heparin  (porcine) 5,000 unit/ml injection (anti-Xa monitoring)  25 Units/kg/dose IV Push PRN Kobman, Chelsea E, PA-C        Or    heparin   (porcine) 5,000 unit/ml injection (anti-Xa monitoring)  50 Units/kg/dose IV Push PRN Kobman, Chelsea E, PA-C        piperacillin-tazobactam (ZOSYN) 3.375 g in 0.9% NaCl 50 mL addEASE  3.375 g Intravenous Q8H Rav Emery, MD 12.5 mL/hr at 09/19/24 0700 Rate Verify at 09/19/24 0700    gabapentin  (NEURONTIN ) capsule 100 mg  100 mg Oral Daily PRN Whitlatch, Prentice Rush, PA-C        ondansetron  (ZOFRAN ) injection 4 mg  4 mg IV Push Q6H PRN Whitlatch, Prentice Rush, PA-C        Or    ondansetron  (ZOFRAN  ODT) RAPID DISSOLVING tablet 4 mg  4 mg Oral Q6H PRN Whitlatch, Prentice Rush, PA-C        flumazeniL (ROMAZICON) injection 0.2 mg  0.2 mg IV Push Q1Min PRN Hope Netter T, PA-C        naloxone [facility]) injection solution 0.4 mg  0.4 mg IV Push PRN Hope Netter DASEN, PA-C  naloxone (NARCAN) DILUTION injection 0.1 mg  0.1 mg IV Push Q2Min PRN Hope Netter T, PA-C        nalbuphine (NUBAIN) injection solution 2 mg  2 mg IV Push Q4H PRN Walsh, Nicholas T, PA-C        saline flush  10 mL IV Push PRN Hope Netter T, PA-C        NaCl 0.9% 1,000 mL  1,000 mL Intravenous Continuous PRN Hope Netter T, PA-C        HYDROmorphone  (DILAUDID ) PCA 6 mg/30 mL (Low Dose)   Intravenous Continuous Hope Netter DASEN, PA-C   New Bag at 09/19/24 1028    albuterol  (PROVENTIL ) 2.5 mg /3 mL (0.083 %) inhalation solution 2.5 mg  2.5 mg Inhalation PRN Patria Fairy Mungo, MD        heparin  catheter solution   Instill PRN Stidham, Sydney B, APRN   8,000 Units at 09/16/24 1350    heparin  injection 5,000 Units  5,000 Units Instill To Dialysis-PRN Eduafo, Augustus K, MD   15,500 Units at 09/17/24 1434    oxyCODONE (ROXICODONE) IR tablet 5 mg  5 mg Oral Q4H PRN Carlson, Stephanie K, APRN   5 mg at 09/17/24 9092    Or    oxyCODONE (ROXICODONE) IR tablet 10 mg  10 mg Oral Q4H PRN Carlson, Stephanie K, APRN   10 mg at 09/18/24 1123    dextrose  (GLUTOSE) gel 15 g Carb  15 g Carb Oral PRN Nicholaus Cathryne Murray, APRN   15 g  Carb at 09/18/24 2028    glucagon  injection 1 mg  1 mg Subcutaneous PRN Davis, Deanna Kay, APRN   1 mg at 09/18/24 2058    insulin  glargine (LANTUS ) Subcutaneous INJ 10 Units  10 Units Subcutaneous Daily at 1200 Nicholaus Cathryne Murray, APRN   10 Units at 09/18/24 1036    insulin  lispro (HumaLOG ) injection 1-9 Units  1-9 Units Subcutaneous Q4H Nicholaus Cathryne Murray, APRN   1 Units at 09/19/24 0050    polyethylene glycol (MIRALAX ) packet 17 g  1 Packet Oral Daily Laurent, Sandra K, APRN   17 g at 09/16/24 0908    bisacodyL (DULCOLAX) enteric-coated tablet 5 mg  5 mg Oral Daily PRN Laurent, Sandra K, APRN        Or    bisacodyL (DULCOLAX) suppository 10 mg  10 mg Rectal Daily PRN Laurent, Sandra K, APRN       "

## 2024-09-21 NOTE — Progress Notes (Signed)
 "[facility]--[facility]        Hospitalist Progress Note    Patient Identifier/Hospitalist     Patient Name: Joe Avery DOB: 10-25-1961      I saw and examined the patient on 09/21/2024 in MPACU/PACU-22     Hospitalist: Carvel SHAUNNA Dean, MD    Disposition/Assessment and Plan     Disposition: Patient was admitted to the hospital from Home and plan to discharge to [facility] when medically ready       Reason for continued hospitalization: Vascular surgery recs, nephrology recs, ICU recommendations    Assessment and Plan :  Joe Avery is a 63 year old male, with a history of hypertension, hyperlipidemia, type 2 diabetes  who presented to the ED with occlusion of left femoral artery.   Admission Date: 09/13/2024   Hospital day# 8    Critical limb ischemia  -From occluded left femoral artery, status post left iliac thrombectomy with posterior tibial and dorsalis pedis thrombectomy with patch angioplasty, vascular surgery following, appreciate recommendations. 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  Compartment syndrome of left lower extremity  -status post 4 compartment  fasciotomy, continue monitoring wounds, vascular surgery following. Pain regimen in place, adjust as needed.     4.  Acute blood loss anemia  -Likely secondary to multiple procedures possibly complicated by low erythropoietin production due to acute kidney failure  - Hemoglobin at 6.5 this morning, received 2 units of PRBCs, will continue to monitor    5.  Septic shock  -Secondary to lower extremity ischemia with necrotic limb and sepsis related organ dysfunction, currently off pressors and extubated.   -Continue to monitor for signs of infection and adjust antibiotics as needed,  -Blood cultures have been no growth for 5 days and patient has been afebrile, will discontinue Zosyn    6.  Hyperlipidemia  - Hold statin due to elevated CK     7.  Type 2 diabetes  -On sliding scale currently, glucose 147 this afternoon  -Will continue to monitor when no longer n.p.o. if insulin  needs are increased                       * Acute Blood Loss Anemia Present on Admission (POA)  * Anemia of Chronic Kidney Disease Present on Admission (POA)  * Acute Kidney Injury Clinically unable to determine Present on Admission (POA) status               DVT Prophylaxis: Heparin     Code Status: Orders Placed This Encounter      Total Support     Diagnostic Data:  Labs, reviewed:  Recent Results (from the  past 24 hours)   HEPARIN  APTT    Collection Time: 09/20/24  3:54 PM   Result Value Ref Range    Heparin  aPTT >200.0 (HH) 74.0 - 110.0 Sec   POC GLUCOSE    Collection Time: 09/20/24  5:26 PM   Result Value Ref Range    POC Glucose 170 (H) 70 - 99 mg/dL    Scan Result     HEPARIN  APTT    Collection Time: 09/21/24  1:33 AM   Result Value Ref Range    Heparin  aPTT 139.1 (HH) 74.0 - 110.0 Sec   MAGNESIUM , SERUM    Collection Time: 09/21/24  5:08 AM   Result Value Ref Range    Magnesium  1.9 1.4 - 2.5 mg/dL   CK (CPK), TOTAL    Collection Time: 09/21/24  5:08 AM   Result Value Ref Range    CK 5,398 (H) 0 - 250 U/L   RENAL FUNCTION PANEL     Collection Time: 09/21/24  5:08 AM   Result Value Ref Range    Sodium 142 135 - 148 mEq/L    Potassium 4.3 3.4 - 5.3 mEq/L    Chloride 103 96 - 110 mEq/L    Carbon Dioxide 26 19 - 32 mEq/L    BUN 26 3 - 29 mg/dL    Creatinine 4.2 (H) 0.5 - 1.4 mg/dL    Glucose 810 (H) 70 - 99 mg/dL    Calcium  7.4 (L) 8.5 - 10.5 mg/dL    Albumin 2.2 (L) 3.5 - 5.2 g/dL    Phosphorus 4.3 2.5 - 4.5 mg/dL    Anion Gap 13 5 - 15    BUN/CREAT Ratio 6 (L) 7 - 25    Estimated GFR 15 (L) >=60 mL/min/1.27m*2   COMPLETE BLOOD COUNT    Collection Time: 09/21/24  5:08 AM   Result Value Ref Range    WBC Count 9.4 3.5 - 10.9 K/uL    RBC 2.23 (L) 4.14 - 5.80 M/uL    Hemoglobin 6.5 (LL) 13.0 - 17.7 g/dL    Hematocrit 79.6 (L) 37.5 - 51.0 %    MCV 91.0 80.0 - 100.0 fL    MCH 29.1 26.0 - 34.0 pg    MCHC 32.0 30.7 - 35.5 g/dL    RDW 84.1 (H) <=84.9 %    Platelet Count 237 140 - 400 K/uL    MPV 10.8 7.2 - 11.7 fL    nRBC 0 <=0 /100 WBCs    Scan Result     TYPE AND SCREEN    Collection Time: 09/21/24  5:08 AM   Result Value Ref Range    ABO Grouping A     Rh Type Negative     Antibody Screen Negative     Specimen Expiration Date/Time [id number]    PREPARE RED BLOOD CELLS    Collection Time: 09/21/24  5:50 AM   Result Value Ref Range    UNIT PRODUCT CODE E0336V00     UNIT ID [id number]     UNIT ABO A     UNIT RH NEGATIVE     UNIT INTERPRETATION Compatible     UNIT DISPENSE STATUS ISSUED     UNIT EXPIRATION DATE [id number]     UNIT BLOOD TYPE 0600     BLOOD CODING SYS ISBT 128    PREPARE RED BLOOD CELLS    Collection Time: 09/21/24  5:55 AM   Result Value Ref Range    UNIT PRODUCT CODE Z9663C99  UNIT ID [id number]     UNIT ABO A     UNIT RH NEGATIVE     UNIT INTERPRETATION Compatible     UNIT DISPENSE STATUS ISSUED     UNIT EXPIRATION DATE [id number]     UNIT BLOOD TYPE 0600     BLOOD CODING SYS ISBT 128    PREPARE RED BLOOD CELLS    Collection Time: 09/21/24  7:25 AM   Result Value Ref Range    UNIT PRODUCT CODE Z5466C99     UNIT ID  [id number]     UNIT ABO A     UNIT RH NEGATIVE     UNIT INTERPRETATION Compatible     UNIT DISPENSE STATUS ISSUED     UNIT EXPIRATION DATE [id number]     UNIT BLOOD TYPE 0600     BLOOD CODING SYS ISBT 128    POC GLUCOSE    Collection Time: 09/21/24  8:06 AM   Result Value Ref Range    POC Glucose 198 (H) 70 - 99 mg/dL    Scan Result     HEPARIN  APTT    Collection Time: 09/21/24  8:54 AM   Result Value Ref Range    Heparin  aPTT 62.8 (LL) 74.0 - 110.0 Sec   PREPARE RED BLOOD CELLS    Collection Time: 09/21/24 12:20 PM   Result Value Ref Range    UNIT PRODUCT CODE Z5472C99     UNIT ID [id number]     UNIT ABO A     UNIT RH NEGATIVE     UNIT INTERPRETATION Compatible     UNIT DISPENSE STATUS Crossmatched     UNIT EXPIRATION DATE [id number]     UNIT BLOOD TYPE 0600     BLOOD CODING SYS ISBT 128     UNIT PRODUCT CODE E4528V00     UNIT ID [id number]     UNIT ABO A     UNIT RH NEGATIVE     UNIT INTERPRETATION Compatible     UNIT DISPENSE STATUS ISSUED     UNIT EXPIRATION DATE [id number]     UNIT BLOOD TYPE 0600     BLOOD CODING SYS ISBT 128    POC GLUCOSE    Collection Time: 09/21/24 12:34 PM   Result Value Ref Range    POC Glucose 139 (H) 70 - 99 mg/dL    Scan Result     POC GLUCOSE    Collection Time: 09/21/24  2:55 PM   Result Value Ref Range    POC Glucose 147 (H) 70 - 99 mg/dL    Scan Result       Imaging results since 0700 yesterday reviewed.   Subjective   The patient was examined at bedside this afternoon s/p vascular surgery procedure.  He did not appear to be in acute distress but was somewhat lethargic after procedure.  He was hemodynamically stable on oxygen mask.    Objective Data   Vital Signs:  BP 133/71   Pulse 94   Temp 98.1 F (36.7 C)   Resp 13   Ht 1.753 m (5' 9)   Wt 71.2 kg (156 lb 15.5 oz)   SpO2 96%   BMI 23.18 kg/m    I's and O's:    Intake/Output Summary (Last 24 hours) at 09/21/2024 1540  Last data filed at 09/21/2024 1530  Gross per 24 hour   Intake 2279.66 ml    Output 170 ml   Net 2109.66 ml  Baseline Weight: 50.9 kg (112 lb 3.4 oz) (09/13/24 1900)   Most recent Weight: 71.2 kg (156 lb 15.5 oz) (09/21/24 0500)   Last Bowel Movement: 09/21/24 (09/21/24 0800)       Physical Examination:  Physical Exam  Constitutional:       General: He is not in acute distress.  HENT:      Head: Normocephalic.   Cardiovascular:      Rate and Rhythm: Normal rate and regular rhythm.   Pulmonary:      Effort: No respiratory distress.      Breath sounds: Normal breath sounds.   Abdominal:      General: There is no distension.      Palpations: Abdomen is soft.      Tenderness: There is no abdominal tenderness.   Musculoskeletal:      Comments: Right upper extremity with swelling, tightness but palpable pulses are present and no tenderness to palpation    Left lower extremity AKA   Neurological:      General: No focal deficit present.      Mental Status: He is oriented to person, place, and time.          Medications        Current Medications[1]         Signature     Electronically signed by: Carvel SHAUNNA Dean, MD, 09/21/2024 3:40 PM              [1]  Current Facility-Administered Medications   Medication Dose Route Frequency Provider Last Rate Last Admin    lactated ringers  parenteral solution   Intravenous Continuous Pradarelli, Jason C, MD 40 mL/hr at 09/21/24 1237 New Bag at 09/21/24 1237    insulin  glargine (LANTUS ) Subcutaneous INJ 14 Units  0.2 Units/kg/dose Subcutaneous Daily at 2100 Pradarelli, Jason C, MD        insulin  lispro (HumaLOG ) injection 1-5 Units  1-5 Units Subcutaneous Q4H Pradarelli, Jason C, MD   2 Units at 09/21/24 0841    heparin  25,000 units in 0.45% NaCl 250 ml infusion  1-25 Units/kg/hr Intravenous Continuous Pradarelli, Jason C, MD 7.5 mL/hr at 09/21/24 0800 10 Units/kg/hr at 09/21/24 0800    heparin  (porcine) 5,000 unit/ml injection (aPTT monitoring)  25 Units/kg/dose IV Push PRN Pradarelli, Jason C, MD        Or    heparin  (porcine) 5,000 unit/ml  injection (aPTT monitoring)  50 Units/kg/dose IV Push PRN Pradarelli, Jason C, MD        dextrose  50 % in water  (D50W) intravenous syringe 12.5 g  12.5 g IV Push Once PRN Pradarelli, Jason C, MD        gabapentin  (NEURONTIN ) capsule 100 mg  100 mg Oral Daily PRN Pradarelli, Jason C, MD   100 mg at 09/20/24 2344    ondansetron  (ZOFRAN ) injection 4 mg  4 mg IV Push Q6H PRN Pradarelli, Jason C, MD        Or    ondansetron  (ZOFRAN  ODT) RAPID DISSOLVING tablet 4 mg  4 mg Oral Q6H PRN Pradarelli, Jason C, MD        flumazeniL (ROMAZICON) injection 0.2 mg  0.2 mg IV Push Q1Min PRN Pradarelli, Jason C, MD        naloxone [facility]) injection solution 0.4 mg  0.4 mg IV Push PRN Pradarelli, Jason C, MD        naloxone (NARCAN) DILUTION injection 0.1 mg  0.1 mg IV Push Q2Min PRN Pradarelli, Jason C, MD  nalbuphine (NUBAIN) injection solution 2 mg  2 mg IV Push Q4H PRN Pradarelli, Jason C, MD        saline flush  10 mL IV Push PRN Pradarelli, Jason C, MD        NaCl 0.9% 1,000 mL  1,000 mL Intravenous Continuous PRN Pradarelli, Jason C, MD 10 mL/hr at 09/21/24 1530 Rate Verify at 09/21/24 1530    HYDROmorphone  (DILAUDID ) PCA 6 mg/30 mL (Low Dose)   Intravenous Continuous Pradarelli, Jason C, MD   Rate Verify at 09/21/24 1530    albuterol  (PROVENTIL ) 2.5 mg /3 mL (0.083 %) inhalation solution 2.5 mg  2.5 mg Inhalation PRN Pradarelli, Jason C, MD        heparin  catheter solution   Instill PRN Pradarelli, Jason C, MD   8,000 Units at 09/16/24 1350    heparin  injection 5,000 Units  5,000 Units Instill To Dialysis-PRN Pradarelli, Jason C, MD   15,500 Units at 09/20/24 1357    oxyCODONE (ROXICODONE) IR tablet 5 mg  5 mg Oral Q4H PRN Pradarelli, Jason C, MD   5 mg at 09/19/24 1140    Or    oxyCODONE (ROXICODONE) IR tablet 10 mg  10 mg Oral Q4H PRN Pradarelli, Jason C, MD   10 mg at 09/20/24 2344    dextrose  (GLUTOSE) gel 15 g Carb  15 g Carb Oral PRN Pradarelli, Jason C, MD   15 g Carb at 09/19/24 1605     glucagon  injection 1 mg  1 mg Subcutaneous PRN Pradarelli, Jason C, MD   1 mg at 09/18/24 2058    polyethylene glycol (MIRALAX ) packet 17 g  1 Packet Oral Daily Pradarelli, Jason C, MD   17 g at 09/16/24 0908    bisacodyL (DULCOLAX) enteric-coated tablet 5 mg  5 mg Oral Daily PRN Pradarelli, Jason C, MD        Or    bisacodyL (DULCOLAX) suppository 10 mg  10 mg Rectal Daily PRN Pradarelli, Jason C, MD       "

## 2024-09-21 NOTE — Progress Notes (Signed)
 "   Critical Care Progress Note  [facility]  Date of Service: 09/21/2024    Joe Avery  DOB: 27-Aug-1961   09/21/2024  7:53 AM    Assessment:  Joe Avery is a 63yo male with PMHx of HTN, DMII, CKDIII who presented with LE pain/paresthesia 2/2 femoral artery occlusion now s/p LLE angioplasty/thrombectomy (11/3, [facility]) c/b compartment syndrome with associated AHRF, AKI and rhabdomyolysis now s/p 4 compartment fasciotomy with Vascular Surgery on 09/13/2024. 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    Problem list:  Ischemic Limb, left lower extremity 2/2 femoral artery occlusion s/p angioplasty/thrombectomy with subsequent course c/b compartment syndrome necessitating emergent 4-compartment fasciotomy with Vascular Surgery on 09/13/2024  Thrombectomy/Angioplasty 11/3 at [facility]  4 compartment fasciotomy 11/4 at [facility]  S/P LLE BKA 11/10 at [facility]  Sepsis due to lower extremity ischemia, necrotic limb, with acute sepsis-related organ dysfunction, acute hypoxic respiratory failure as evidenced by SPO2 < 89% on RM air, acute kidney injury as evidenced by elevated creatinine 5.5 from baseline creatinine 1.1, and septic shock requiring vasopressors to maintain MAP > 65.  Present on Admission (POA).    Acute Hypoxic Respiratory Failure as evidenced by, ABG PO2 on room air < 60, SpO2 < 89% on room air, and necessitating intubation  with clinical indicators, shortness of breath and use of accessory muscles.  Present on Admission (POA)  Mechanical Ventilation, resolved: Extubated 09/16/2024  AKI on CKD, now on RRT  Acute Rhabdomyolysis  CK level improving, still >22,000  Hyperglycemia  Leukocytosis  Anemia, normocytic  Thrombocytopenia, improving  Chronic Conditions: HTN, HTN, DMII     Plan:  Hemodynamically stable  Minimal O2 NC requirements.  Incentive spirometer brought to bedside and educated on use. Recommended 10x/hour or during commercial breaks on television.  Receiving 2U PRBC for Hgb 6.5 this  AM.  Recommend limiting maintenance IVF if possible  Currently on Zosyn  Timing of HD/UF per nephrology, s/p 2L UF yesterday.  Continue pain management  Will continue to follow after surgery  Discussed with bedside RN    This patient is high risk due to their chronic illness with severe exacerbation, progression, or side effects of treatment and their acute illness that poses a threat to life or bodily function.    -----    Chief Complaint/Reason for Admission:  Shock in setting of compartment syndrome s/p fasciotomy secondary to LLE venous thrombosis s/p thrombectomy     Pertinent interval/overnight events:  No significant pain.  Feels fatigued/weak this AM.  No respiratory concerns.  Anticipating surgery later today.    Physical Examination:  BP 125/62   Pulse 100   Temp 98 F (36.7 C)   Resp 15   Ht 1.753 m (5' 9)   Wt 71.2 kg (156 lb 15.5 oz)   SpO2 93%   BMI 23.18 kg/m      RESPIRATORY -  No respiratory distress, Clear to auscultation   CARDIAC -  Regular rate and rhythm,No gallop  ABDOMEN -  Soft, non-tender.  EXTREMITIES -LLE ace bandaged, RUE swelling  NEUROLOGICAL -  alert, moves all extremities    Other pertinent exam findings:  Body mass index is 23.18 kg/m.    Laboratory Results reviewed BMP, CBC, CPK levels downtrending  Radiography [personally reviewed, along with the Radiologist's reports (if available)] notable for:    LLE Arterial US , 09/15/2024  1. Heterogeneous intramuscular fluid collections within the left groin, could reflect developing ischemic myonecrosis changes. Contrast MRI would  best be further evaluate.  2. Patent left external iliac and common femoral arteries, both with elevated artery velocities suggesting moderate luminal stenosis. The distal left SFA is patent.      TTE, 09/12/2024    Image quality is poor, patient screaming in pain during exam.     Left Ventricle: Normal left ventricular systolic function with a   visually estimated EF of 55 - 60%. Left ventricle is  smaller than normal.   Normal wall thickness.  LVIDd is 3.1 cm. Normal wall motion.     Left Atrium: Left atrium is smaller than normal.     Right Ventricle: Normal systolic function.     Aortic Valve: Individual aortic valve leaflets not well visualized.     Pericardium: No pericardial effusion.     Electronically signed by: Jerrell LELON Purpura, DO, 09/21/2024 7:53 AM     "

## 2024-09-21 NOTE — Progress Notes (Signed)
 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  BKA revision 11/12.    Plan  -Plan for IHD tomorrow.  On TTS schedule while inpatient.  Will need to plan for outpatient dialysis.  - Renal diet  - Fluid restriction < 1 L   - Renal multivitamin to replace water -soluble vitamins lost during dialysis   - Avoid Lovenox , Demerol, Morphine , K-containing IVF, Mg- or Phos- containing enemas   - Dose meds eGFR<10   - Strict I&Os   - Daily weight       Subjective  Seen and examined in 406.  Going for BKA revision today       Objective  Physical Exam:  Temp: 97.9 F (36.6 C) (09/21/24 1055) Temp  Avg: 97.9 F (36.6 C)  Min: 97.5 F (36.4 C)  Max: 98.2 F (36.8 C) BP: 139/75 (09/21/24 1055) Pulse: 95 (09/21/24 1055) Resp: 15 (09/21/24 1055) SpO2: 92 % (09/21/24 1055)     General: Alert and oriented, no acute distress   CV: Regular rate and rhythm.   Lungs: Regular respiratory effort.   Abd: Soft and non-tender.   Extremities: No edema.  Left BKA clean and dressed.  Access: Right FEM HD cath    I/O last 3 completed shifts:  In: 1660.1 [P.O.:370; I.V.:1290.1]  Out: 2040 [Urine:40; Dialysis UF:2000]  Admission: Weight: 50.9 kg (112 lb 3.4 oz) (09/13/24 1900)  Most recent: Weight: 71.2 kg (156 lb 15.5 oz) (09/21/24 0500)       Labs:   Recent Labs     09/21/24  0508 09/20/24  0949 09/20/24  0745 09/20/24  0545 09/19/24  0915 09/19/24  0307   WBC 9.4 12.7*   < >  --   --  9.9   HEMOGLOBIN 6.5* 8.3*   < >  --    < > 7.9*   HEMATOCRIT 20.3* 24.9*   < >  --    < > 24.3*   PLATELETS 237 225   < >  --   --  144   NA 142  --   --  138  --  140   POTASSIUM 4.3  --   --  5.3  --  4.5   CL  103  --   --  102  --  103   CO2 26  --   --  19  --  26   BUN 26  --   --  42*  --  36*   CREATININE 4.2*  --   --  5.6*  --  4.8*   CA 7.4*  --   --  7.8*  --  7.8*   PHOS 4.3  --   --   --   --  4.7*   ALB 2.2*  --   --   --   --   --    MG 1.9  --   --  1.9  --  2.0    < > = values in this interval not displayed.       Imaging Studies: Reviewed in  chart    Medications:  Scheduled meds:  insulin  glargine, 0.2 Units/kg/dose, Daily at 2100  insulin  lispro, 1-5 Units, Q4H  cefazolin (ANCEF) infusion, 2 g, To [facility]-Once  polyethylene glycol, 1 Packet, Daily      PRN meds:  heparin , 25 Units/kg/dose, PRN   Or  heparin , 50 Units/kg/dose, PRN  dextrose  50 % in water  (D50W), 12.5 g, Once PRN  gabapentin , 100 mg, Daily PRN  ondansetron , 4 mg, Q6H PRN   Or  ondansetron , 4 mg, Q6H PRN  flumazeniL, 0.2 mg, Q1Min PRN  naloxone, 0.4 mg, PRN  naloxone, 0.1 mg, Q2Min PRN  nalbuphine, 2 mg, Q4H PRN  saline flush, 10 mL, PRN  NaCl 0.9%, 1,000 mL, Continuous PRN  albuterol , 2.5 mg, PRN  heparin , , PRN  heparin , 5,000 Units, To Dialysis-PRN  oxyCODONE, 5 mg, Q4H PRN   Or  oxyCODONE, 10 mg, Q4H PRN  dextrose , 15 g Carb, PRN  glucagon , 1 mg, PRN  bisacodyL, 5 mg, Daily PRN   Or  bisacodyL, 10 mg, Daily PRN      Continuous infusions:  lactated ringers   heparin , Last Rate: 10 Units/kg/hr (09/21/24 0800)  NaCl 0.9%, Last Rate: 10 mL/hr at 09/20/24 2200  HYDROmorphone         Hospital Problems  Principal Problem:    Arterial embolism and thrombosis of lower extremity (HC CODE)  Active Problems:    Acute renal failure due to rhabdomyolysis    Vascular insufficiency    Compartment syndrome of left lower extremity      Mabel Roosevelt, DO, H[facility]-C  Staff Nephrologist  [facility]    "

## 2024-09-22 NOTE — Progress Notes (Signed)
 "[facility]        Hospitalist Progress Note    Patient Identifier/Hospitalist     Patient Name: Joe Avery DOB: [birth year]      I saw and examined the patient on 09/22/2024 in [id number]     Hospitalist: Carvel SHAUNNA Dean, MD    Disposition/Assessment and Plan     Disposition: Patient was admitted to the hospital from Home and plan to discharge to [facility] when medically ready       Reason for continued hospitalization: Vascular surgery recs, nephrology recs    Assessment and Plan :  Joe Avery is a 63 year old male, with a history of hypertension, hyperlipidemia, type 2 diabetes who presented to the ED with occlusion of left femoral artery.   Admission Date: 09/13/2024   Hospital day# 9    Critical limb ischemia  -From occluded left femoral artery, status post left iliac thrombectomy with posterior tibial and dorsalis pedis thrombectomy with patch angioplasty, vascular surgery following, appreciate recommendations. 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  Compartment syndrome of left lower extremity  -status post 4 compartment fasciotomy, continue monitoring wounds, vascular surgery following. Pain regimen in place, adjust as needed.     4.  Acute blood loss anemia  -Likely secondary to multiple procedures possibly complicated by low erythropoietin production due to acute kidney failure  - Hemoglobin stable at 8.5 today, will continue to monitor     5.  Septic shock  -Secondary to lower extremity ischemia with necrotic limb and sepsis related organ dysfunction, currently off pressors and extubated.   -Continue to monitor for signs of infection and adjust antibiotics as needed,  -Blood cultures have been no growth for 5 days and patient has been afebrile, Zosyn discontinued on 11/12     6.  Hyperlipidemia  - Hold statin due to elevated CK     7.  Type 2 diabetes  - It appears 14 units of glargine were administered on 11/12 at 8 PM although no glargine dosing is ordered currently, unclear reason why or if one-time dose was ordered in the night but patient became hypoglycemic to 48 although reportedly did not have any symptoms  - Currently on D10 at 50 mL an hour, glucose still mildly low at 102 will continue to monitor         patient has malnutrition diagnosis.   Severe Malnutrition in context of Acute Illness/Injury (possibly on chronic malnutrition), Present on Admission (POA)       * Acute Blood Loss  Anemia Present on Admission (POA)  * Anemia of Chronic Kidney Disease Present on Admission (POA)  * Acute Kidney Injury Present on Admission (POA)               DVT Prophylaxis: Heparin     Code Status: Orders Placed This Encounter      Total Support     Diagnostic Data:  Labs, reviewed:  Recent Results (from the past 24 hours)   POC GLUCOSE    Collection Time: 09/21/24  2:55 PM   Result Value Ref Range    POC Glucose 147 (H) 70 - 99 mg/dL    Scan Result     HEMOGLOBIN AND HEMATOCRIT    Collection Time: 09/21/24  3:54 PM   Result Value Ref Range     Hemoglobin 9.5 (L) 13.0 - 17.7 g/dL    Hematocrit 72.0 (L) 37.5 - 51.0 %    Scan Result     POC GLUCOSE    Collection Time: 09/21/24  5:28 PM   Result Value Ref Range    POC Glucose 164 (H) 70 - 99 mg/dL    Scan Result     POC GLUCOSE    Collection Time: 09/21/24  8:22 PM   Result Value Ref Range    POC Glucose 163 (H) 70 - 99 mg/dL    Scan Result     POC GLUCOSE    Collection Time: 09/22/24 12:15 AM   Result Value Ref Range    POC Glucose 64 (L) 70 - 99 mg/dL    Scan Result     POC GLUCOSE    Collection Time: 09/22/24 12:16 AM   Result Value Ref Range    POC Glucose 62 (L) 70 - 99 mg/dL    Scan Result     POC GLUCOSE    Collection Time: 09/22/24  1:00 AM   Result Value Ref Range    POC Glucose 73 70 - 99 mg/dL    Scan Result     POC GLUCOSE    Collection Time: 09/22/24  1:13 AM   Result Value Ref Range    POC Glucose 67 (L) 70 - 99 mg/dL    Scan Result     POC GLUCOSE    Collection Time: 09/22/24  1:37 AM   Result Value Ref Range    POC Glucose 102 (H) 70 - 99 mg/dL    Scan Result     POC GLUCOSE    Collection Time: 09/22/24  1:54 AM   Result Value Ref Range    POC Glucose 81 70 - 99 mg/dL    Scan Result     POC GLUCOSE    Collection Time: 09/22/24  2:31 AM   Result Value Ref Range    POC Glucose 62 (L) 70 - 99 mg/dL    Scan Result     POC GLUCOSE    Collection Time: 09/22/24  3:37 AM   Result Value Ref Range    POC Glucose 48 (LL) 70 - 99 mg/dL    Scan Result     POC GLUCOSE    Collection Time: 09/22/24  4:03 AM   Result Value Ref Range    POC Glucose 92 70 - 99 mg/dL    Scan Result     POC GLUCOSE    Collection Time: 09/22/24  4:45 AM   Result Value Ref Range    POC Glucose 72 70 - 99 mg/dL    Scan Result  MAGNESIUM , SERUM    Collection Time: 09/22/24  5:30 AM   Result Value Ref Range    Magnesium  1.9 1.4 - 2.5 mg/dL   CK (CPK), TOTAL    Collection Time: 09/22/24  5:30 AM   Result Value Ref Range    CK 3,299 (H) 0 - 250 U/L   COMPLETE BLOOD COUNT    Collection Time: 09/22/24  5:30 AM   Result Value Ref Range     WBC Count 9.3 3.5 - 10.9 K/uL    RBC 2.94 (L) 4.14 - 5.80 M/uL    Hemoglobin 8.5 (L) 13.0 - 17.7 g/dL    Hematocrit 74.0 (L) 37.5 - 51.0 %    MCV 88.1 80.0 - 100.0 fL    MCH 28.9 26.0 - 34.0 pg    MCHC 32.8 30.7 - 35.5 g/dL    RDW 84.1 (H) <=84.9 %    Platelet Count 233 140 - 400 K/uL    MPV 10.6 7.2 - 11.7 fL    nRBC 0 <=0 /100 WBCs    Scan Result     ACTIVATED PARTIAL THROMBOPLASTIN TIME    Collection Time: 09/22/24  5:30 AM   Result Value Ref Range    APTT 40.0 (H) 24.5 - 35.2 Sec   BASIC METABOLIC PANEL    Collection Time: 09/22/24  5:30 AM   Result Value Ref Range    Sodium 139 135 - 148 mEq/L    Potassium 3.5 3.4 - 5.3 mEq/L    Chloride 102 96 - 110 mEq/L    Carbon Dioxide 25 19 - 32 mEq/L    BUN 32 (H) 3 - 29 mg/dL    Creatinine 5.5 (H) 0.5 - 1.4 mg/dL    Glucose 58 (L) 70 - 99 mg/dL    Calcium  7.5 (L) 8.5 - 10.5 mg/dL    Anion Gap 12 5 - 15    BUN/CREAT Ratio 6 (L) 7 - 25    Estimated GFR 11 (L) >=60 mL/min/1.56m*2   POC GLUCOSE    Collection Time: 09/22/24  5:34 AM   Result Value Ref Range    POC Glucose 59 (L) 70 - 99 mg/dL    Scan Result     POC GLUCOSE    Collection Time: 09/22/24  6:05 AM   Result Value Ref Range    POC Glucose 91 70 - 99 mg/dL    Scan Result     POC GLUCOSE    Collection Time: 09/22/24  7:08 AM   Result Value Ref Range    POC Glucose 90 70 - 99 mg/dL    Scan Result     POC GLUCOSE    Collection Time: 09/22/24  8:08 AM   Result Value Ref Range    POC Glucose 80 70 - 99 mg/dL    Scan Result     POC GLUCOSE    Collection Time: 09/22/24 12:41 PM   Result Value Ref Range    POC Glucose 102 (H) 70 - 99 mg/dL    Scan Result       Patient does not have new imaging results since 0700 yesterday.   Subjective   The patient was examined at bedside this morning.  He did not appear to be in acute distress.  Denied any significant symptoms at this time.    Objective Data   Vital Signs:  BP 136/75   Pulse 93   Temp 97.8 F (36.6 C)   Resp (!) 37   Ht 1.753  m (5' 9)   Wt 71.2 kg (156 lb 15.5  oz)   SpO2 91%   BMI 23.18 kg/m    I's and O's:    Intake/Output Summary (Last 24 hours) at 09/22/2024 1353  Last data filed at 09/22/2024 1300  Gross per 24 hour   Intake 1642.04 ml   Output 2050 ml   Net -407.96 ml      Baseline Weight: 50.9 kg (112 lb 3.4 oz) (09/13/24 1900)   Most recent Weight: 71.2 kg (156 lb 15.5 oz) (09/22/24 0845)   Last Bowel Movement: 09/21/24 (09/22/24 0400)       Physical Examination:  Physical Exam  Constitutional:       General: He is not in acute distress.  HENT:      Head: Normocephalic.   Cardiovascular:      Rate and Rhythm: Normal rate and regular rhythm.   Pulmonary:      Effort: No respiratory distress.      Breath sounds: Normal breath sounds.   Abdominal:      General: There is no distension.      Palpations: Abdomen is soft.      Tenderness: There is no abdominal tenderness.   Musculoskeletal:      Comments: Right upper extremity swelling appears to be slightly improving, left lower extremity AKA with wound VAC   Neurological:      General: No focal deficit present.      Mental Status: He is alert and oriented to person, place, and time.          Medications        Current Medications[1]         Signature     Electronically signed by: Carvel SHAUNNA Dean, MD, 09/22/2024 1:53 PM            [1]  Current Facility-Administered Medications   Medication Dose Route Frequency Provider Last Rate Last Admin    D10W   Intravenous Continuous Lendell Fleeta Harry, APRN 50 mL/hr at 09/22/24 0900 Rate Verify at 09/22/24 0900    insulin  lispro (HumaLOG ) injection 1-5 Units  1-5 Units Subcutaneous Q4H Pradarelli, Jason C, MD   1 Units at 09/21/24 2023    NaCl 0.9 % 300 mL  300 mL Intravenous To Dialysis-PRN Salupo, Mabel Gully, DO        acetaminophen  (TYLENOL  EXTRA STR) tablet 1,000 mg  1,000 mg Oral Q6H Pradarelli, Jason C, MD   1,000 mg at 09/22/24 1250    heparin  25,000 units in 0.45% NaCl 250 ml infusion  500 Units/hr Intravenous Continuous Hoffman, Molly L, APRN 5 mL/hr at 09/22/24  0900 500 Units/hr at 09/22/24 0900    dextrose  50 % in water  (D50W) intravenous syringe 12.5 g  12.5 g IV Push Once PRN Pradarelli, Jason C, MD   12.5 g at 09/22/24 0537    gabapentin  (NEURONTIN ) capsule 100 mg  100 mg Oral Daily PRN Pradarelli, Jason C, MD   100 mg at 09/21/24 2205    ondansetron  (ZOFRAN ) injection 4 mg  4 mg IV Push Q6H PRN Pradarelli, Jason C, MD        Or    ondansetron  (ZOFRAN  ODT) RAPID DISSOLVING tablet 4 mg  4 mg Oral Q6H PRN Pradarelli, Jason C, MD        flumazeniL (ROMAZICON) injection 0.2 mg  0.2 mg IV Push Q1Min PRN Pradarelli, Jason C, MD        naloxone [facility]) injection solution 0.4 mg  0.4 mg IV Push PRN Pradarelli, Jason C, MD        naloxone (NARCAN) DILUTION injection 0.1 mg  0.1 mg IV Push Q2Min PRN Pradarelli, Jason C, MD        nalbuphine (NUBAIN) injection solution 2 mg  2 mg IV Push Q4H PRN Pradarelli, Jason C, MD        saline flush  10 mL IV Push PRN Pradarelli, Jason C, MD        NaCl 0.9% 1,000 mL  1,000 mL Intravenous Continuous PRN Pradarelli, Jason C, MD 10 mL/hr at 09/22/24 0900 Rate Verify at 09/22/24 0900    HYDROmorphone  (DILAUDID ) PCA 6 mg/30 mL (Low Dose)   Intravenous Continuous Pradarelli, Jason C, MD   Rate Verify at 09/22/24 0000    albuterol  (PROVENTIL ) 2.5 mg /3 mL (0.083 %) inhalation solution 2.5 mg  2.5 mg Inhalation PRN Pradarelli, Jason C, MD        heparin  injection 5,000 Units  5,000 Units Instill To Dialysis-PRN Pradarelli, Jason C, MD   15,500 Units at 09/22/24 1051    oxyCODONE (ROXICODONE) IR tablet 5 mg  5 mg Oral Q4H PRN Pradarelli, Jason C, MD   5 mg at 09/21/24 2205    Or    oxyCODONE (ROXICODONE) IR tablet 10 mg  10 mg Oral Q4H PRN Pradarelli, Jason C, MD   10 mg at 09/20/24 2344    dextrose  (GLUTOSE) gel 15 g Carb  15 g Carb Oral PRN Pradarelli, Jason C, MD   15 g Carb at 09/19/24 1605    glucagon  injection 1 mg  1 mg Subcutaneous PRN Pradarelli, Jason C, MD   1 mg at 09/18/24 2058    polyethylene glycol (MIRALAX ) packet  17 g  1 Packet Oral Daily Pradarelli, Jason C, MD   17 g at 09/16/24 0908    bisacodyL (DULCOLAX) enteric-coated tablet 5 mg  5 mg Oral Daily PRN Pradarelli, Jason C, MD        Or    bisacodyL (DULCOLAX) suppository 10 mg  10 mg Rectal Daily PRN Pradarelli, Jason C, MD       "

## 2024-09-29 NOTE — Progress Notes (Signed)
 "[facility]        Hospitalist Progress Note    Patient Identifier/Hospitalist     Patient Name: Joe Avery DOB: Feb 16, 1961      Hospitalist: Mackey MARLA Furbish, MD    Disposition/Assessment and Plan     Disposition: ECF in 1-2 days.       Reason for continued hospitalization: Poor oral intake.  ECF, outpt HD arrangements.    Keiyon Plack is a 63 year old male, with a history of hypertension, COPD, diabetes mellitus type 2, CKD, presented to [facility] for evaluation of severe left leg pain, cold to touch.  CTA showed occluded left common femoral artery with heterogeneous musculature concerning for myonecrosis he was evaluated by vascular surgery team 11/4, status post catheter directed thrombolysis and started on dual antiplatelets postprocedure found to have AKI, hyperkalemia, and metabolic acidosis, CK greater than 22K was also on DKA protocol transition to subcu insulin  prior to transfer.  Patient unfortunately developed compartment syndrome due to protracted episode of acute limb ischemia and was in septic shock and transferred to [facility] main campus for further evaluation.    Admission Date: 09/13/2024   Hospital day# 16    Critical limb ischemia.  Status post cath directed thrombolysis at outside hospital however given protracted ischemia developed compartment syndrome.  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  Vascular surgery team recommending full anticoagulation,  aspirin . was on heparin  drip transitioned to Eliquis 11/19  AKI on CKD stage III.AKI likely ATN in the setting of rhabdomyolysis, shock.  Initiated on hemodialysis tunneled dialysis catheter placed on 11/18.  Outpatient HD arrangements being arranged by social services  pending at this time.  Appreciate nephrology input.   Nontraumatic rhabdomyolysis in the setting of critical limb ischemia.  Metabolic acidosis in the setting of AKI.  Diabetes mellitus type 2, uncontrolled based on last A1c of 10 from 09/14/2024.  Blood sugars well-controlled currently.  Continue correction insulin .  Septic shock, POA secondary to left lower extremity ischemia with necrotizing infection.  Off antibiotics.  Off pressors.  Acute hypoxic respiratory failure POA requiring intubation off pressors.  Extubated on 11/7.  On 2 L supplemental oxygen via nasal cannula.  Wean oxygen as tolerated.  Acute blood loss anemia secondary to recent procedures.  Hemoglobin stable.  Severe Malnutrition in context of Acute Illness/Injury (possibly on chronic malnutrition per 11/13), as evidenced by: No data recorded, per Aspen criteria.  Treatment plan:  oral supplement; per RDN, LD (Dietitian) nutritional assessment.  Clinical impact:  delayed wound healing, increased post discharge rehab needs.  Oral intake remains poor encouraged to at least drink nutritional supplements.  DVT Prophylaxis: Apixaban    Code Status: Orders Placed This Encounter      Total Support     Diagnostic Data:  Labs--Reviewed: I personally reviewed the following labs:  my personal interpretation is:   Recent Results (from the past 24 hours)   HEPARIN  APTT    Collection Time: 09/28/24  3:49 PM   Result Value Ref Range    Heparin  aPTT 127.0 (HH) 74.0 - 110.0 Sec   POC GLUCOSE    Collection Time: 09/28/24  5:14 PM   Result Value Ref Range    POC Glucose 86 70 - 99 mg/dL    Scan Result  POC GLUCOSE    Collection Time: 09/28/24 10:04 PM   Result Value Ref Range    POC Glucose 83 70 - 99 mg/dL    Scan Result     POC GLUCOSE    Collection Time: 09/29/24 12:13 AM   Result Value Ref Range    POC Glucose 84 70 - 99 mg/dL    Scan Result     POC GLUCOSE    Collection Time: 09/29/24  4:33 AM   Result Value Ref Range    POC Glucose 88 70 - 99 mg/dL    Scan  Result     MAGNESIUM , SERUM    Collection Time: 09/29/24  6:19 AM   Result Value Ref Range    Magnesium  1.9 1.4 - 2.5 mg/dL   BASIC METABOLIC PANEL    Collection Time: 09/29/24  6:19 AM   Result Value Ref Range    Sodium 140 135 - 148 mEq/L    Potassium 4.0 3.4 - 5.3 mEq/L    Chloride 98 96 - 110 mEq/L    Carbon Dioxide 26 19 - 32 mEq/L    BUN 17 3 - 29 mg/dL    Creatinine 4.1 (H) 0.5 - 1.4 mg/dL    Glucose 87 70 - 99 mg/dL    Calcium  8.0 (L) 8.5 - 10.5 mg/dL    Anion Gap 16 (H) 5 - 15    BUN/CREAT Ratio 4 (L) 7 - 25    Estimated GFR 16 (L) >=60 mL/min/1.12m*2   POC GLUCOSE    Collection Time: 09/29/24  7:37 AM   Result Value Ref Range    POC Glucose 86 70 - 99 mg/dL    Scan Result     POC GLUCOSE    Collection Time: 09/29/24 12:00 PM   Result Value Ref Range    POC Glucose 80 70 - 99 mg/dL    Scan Result     POC GLUCOSE    Collection Time: 09/29/24  3:10 PM   Result Value Ref Range    POC Glucose 84 70 - 99 mg/dL    Scan Result       Imaging results since 0700 yesterday reviewed. My Interpretation:    Subjective   The patient was seen and examined at the bedside.  Oral intake remains poor.  Complains of phantom pain.  Denies any chest pain, shortness of breath.    Objective Data   Vital Signs:  BP 161/83   Pulse 90   Temp 98.7 F (37.1 C)   Resp 22   Ht 1.753 m (5' 9)   Wt 61.8 kg (136 lb 4.8 oz)   SpO2 94%   BMI 20.13 kg/m    I's and O's:    Intake/Output Summary (Last 24 hours) at 09/29/2024 1547  Last data filed at 09/29/2024 0440  Gross per 24 hour   Intake 203 ml   Output 1865 ml   Net -1662 ml      Baseline Weight: 50.9 kg (112 lb 3.4 oz) (09/13/24 1900)   Most recent Weight: 61.8 kg (136 lb 4.8 oz) (09/29/24 9392)   Last Bowel Movement: 09/27/24 (per pt) (09/29/24 0915)   Physical Exam  Middle-age male lying on the bed in no apparent distress  Neck: Supple  Chest: Tunneled dialysis catheter noted over the left upper chest.  Site appears clean.  Abdomen: Soft, nontender  Extremities: Status post  left AKA dressing intact.  Trace edema over right lower extremity.    Medications  Current Medications[1]         Signature     Electronically signed by: Vinay K Lingabathula, MD, 09/29/2024 3:47 PM              [1]  Current Facility-Administered Medications   Medication Dose Route Frequency Provider Last Rate Last Admin    apixaban (ELIQUIS) tablet 10 mg  10 mg Oral BID Lingabathula, Vinay K, MD   10 mg at 09/29/24 9093    Followed by    NOREEN ON 10/05/2024] apixaban (ELIQUIS) tablet 5 mg  5 mg Oral BID Lingabathula, Vinay K, MD        heparin  injection 5,000 Units  5,000 Units Instill To Dialysis-PRN Eduafo, Augustus K, MD   5,000 Units at 09/28/24 2147    lactated ringers  parenteral solution   Intravenous Continuous Deborrah Clotilda Ruth, MD        amitriptyline (ELAVIL) tablet 10 mg  10 mg Oral BID Yashpal, Avneil S, MD   10 mg at 09/29/24 9093    aspirin  (ASA EC) enteric coated tablet 81 mg  81 mg Oral Daily Choudary, Suneeth P, MD   81 mg at 09/29/24 9093    heparin  (porcine) 5,000 unit/ml injection (aPTT monitoring)  25 Units/kg/dose IV Push PRN Rosan Geofm CROME, APRN   1,800 Units at 09/28/24 1033    Or    heparin  (porcine) 5,000 unit/ml injection (aPTT monitoring)  50 Units/kg/dose IV Push PRN Rosan Geofm L, APRN        insulin  lispro (HumaLOG ) injection 1-5 Units  1-5 Units Subcutaneous Q4H Pradarelli, Jason C, MD   1 Units at 09/23/24 1206    NaCl 0.9 % 300 mL  300 mL Intravenous To Dialysis-PRN Salupo, Mabel Gully, DO        acetaminophen  (TYLENOL  EXTRA STR) tablet 1,000 mg  1,000 mg Oral Q6H Pradarelli, Jason C, MD   1,000 mg at 09/28/24 1224    dextrose  50 % in water  (D50W) intravenous syringe 12.5 g  12.5 g IV Push Once PRN Pradarelli, Jason C, MD   12.5 g at 09/22/24 0537    gabapentin  (NEURONTIN ) capsule 100 mg  100 mg Oral Daily PRN Pradarelli, Jason C, MD   100 mg at 09/28/24 0007    ondansetron  (ZOFRAN ) injection 4 mg  4 mg IV Push Q6H PRN Pradarelli, Jason C, MD         Or    ondansetron  (ZOFRAN  ODT) RAPID DISSOLVING tablet 4 mg  4 mg Oral Q6H PRN Pradarelli, Jason C, MD        albuterol  (PROVENTIL ) 2.5 mg /3 mL (0.083 %) inhalation solution 2.5 mg  2.5 mg Inhalation PRN Pradarelli, Jason C, MD        oxyCODONE (ROXICODONE) IR tablet 5 mg  5 mg Oral Q4H PRN Pradarelli, Jason C, MD   5 mg at 09/29/24 1517    Or    oxyCODONE (ROXICODONE) IR tablet 10 mg  10 mg Oral Q4H PRN Pradarelli, Jason C, MD   10 mg at 09/24/24 9662    dextrose  (GLUTOSE) gel 15 g Carb  15 g Carb Oral PRN Pradarelli, Jason C, MD   15 g Carb at 09/19/24 1605    glucagon  injection 1 mg  1 mg Subcutaneous PRN Pradarelli, Jason C, MD   1 mg at 09/18/24 2058    polyethylene glycol (MIRALAX ) packet 17 g  1 Packet Oral Daily Pradarelli, Jason C, MD   17 g at 09/28/24 [phone]  bisacodyL (DULCOLAX) enteric-coated tablet 5 mg  5 mg Oral Daily PRN Pradarelli, Jason C, MD        Or    bisacodyL (DULCOLAX) suppository 10 mg  10 mg Rectal Daily PRN Pradarelli, Jason C, MD       "

## 2024-10-02 NOTE — Progress Notes (Signed)
"      [facility]                                       Hemodialysis Procedure Note    I was present during dialysis session.  Did fine on dialysis without any complication  Vitals:    10/02/24 1313 10/02/24 1315 10/02/24 1317 10/02/24 1609   BP: 152/90 (!) 159/92 158/90 (!) 159/98   Pulse: 79 77 78 95   Resp: 18 17 13 16    Temp:   97.7 F (36.5 C)    SpO2: 99% 99% 99% 97%         Dialysis Settings  Dialyzer Type: F180NRe (10/02/24 1000)   QB (Blood Flow): 300 MILLILITERS/MINUTE (10/02/24 1313)   QD (Dialysate Flow): 700 MILLILITERS/MINUTE (10/02/24 1313)   Ultrafiltration Program: 0 (10/02/24 1015)       Bath Bicarb: 35 (10/02/24 1000)   Bath Potassium (K+): 3 (10/02/24 1000)   Bath Sodium: 140 (10/02/24 1000)   Bath Calcium  (Ca+): 2.5 (10/02/24 1000)     HD Rx reviewed  BFR reviewed  Labs reviewed  Anticoagulation: none.  Ultrafiltration goal as tolerated. No access issues.   Discussed with dialysis nurse    Electronically signed by: Eulas MARLA Cutting, MDMD,MS,FACP,FASN, 10/02/2024 7:15 PM    Call [phone] matchMD [phone] 5 PM-8AM & Weekends) for [facility]     "

## 2024-10-03 NOTE — Progress Notes (Signed)
 "[facility]        Hospitalist Progress Note    Patient Identifier/Hospitalist     Patient Name: Joe Avery DOB: Oct 25, 1961      Hospitalist: Mackey MARLA Furbish, MD    Disposition/Assessment and Plan     Disposition: ECF in 2-3 days.       Reason for continued hospitalization: Poor oral intake, outpatient ECF, HD arrangements.    Assessment and plan:    Joe Avery is a 63 year old male, with a history of hypertension, COPD, diabetes mellitus type 2, CKD, presented to [facility] for evaluation of severe left leg pain, cold to touch.  CTA showed occluded left common femoral artery with heterogeneous musculature concerning for myonecrosis he was evaluated by vascular surgery team 11/4, status post catheter directed thrombolysis and started on dual antiplatelets postprocedure found to have AKI, hyperkalemia, and metabolic acidosis, CK greater than 22K was also on DKA protocol transition to subcu insulin  prior to transfer.  Patient unfortunately developed compartment syndrome due to protracted episode of acute limb ischemia and was in septic shock and transferred to [facility] main campus for further evaluation.     Admission Date: 09/13/2024   Hospital day# 20    Critical limb ischemia.  Status post catheter directed thrombolysis at outside hospital however given protracted ischemia developed compartment syndrome.  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  Vascular surgery team recommending full anticoagulation,  aspirin . was on heparin  drip transitioned to Eliquis 11/19.  Hemoglobin stable on anticoagulation.  AKI on CKD stage III.AKI likely ATN in the setting of rhabdomyolysis, shock.   Initiated on hemodialysis tunneled dialysis catheter placed  on 11/18.  Remains oliguric/anuric, dialysis dependent at this time. Outpatient HD arrangements being arranged by social services  Appreciate nephrology input.   Nontraumatic rhabdomyolysis in the setting of critical limb ischemia.  Metabolic acidosis in the setting of AKI.  Diabetes mellitus type 2, uncontrolled based on last A1c of 10 from 09/14/2024.  However his oral intake has been poor and blood sugars well-controlled during this hospitalization.  Continue correction insulin .  Septic shock, POA secondary to left lower extremity ischemia with necrotizing infection.  Off antibiotics.  Off pressors.  Acute hypoxic respiratory failure POA requiring intubation off pressors.  Extubated on 11/7.  On 4 L supplemental oxygen via nasal cannula.  Wean oxygen as tolerated.  Acute blood loss anemia secondary to recent procedures.  Hemoglobin stable last few days 10.1 today.  Severe Malnutrition in context of Acute Illness/Injury (possibly on chronic malnutrition per 11/13), as evidenced by: No data recorded, per Aspen criteria.  Treatment plan:  oral supplement; per RDN, LD (Dietitian) nutritional assessment.  Clinical impact:  delayed wound healing, increased post discharge rehab needs.  started on Remeron 11/22.Oral intake seems to be improving slowly   Okay to liberalize to regular diet.  Discussed with nutritionist.  Encouraged to drink nutritional supplements.  DVT Prophylaxis: Apixaban    Code Status: Orders Placed This Encounter      Total Support     Diagnostic Data:  Labs--Reviewed: I personally reviewed the following labs:  my personal interpretation is:   Recent Results (from the past 24 hours)   POC GLUCOSE    Collection Time: 10/02/24  5:26 PM   Result Value Ref Range  POC Glucose 109 (H) 70 - 99 mg/dL    Scan Result     POC GLUCOSE    Collection Time: 10/02/24  9:42 PM   Result Value Ref Range    POC Glucose 96 70 - 99 mg/dL    Scan Result     COMPLETE BLOOD COUNT    Collection Time: 10/03/24  5:04 AM    Result Value Ref Range    WBC Count 8.1 3.5 - 10.9 K/uL    RBC 3.39 (L) 4.14 - 5.80 M/uL    Hemoglobin 10.0 (L) 13.0 - 17.7 g/dL    Hematocrit 67.5 (L) 37.5 - 51.0 %    MCV 95.6 80.0 - 100.0 fL    MCH 29.5 26.0 - 34.0 pg    MCHC 30.9 30.7 - 35.5 g/dL    RDW 82.3 (H) <=84.9 %    Platelet Count 483 (H) 140 - 400 K/uL    MPV 10.2 7.2 - 11.7 fL    nRBC 0 <=0 /100 WBCs    Scan Result     MAGNESIUM , SERUM    Collection Time: 10/03/24  5:05 AM   Result Value Ref Range    Magnesium  1.9 1.4 - 2.5 mg/dL   BASIC METABOLIC PANEL    Collection Time: 10/03/24  5:05 AM   Result Value Ref Range    Sodium 136 135 - 148 mEq/L    Potassium 4.6 3.4 - 5.3 mEq/L    Chloride 100 96 - 110 mEq/L    Carbon Dioxide 22 19 - 32 mEq/L    BUN 13 3 - 29 mg/dL    Creatinine 3.8 (H) 0.5 - 1.4 mg/dL    Glucose 887 (H) 70 - 99 mg/dL    Calcium  8.1 (L) 8.5 - 10.5 mg/dL    Anion Gap 14 5 - 15    BUN/CREAT Ratio 3 (L) 7 - 25    Estimated GFR 17 (L) >=60 mL/min/1.41m*2   PHOSPHORUS    Collection Time: 10/03/24  5:05 AM   Result Value Ref Range    Phosphorus 3.4 2.5 - 4.5 mg/dL   POC GLUCOSE    Collection Time: 10/03/24  7:59 AM   Result Value Ref Range    POC Glucose 149 (H) 70 - 99 mg/dL    Scan Result     POC GLUCOSE    Collection Time: 10/03/24 12:43 PM   Result Value Ref Range    POC Glucose 100 (H) 70 - 99 mg/dL    Scan Result       Imaging results since 0700 yesterday reviewed. My Interpretation:    Subjective   The patient was seen and examined at the bedside.  Pain well-controlled.  Denies any chest pain, shortness of breath.    Oral intake seems to be improving slowly.    Objective Data   Vital Signs:  BP (!) 158/98   Pulse 92   Temp 98.6 F (37 C)   Resp 15   Ht 1.753 m (5' 9)   Wt 64.6 kg (142 lb 8 oz)   SpO2 97%   BMI 21.04 kg/m    I's and O's:    Intake/Output Summary (Last 24 hours) at 10/03/2024 1512  Last data filed at 10/03/2024 0801  Gross per 24 hour   Intake --   Output 0 ml   Net 0 ml      Baseline Weight: 50.9 kg (112 lb  3.4 oz) (09/13/24 1900)   Most recent Weight: 64.6  kg (142 lb 8 oz) (10/03/24 0500)   Last Bowel Movement: 09/30/24 (10/03/24 0801)   Physical Exam  Middle-age male lying on the bed in no apparent distress.  Supplemental oxygen nasal cannula 4 L.  Neck: Supple  Chest: Tunneled dialysis catheter noted over the left upper chest.  Site appears clean.  Abdomen: Soft, nontender.  Extremities: Status post left AKA dressing intact.  Pitting edema over right lower extremity.  Firm swelling noted over the right upper arm/axilla consistent with hematoma noted on prior imaging 09/20/2024    Medications        Current Medications[1]         Signature     Electronically signed by: Vinay K Lingabathula, MD, 10/03/2024 3:12 PM                        [1]  Current Facility-Administered Medications   Medication Dose Route Frequency Provider Last Rate Last Admin    [START ON 10/04/2024] amLODIPine  (NORVASC ) tablet 10 mg  10 mg Oral Daily Lingabathula, Vinay K, MD        gabapentin  (NEURONTIN ) capsule 100 mg  100 mg Oral Q8H Lingabathula, Vinay K, MD   100 mg at 10/03/24 1308    insulin  lispro (HumaLOG ) injection 1-5 Units  1-5 Units Subcutaneous TID w/Meals & HS Lingabathula, Vinay K, MD   1 Units at 10/01/24 2113    mirtazapine (REMERON) tablet 7.5 mg  7.5 mg Oral HS Lingabathula, Vinay K, MD   7.5 mg at 10/02/24 2147    acetaminophen  (TYLENOL ) oral solution 1,000 mg  1,000 mg Oral Q6H Lingabathula, Vinay K, MD   1,000 mg at 10/03/24 1308    carvediloL (COREG) tablet 6.25 mg  6.25 mg Oral Q12H Lingabathula, Vinay K, MD   6.25 mg at 10/03/24 0854    apixaban (ELIQUIS) tablet 10 mg  10 mg Oral BID Lingabathula, Vinay K, MD   10 mg at 10/03/24 0854    Followed by    NOREEN ON 10/05/2024] apixaban (ELIQUIS) tablet 5 mg  5 mg Oral BID Lingabathula, Vinay K, MD        heparin  injection 5,000 Units  5,000 Units Instill To Dialysis-PRN Fayne Eulas POUR, MD   20,000 Units at 10/02/24 1308    amitriptyline (ELAVIL) tablet 10 mg  10  mg Oral BID Yashpal, Avneil S, MD   10 mg at 10/03/24 0854    aspirin  (ASA EC) enteric coated tablet 81 mg  81 mg Oral Daily Choudary, Carvel SQUIBB, MD   81 mg at 10/03/24 0854    NaCl 0.9 % 300 mL  300 mL Intravenous To Dialysis-PRN Salupo, Mabel Gully, DO        dextrose  50 % in water  (D50W) intravenous syringe 12.5 g  12.5 g IV Push Once PRN Pradarelli, Jason C, MD   12.5 g at 09/22/24 0537    ondansetron  (ZOFRAN ) injection 4 mg  4 mg IV Push Q6H PRN Pradarelli, Jason C, MD        Or    ondansetron  (ZOFRAN  ODT) RAPID DISSOLVING tablet 4 mg  4 mg Oral Q6H PRN Pradarelli, Jason C, MD        albuterol  (PROVENTIL ) 2.5 mg /3 mL (0.083 %) inhalation solution 2.5 mg  2.5 mg Inhalation PRN Pradarelli, Jason C, MD        oxyCODONE (ROXICODONE) IR tablet 5 mg  5 mg Oral Q4H PRN Pradarelli, Jason C, MD   5 mg  at 10/02/24 0946    Or    oxyCODONE (ROXICODONE) IR tablet 10 mg  10 mg Oral Q4H PRN Pradarelli, Jason C, MD   10 mg at 10/02/24 2147    dextrose  (GLUTOSE) gel 15 g Carb  15 g Carb Oral PRN Pradarelli, Jason C, MD   15 g Carb at 09/19/24 1605    glucagon  injection 1 mg  1 mg Subcutaneous PRN Pradarelli, Jason C, MD   1 mg at 09/18/24 2058    polyethylene glycol (MIRALAX ) packet 17 g  1 Packet Oral Daily Pradarelli, Jason C, MD   17 g at 10/03/24 0855    bisacodyL (DULCOLAX) enteric-coated tablet 5 mg  5 mg Oral Daily PRN Pradarelli, Jason C, MD        Or    bisacodyL (DULCOLAX) suppository 10 mg  10 mg Rectal Daily PRN Pradarelli, Jason C, MD       "

## 2024-10-04 NOTE — Progress Notes (Signed)
"  Social Work Progress Note    Discharge Plan: ECF - Scientist, Physiological Progress: ECF Placement confirmed. Patient has been accepted at [facility]. Patient with Medicare coverage and has met 3 night inpatient stay qualification for skilled rehab; does not require precert.     SW attended huddle and was updated on patient. SW spoke with APRN Laverda Minder who reports that patient will receive HD today. SW called Chad at [facility] to update on HD. Chad reports if patient discharges tomorrow, he will have to be discharged in the morning to receive afternoon HD. APRN notified. SW informed that patient can discharge today after HD and will not need HD again until Friday. Chad with [employer].       From Social Work perspective, patient's arrangements for discharge are confirmed. Social Work to follow to coordinate transfer when patient medically ready and necessary orders are obtained.     Electronically signed by: Yuvonne JINNY Pesa, ALEJANDRO, phone [phone], 10/04/2024 12:21 PM      Weekday Office Hours: 8:30am-5:00pm  Holiday/Weekends Hours: 8:30am-5:00pm, call [phone]  Urgent Needs After-Hours: 5:00pm-7:00pm every day, leave a message at [phone]  If after 7pm, please call [facility] [phone] or [facility] [phone]    "

## 2024-10-04 NOTE — Discharge Summary (Signed)
 "[facility]        PHP Transition of Care Form/Discharge Summary    DEMOGRAPHICS     PATIENT NAME:  Joe Avery DOB:  11-19-1960 MRN:  [id number]    DATE ADMITTED:  09/13/2024 DATE OF DISCHARGE:  10/04/2024    DISCHARGING PHYSICIAN AND CONTACT:  Brain Cherry       PHYSICIANS CONSULTED:  Treatment Team: Consulting Physician: Fayne Eulas POUR, MD     PCP:  Fernand Ruffing [phone]     FOLLOW UP APPOINTMENTS:   Celine Phi, MD  [address]  [place] [address]  [phone]    Follow up      Cheryn Rollo HERO, CNP  [address]  [address]  [address]  [place] [address]  [phone]    Follow up  Our office will contact you to arrange follow up for amputation site  incision check    [facility]  [address]  [place]  [address]  [phone]            DISCHARGE ORDERS  Discharge Procedure Orders   Other diet (Specify)   Order Comments: Oral Supplement       Freq: Supplement with all meals      Oral Supplement:   Glucerna Shake Strawberry   Regular Food - 7      Freq: 3 Meals/Day      Order:   Regular Food - 7     Wound Care   Order Comments: WOUND CARE: Left above knee amputation: Cleanse incision site with soapy water  or wound cleanser lightly, pat dry lightly. Apply silver mepilex dressing, this may stay in place for 3 days as long as it remains clean, dry and intact. If no silver mepilex dressing is available may place dry dressing and cleanse and change daily.     Up as Tolerated     Physical Therapy - Eval and Treat Patent Examiner)   Referral Priority: Routine Referral Type: PT/OT/ST   Referral Reason: Specialty Services Required   Requested Specialty: Physical Therapy   Number of Visits Requested: 1     Occupational Therapy - Eval And Treat [employer])   Referral Priority: Routine Referral Type: PT/OT/ST   Referral Reason: Specialty Services Required   Requested Specialty: Occupational Therapy   Number of Visits Requested: 1       MEDICAL  SYNOPSIS     REASON FOR ADMISSION/CHIEF COMPLAINT: Vascular insufficiency [I99.8]         PRINCIPAL DIAGNOSIS* AND HOSPITAL PROBLEM LIST:  Principal Problem:    Arterial embolism and thrombosis of lower extremity (HC CODE)  Active Problems:    Acute renal failure due to rhabdomyolysis    Vascular insufficiency    Compartment syndrome of left lower extremity      HOSPITAL COURSE AND TREATMENT:    Joe Avery is a 63 year old male, with a history of hypertension, COPD, diabetes mellitus type 2, CKD, presented to [facility] for evaluation of severe left leg pain, cold to touch.  CTA showed occluded left common femoral artery with heterogeneous musculature concerning for myonecrosis he was evaluated by vascular surgery team 11/4, status post catheter directed thrombolysis and started on dual antiplatelets postprocedure found to have AKI, hyperkalemia, and metabolic acidosis, CK greater than 22K was also on DKA protocol transition to subcu insulin  prior to transfer.Patient unfortunately developed compartment syndrome due to protracted episode of acute limb ischemia and was in septic shock and transferred to  [facility] main campus for further evaluation.     Problem based hospital course   Critical limb ischemia.  Status post catheter directed thrombolysis at outside hospital however given protracted ischemia developed compartment syndrome.  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  Vascular surgery team recommending full anticoagulation,  aspirin . was on heparin  drip transitioned to Eliquis 11/19.  Hemoglobin stable on anticoagulation.    AKI on CKD stage III.AKI likely ATN in the setting of rhabdomyolysis, shock.   Initiated on hemodialysis tunneled dialysis catheter placed on  11/18.  Remains oliguric/anuric, dialysis dependent at this time. Outpatient HD arrangements being arranged by social services  Appreciate nephrology input.     Nontraumatic rhabdomyolysis in the setting of critical limb ischemia.    Metabolic acidosis in the setting of AKI.    Diabetes mellitus type 2, uncontrolled based on last A1c of 10 from 09/14/2024.  However his oral intake has been poor and blood sugars well-controlled during this hospitalization.  Continue correction insulin .    Septic shock, POA secondary to left lower extremity ischemia with necrotizing infection.  Off antibiotics.  Off pressors.    Acute hypoxic respiratory failure POA requiring intubation off pressors.  Extubated on 11/7.  On 4 L supplemental oxygen via nasal cannula.  Wean oxygen as tolerated.    Acute blood loss anemia secondary to recent procedures.  Hemoglobin stable last few days 10.1 today.    Severe Malnutrition in context of Acute Illness/Injury (possibly on chronic malnutrition per 11/13), as evidenced by: No data recorded, per Aspen criteria.  Treatment plan:  oral supplement; per RDN, LD (Dietitian) nutritional assessment.  Clinical impact:  delayed wound healing, increased post discharge rehab needs.  started on Remeron 11/22.Oral intake seems to be improving slowly   Okay to liberalize to regular diet.  Discussed with nutritionist.  Encouraged to drink nutritional supplements.    Patient is otherwise cleared from nephrology standpoint for discharge to [facility] at [facility]    PROCEDURES PERFORMED:    -  Procedures (LRB):  ANGIOGRAM (Left)  FASCIOTOMY LOWER EXTREMITY (Left)  Surgeon  Sasson, Jordan R, MD  -------------------  -  Procedures (LRB):  AMPUTATION BELOW KNEE (Left)  Surgeon  Sasson, Jordan R, MD  -------------------  -  Procedures (LRB):  AMPUTATION REVISION/CLOSURE LEG (Left)  WOUND VAC APPLICATION/CHANGE (Left)  Surgeon  Sasson, Jordan R, MD  -------------------    MEDICATIONS:     Medication List         Start        Details   amitriptyline 10 mg tablet  Commonly known as: ELAVIL   Take 1 Tab by mouth in the morning and 1 Tab before bedtime.     amLODIPine  10 mg tablet  Commonly known as: NORVASC    Take 1 Tab by mouth in the morning.  Start taking on: October 05, 2024     apixaban 5 mg tablet  Commonly known as: ELIQUIS   Take 2 Tab by mouth two times a day for 1 day, THEN 1 Tab two times a day.  Start taking on: October 04, 2024     aspirin  81 mg enteric-coated tablet  Commonly known as: ASA EC   Take 1 Tab by mouth in the morning.  Start taking on: October 05, 2024  bisacodyL 5 mg enteric-coated tablet  Commonly known as: DULCOLAX   Take 1 Tab by mouth daily as needed.     carvediloL 6.25 mg tablet  Commonly known as: COREG   Take 1 Tab by mouth in the morning and 1 Tab before bedtime.     gabapentin  100 mg capsule  Commonly known as: NEURONTIN    Take 1 Cap by mouth in the morning and 1 Cap at noon and 1 Cap before bedtime.  Doctor's comments: Days' Supply = 30     mirtazapine 7.5 mg tablet  Commonly known as: REMERON   Take 1 Tab by mouth at bedtime.     oxyCODONE 5 mg tablet  Commonly known as: ROXICODONE   Take 1 Tab by mouth every 8 hours as needed.  Doctor's comments: Days' Supply = 5     polyethylene glycol 17 gram packet  Commonly known as: MIRALAX    Take 17 g by mouth in the morning.  Start taking on: October 05, 2024               Where to Get Your Medications        These medications were sent to [facility] - [place], [place] - [address]  [address], [place] [address]      Phone: [phone]   amitriptyline 10 mg tablet  amLODIPine  10 mg tablet  apixaban 5 mg tablet  aspirin  81 mg enteric-coated tablet  bisacodyL 5 mg enteric-coated tablet  carvediloL 6.25 mg tablet  gabapentin  100 mg capsule  mirtazapine 7.5 mg tablet  oxyCODONE 5 mg tablet  polyethylene glycol 17 gram packet       ALLERGIES and INTOLERANCES:  Allergies[1]    DATA     LAST SET OF VITALS:    BP:  138/67  Temp: 98 F (36.7 C)  Pulse: 97  Resp: 16  Height: 175.3 cm (5' 9)  Body Surface Area: 1.8  Body Mass Index: 21.57  Last Weight   10/04/24 66.3 kg (146 lb 1.6 oz)       Intake/Output Summary (Last 24 hours) at 10/04/2024 1342  Last data filed at 10/04/2024 0600  Gross per 24 hour   Intake 450 ml   Output 150 ml   Net 300 ml        Physical Exam  CONST: Not in distress      EYES: Extraocular movement intact     NECK: Supple     RESP: clear to auscultation     CARD: S1, S2     ABDOMEN: Soft, nontender     EXTREMITIES: Status post AKA left     NEURO/PSYCH: Awake and alert x3 no focal deficit  FINAL BASIC LABS:  CMP or BMP:   Lab Results   Component Value Date    NA 138 10/04/2024    POTASSIUM 4.2 10/04/2024    POTASSIUM 5.6 (H) 09/13/2024    CL 98 10/04/2024    CO2 26 10/04/2024    GLUCOSE 124 (H) 10/04/2024    GLUCOSE 124 (H) 10/04/2024    BUN 20 10/04/2024    CREATININE 4.7 (H) 10/04/2024    CA 8.5 10/04/2024    TP 4.2 (L) 09/13/2024    ALB 2.5 (L) 10/04/2024    ALKP 64 09/13/2024    AST 1,118 (H) 09/13/2024    ALT 235 (H) 09/13/2024    TBIL 0.3 09/13/2024     CBC:   Lab Results   Component Value Date  WBC 9.2 10/04/2024    HEMOGLOBIN 11.4 (L) 10/04/2024    HEMATOCRIT 36.2 (L) 10/04/2024    PLATELETS 508 (H) 10/04/2024    MCV 93.8 10/04/2024    MCH 29.5 10/04/2024    MCHC 31.5 10/04/2024    RDW 17.5 (H) 10/04/2024    MPV 9.8 10/04/2024      PT/PTT/INR:   Lab Results   Component Value Date    INR 1.3 (H) 09/27/2024       PATIENT STATUS     ADVANCE DIRECTIVES   Durable Power of Attorney:  Yes    Living Will:       Code Status Information     Code Status    Total Support         SOCIAL DETERMINANTS OF HEALTH       Patient screened for food insecurity,housing instability, transportation needs, utility difficulties and interpersonal safety    PLANNED INTERVENTIONS  Home Care Agency:    Name:     Phone Number:       Assessment of Family & Caregiver Status:     DISPOSITION     DISPOSITION:  ECF  CONDITION AT  DISCHARGE:  good    35 Minutes spent in preparation of discharging this patient including face to face encounter, discussions with the patient and family, medication reconciliation, and transition of care preparation.     Electronically signed by: Brain Cherry, MD, 10/04/2024 1:42 PM              [1]  No Known Allergies  "

## 2024-10-04 NOTE — Procedures (Signed)
"     [facility]    Patient Information:  10/04/2024   Patient Name: Joe Avery     [id number]    DOB: 1961-08-01     Admitting diagonsis: Vascular insufficiency [I99.8]     Hemodialysis Access Type: Lt TDC  Last dressing change date of CVC (N/A if no CVC): 10/04/24    Dialysis Settings  Dialyzer Type: F180NRe (10/04/24 1315)   QB (Blood Flow): 380 MILLILITERS/MINUTE (10/04/24 1645)   QD (Dialysate Flow): 600 MILLILITERS/MINUTE (10/04/24 1645)   Ultrafiltration Program: 0 (10/04/24 1645)       Bath Bicarb: 35 (10/04/24 1315)   Bath Potassium (K+): 3 (10/04/24 1315)   Bath Sodium: 140 (10/04/24 1315)   Bath Calcium  (Ca+): 2.5 (10/04/24 1315)       Last Blood Pressure:  BP: (!) 147/93 (10/04/24 1711)  Last Temperature: Temp: 98.1 F (36.7 C) (10/04/24 1711)    Predialysis Weight:Weight: 62.1 kg (136 lb 14.5 oz) (10/04/24 1351)  Postdialysis Weight:Post Weight: 59.6 kg (131 lb 6.3 oz) (10/04/24 1711)    Number of hours dialyzed: 3    Ultrafiltrated Amount:  Gross U.F. (mL):Gross U.F. (mL): 2800 mL (10/04/24 1645)  Net U.F. (mL):Net U.F. (mL): 2500 mL (10/04/24 1645)    Patient Tolerated Procedure:  well    Base\Dry Weight:  Base/Dry Weight:  (TBD) (10/04/24 1351)        Latest Values:  Temp: 98.1 F (36.7 C) (10/04/24 1711) Temp  Avg: 98 F (36.7 C)  Min: 97.5 F (36.4 C)  Max: 98.7 F (37.1 C) BP: (!) 147/93 (10/04/24 1711) Pulse: 95 (10/04/24 1711) Resp: 17 (10/04/24 1711) SpO2: 95 % (10/04/24 1711)     Oxygen Requirement: Oxygen Liters Per Minute: 4 LITERS PER MINUTE (10/04/24 1226)    Pain:   Numeric/FACES Pain Level: 5 (10/04/24 1400) Pain Location: Left;Foot (phantom pain) (10/04/24 1400)       Anemia Therapy Drug given: No  Name of Medication and Dose: N/A  Last Hemoglobin and date drawn: 11.4 on 10/04/24     Report Called to Floor Post Treatment: Yes    ADDITIONAL NOTES: Pt slept and asymptomatic through treatment, vital signs per chart. Post treatment report given to bedside RN, [facility] via  secure chat.     Electronically signed by: Lige Melia LELON Lajuana, RN, 10/04/2024 5:21 PM                           "

## 2024-10-08 ENCOUNTER — Inpatient Hospital Stay
Admission: EM | Admit: 2024-10-08 | Discharge: 2024-10-08 | Disposition: A | Discharge status: Skilled Nursing Facility | Payer: MEDICARE | Arrived: AM | Attending: Student in an Organized Health Care Education/Training Program | Admitting: Student in an Organized Health Care Education/Training Program

## 2024-10-08 ENCOUNTER — Inpatient Hospital Stay: Admit: 2024-10-08 | Payer: MEDICARE | Primary: Internal Medicine

## 2024-10-08 DIAGNOSIS — K922 Gastrointestinal hemorrhage, unspecified: Secondary | ICD-10-CM

## 2024-10-08 DIAGNOSIS — D6832 Hemorrhagic disorder due to extrinsic circulating anticoagulants: Secondary | ICD-10-CM

## 2024-10-08 LAB — CBC WITH AUTO DIFFERENTIAL
Basophils %: 1 % (ref 0–1)
Basophils Absolute: 0.08 k/uL
Eosinophils %: 1 % (ref 0.0–3.0)
Eosinophils Absolute: 0.08 k/uL
Hematocrit: 16.6 % — ABNORMAL LOW (ref 42.0–52.0)
Hemoglobin: 5.2 g/dL — CL (ref 13.5–18.0)
Immature Granulocytes %: 1 % — ABNORMAL HIGH
Immature Granulocytes Absolute: 0.08 k/uL
Lymphocytes %: 12 % — ABNORMAL LOW (ref 24–44)
Lymphocytes Absolute: 1.02 k/uL
MCH: 29.7 pg (ref 27.0–31.0)
MCHC: 31.3 g/dL — ABNORMAL LOW (ref 32.0–36.0)
MCV: 94.9 fL (ref 78.0–100.0)
MPV: 10.5 fL (ref 7.5–11.1)
Monocytes %: 7 % — ABNORMAL HIGH (ref 0–5)
Monocytes Absolute: 0.57 k/uL
Neutrophils %: 79 % — ABNORMAL HIGH (ref 36–66)
Neutrophils Absolute: 6.62 k/uL
Platelets: 364 k/uL (ref 140–440)
RBC: 1.75 m/uL — ABNORMAL LOW (ref 4.60–6.20)
RDW: 17 % — ABNORMAL HIGH (ref 11.7–14.9)
WBC: 8.5 k/uL (ref 4.0–10.5)

## 2024-10-08 LAB — COMPREHENSIVE METABOLIC PANEL
ALT: 25 U/L (ref 10–40)
AST: 34 U/L (ref 15–37)
Albumin/Globulin Ratio: 1.3 (ref 1.1–2.2)
Albumin: 2.5 g/dL — ABNORMAL LOW (ref 3.4–5.0)
Alkaline Phosphatase: 124 U/L (ref 40–129)
Anion Gap: 12 mmol/L (ref 9–17)
BUN: 46 mg/dL — ABNORMAL HIGH (ref 7–20)
CO2: 25 mmol/L (ref 21–32)
Calcium: 7.4 mg/dL — ABNORMAL LOW (ref 8.3–10.6)
Chloride: 99 mmol/L (ref 99–110)
Creatinine: 4.1 mg/dL — ABNORMAL HIGH (ref 0.8–1.3)
Est, Glom Filt Rate: 15 mL/min/1.73m2 — ABNORMAL LOW (ref >60)
Glucose: 203 mg/dL — ABNORMAL HIGH (ref 74–99)
Potassium: 4.3 mmol/L (ref 3.5–5.1)
Sodium: 135 mmol/L — ABNORMAL LOW (ref 136–145)
Total Bilirubin: 0.3 mg/dL (ref 0.0–1.0)
Total Protein: 4.4 g/dL — ABNORMAL LOW (ref 6.4–8.2)

## 2024-10-08 LAB — FERRITIN: Ferritin: 966 ng/mL — ABNORMAL HIGH (ref 30–400)

## 2024-10-08 LAB — LIPASE: Lipase: 13 U/L (ref 13–60)

## 2024-10-08 LAB — PROTIME-INR
INR: 1.9
Protime: 22.7 s — ABNORMAL HIGH (ref 11.7–14.5)

## 2024-10-08 LAB — MAGNESIUM: Magnesium: 1.7 mg/dL — ABNORMAL LOW (ref 1.8–2.4)

## 2024-10-08 LAB — POCT GLUCOSE: POC Glucose: 196 mg/dL — ABNORMAL HIGH (ref 74–99)

## 2024-10-08 MED ORDER — ONDANSETRON HCL 4 MG/2ML IJ SOLN
4 | Freq: Four times a day (QID) | INTRAMUSCULAR | Status: DC | PRN
Start: 2024-10-08 — End: 2024-10-13

## 2024-10-08 MED ORDER — PANTOPRAZOLE SODIUM 40 MG IV SOLR
40 | Freq: Two times a day (BID) | INTRAVENOUS | Status: DC
Start: 2024-10-08 — End: 2024-10-08

## 2024-10-08 MED ORDER — NORMAL SALINE FLUSH 0.9 % IV SOLN
0.9 | INTRAVENOUS | Status: DC | PRN
Start: 2024-10-08 — End: 2024-10-13

## 2024-10-08 MED ORDER — NORMAL SALINE FLUSH 0.9 % IV SOLN
0.9 | Freq: Two times a day (BID) | INTRAVENOUS | Status: DC
Start: 2024-10-08 — End: 2024-10-13
  Administered 2024-10-09 – 2024-10-13 (×9): 10 mL via INTRAVENOUS

## 2024-10-08 MED ORDER — LISINOPRIL 20 MG PO TABS
20 | Freq: Every day | ORAL | Status: DC
Start: 2024-10-08 — End: 2024-10-10

## 2024-10-08 MED ORDER — ATORVASTATIN CALCIUM 40 MG PO TABS
40 | Freq: Every evening | ORAL | Status: DC
Start: 2024-10-08 — End: 2024-10-13

## 2024-10-08 MED ORDER — DEXTROSE 10 % IV BOLUS
INTRAVENOUS | Status: DC | PRN
Start: 2024-10-08 — End: 2024-10-13

## 2024-10-08 MED ORDER — MAGNESIUM SULFATE IN D5W 1-5 GM/100ML-% IV SOLN
1-5 | Freq: Once | INTRAVENOUS | Status: DC
Start: 2024-10-08 — End: 2024-10-08

## 2024-10-08 MED ORDER — AMLODIPINE BESYLATE 10 MG PO TABS
10 | Freq: Every day | ORAL | Status: DC
Start: 2024-10-08 — End: 2024-10-10

## 2024-10-08 MED ORDER — DEXTROSE 10 % IV SOLN
10 | INTRAVENOUS | Status: DC | PRN
Start: 2024-10-08 — End: 2024-10-13

## 2024-10-08 MED ORDER — GLUCOSE 4 G PO CHEW
4 | ORAL | Status: DC | PRN
Start: 2024-10-08 — End: 2024-10-13

## 2024-10-08 MED ORDER — ASPIRIN 81 MG PO CHEW
81 | Freq: Every day | ORAL | Status: DC
Start: 2024-10-08 — End: 2024-10-13

## 2024-10-08 MED ORDER — GLUCAGON HCL (DIAGNOSTIC) 1 MG IJ SOLR
1 | INTRAMUSCULAR | Status: DC | PRN
Start: 2024-10-08 — End: 2024-10-13

## 2024-10-08 MED ORDER — SODIUM CHLORIDE 0.9 % IV SOLN
0.9 | INTRAVENOUS | Status: DC | PRN
Start: 2024-10-08 — End: 2024-10-13

## 2024-10-08 MED ORDER — PANTOPRAZOLE SODIUM 40 MG IV SOLR
40 | Freq: Once | INTRAVENOUS | Status: AC
Start: 2024-10-08 — End: 2024-10-08
  Administered 2024-10-08: 18:00:00 80 mg via INTRAVENOUS

## 2024-10-08 MED ORDER — ACETAMINOPHEN 325 MG PO TABS
325 | Freq: Four times a day (QID) | ORAL | Status: DC | PRN
Start: 2024-10-08 — End: 2024-10-13
  Administered 2024-10-09 – 2024-10-13 (×8): 650 mg via ORAL

## 2024-10-08 MED ORDER — ONDANSETRON 4 MG PO TBDP
4 | Freq: Three times a day (TID) | ORAL | Status: DC | PRN
Start: 2024-10-08 — End: 2024-10-13
  Administered 2024-10-10: 4 mg via ORAL

## 2024-10-08 MED ORDER — MAGNESIUM SULFATE IN D5W 1-5 GM/100ML-% IV SOLN
1-5 | Freq: Once | INTRAVENOUS | Status: AC
Start: 2024-10-08 — End: 2024-10-08
  Administered 2024-10-08: 21:00:00 1000 mg via INTRAVENOUS

## 2024-10-08 MED ORDER — ACETAMINOPHEN 650 MG RE SUPP
650 | Freq: Four times a day (QID) | RECTAL | Status: DC | PRN
Start: 2024-10-08 — End: 2024-10-13

## 2024-10-08 MED ORDER — PANTOPRAZOLE SODIUM 40 MG IV SOLR
40 | Freq: Two times a day (BID) | INTRAVENOUS | Status: DC
Start: 2024-10-08 — End: 2024-10-13
  Administered 2024-10-09 – 2024-10-13 (×10): 40 mg via INTRAVENOUS

## 2024-10-08 MED ORDER — GABAPENTIN 100 MG PO CAPS
100 | Freq: Two times a day (BID) | ORAL | Status: DC
Start: 2024-10-08 — End: 2024-10-10
  Administered 2024-10-09: 01:00:00 100 mg via ORAL

## 2024-10-08 MED ORDER — INSULIN LISPRO 100 UNIT/ML IJ SOLN
100 | Freq: Four times a day (QID) | INTRAMUSCULAR | Status: DC
Start: 2024-10-08 — End: 2024-10-13
  Administered 2024-10-08 – 2024-10-12 (×5): 1 [IU] via SUBCUTANEOUS

## 2024-10-08 MED FILL — INSULIN LISPRO 100 UNIT/ML IJ SOLN: 100 [IU]/mL | INTRAMUSCULAR | Qty: 1 | Fill #0

## 2024-10-08 MED FILL — MAGNESIUM SULFATE IN D5W 1-5 GM/100ML-% IV SOLN: 1-5 GM/100ML-% | INTRAVENOUS | Qty: 100 | Fill #0

## 2024-10-08 MED FILL — PANTOPRAZOLE SODIUM 40 MG IV SOLR: 40 mg | INTRAVENOUS | Qty: 80 | Fill #0

## 2024-10-08 MED FILL — BD POSIFLUSH 0.9 % IV SOLN: 0.9 % | INTRAVENOUS | Qty: 40 | Fill #0

## 2024-10-08 NOTE — ED Triage Notes (Signed)
"  Arrived via EMS, from [facility]. States that when staff cleaned him up this morning, he had a dark, tarry black stool. Smear was positive for blood.  "

## 2024-10-08 NOTE — Progress Notes (Signed)
"  4 Eyes Skin Assessment     NAME:  Joe Avery  DATE OF BIRTH:  29-Nov-1960  MEDICAL RECORD NUMBER:  [id number]    The patient is being assessed for  Admission    I agree that at least one RN has performed a thorough Head to Toe Skin Assessment on the patient. ALL assessment sites listed below have been assessed.      Areas assessed by both nurses:    Head, Face, Ears, Shoulders, Back, Chest, Arms, Elbows, Hands, Sacrum. Buttock, Coccyx, Ischium, Legs. Feet and Heels, and Under Medical Devices         Does the Patient have a Wound? Yes wound(s) were present on assessment. LDA wound assessment was Initiated and completed by RN- surgical site L aka- sutures, right groin, abrasion on right lat thigh       Braden Prevention initiated by RN: Yes  Wound Care Orders initiated by RN: Yes    For hospital-acquired stage 1 & 2 and ALL Stage 3,4, Unstageable, DTI, NWPT, and Complex wounds: place order IP Wound Care/Ostomy Nurse Eval and Treat by RN under ORDER ENTRY: NA    New Ostomies, if present place, Ostomy referral order under ORDER ENTRY: No     Nurse 1 eSignature: Electronically signed by Rosina Benders, RN on 10/08/24 at 2:51 PM EST    **SHARE this note so that the co-signing nurse can place an eSignature**    Nurse 2 eSignature: Electronically signed by Tinnie Files, RN on 10/08/24 at 3:21 PM EST    "

## 2024-10-08 NOTE — Plan of Care (Signed)
"    Problem: Chronic Conditions and Co-morbidities  Goal: Patient's chronic conditions and co-morbidity symptoms are monitored and maintained or improved  10/08/2024 1624 by Roxan Maxwell, RN  Outcome: Progressing  10/08/2024 1504 by Roxan Maxwell, RN  Outcome: Progressing     Problem: Discharge Planning  Goal: Discharge to home or other facility with appropriate resources  10/08/2024 1624 by Roxan Maxwell, RN  Outcome: Progressing  10/08/2024 1504 by Roxan Maxwell, RN  Outcome: Progressing     Problem: Pain  Goal: Verbalizes/displays adequate comfort level or baseline comfort level  10/08/2024 1624 by Roxan Maxwell, RN  Outcome: Progressing  10/08/2024 1504 by Roxan Maxwell, RN  Outcome: Progressing     Problem: Skin/Tissue Integrity  Goal: Skin integrity remains intact  Description: 1.  Monitor for areas of redness and/or skin breakdown  2.  Assess vascular access sites hourly  3.  Every 4-6 hours minimum:  Change oxygen saturation probe site  4.  Every 4-6 hours:  If on nasal continuous positive airway pressure, respiratory therapy assess nares and determine need for appliance change or resting period  Outcome: Progressing  Flowsheets (Taken 10/08/2024 1623)  Skin Integrity Remains Intact: Monitor for areas of redness and/or skin breakdown     Problem: Safety - Adult  Goal: Free from fall injury  Outcome: Progressing     "

## 2024-10-08 NOTE — Consults (Signed)
"  CONSULT [id number]  "

## 2024-10-08 NOTE — ED Provider Notes (Signed)
 "      Emergency Department Encounter      Patient: Joe Avery  MRN: [id number]  DOB: [birth year]  Date of Evaluation: 10/08/2024  PCP: Fernand Ruffing, MD  ED Provider:  Bettyann Gosling, DO    Triage Chief Complaint:    Rectal Bleeding    HPI:   Joe Avery is a 63 y.o. male that presents to the emergency department with concerns for melanotic stool.  Patient currently resides in [place] view status post complicated hospitalization as discussed below.  When the nursing staff was changing his diaper they noticed that he had dark black tarry stools and therefore sent patient to the emergency department for further evaluation.  Patient himself states he feels fine.  He declines any lightheadedness dizziness.  He declines any abdominal pain nausea or vomiting.  States he is not eating as much is normal because the food that they are feeding him does not very good but he is still hungry.  Declines any associated chest pain shortness of breath or difficulty breathing.  Has been compliant with his medications including Eliquis.  Declines vomiting any blood.  Does not drink alcohol.    Note reviewed from 10/04/2024, patient with a history of hypertension, COPD, diabetes, CKD.  Presented with left flank pain.  CTA showed occluded left common femoral artery and underwent catheter directed thrombolysis and started on dual antiplatelets.  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  Patient transition to Eliquis on 11/19.        History from : Patient    Limitations to history : None    MDM/ED Course:       In brief,     Pleasant 63 year old male presenting to the emergency department as above.  Arrives mildly tachycardic but otherwise hemodynamically stable.  MAP greater than 65.  Mentating appropriately.  Concern for melanotic stools.  Recently started on  anticoagulation secondary to lower extremity ischemia.  On arrival, patient is asymptomatic.  Based off of his presentation, the below workup was initiated.  CBC confirms acute blood loss anemia with a hemoglobin of 5.2.  Based off my rectal exam, this is melanotic stool, concerning for GI bleed.  Protonix  provided.  Consent obtained and will transfuse.  GI consulted.  Agreeable with medical management, no need to reverse anticoagulation.  INR 1.9.  Magnesium  replaced.  Complete metabolic panel does show evidence of acute kidney injury with a creatinine of 4.1 however his potassium and remainder of electrolytes are grossly unremarkable.  Consider prerenal in the setting of severe anemia.  Will hold off on IV fluid resuscitation to prevent worsening dilution in the setting of severe anemia.  Patient remains without any abdominal pain, there is no bright red blood per rectum.  At this point I do not believe CTA of the abdomen pelvis is warranted however did consider.  Patient updated with plans for admission and is agreeable.  Message sent to the hospitalist.      ED Course as of 10/08/24 1408   Sat Oct 08, 2024   1324 Notified of Hgb. Patient updated. Mentation is normal. Remains asymptomatic. Blood transfusion ordered. GI consulted.  [CR]   1334 Spoke with Dr. Denis who is aware. No need to reverse anticoagulation.  [CR]      ED Course User Index  [CR] Gosling Bettyann, DO  EKG (if obtained): (All EKG's are interpreted by myself in the absence of a cardiologist) none        Physical Exam:   Patient Vitals for the past 24 hrs:   BP Temp Temp src Pulse Resp SpO2 Height Weight   10/08/24 1345 -- -- -- (!) 104 -- -- -- --   10/08/24 1221 (!) 95/58 98.2 F (36.8 C) Oral (!) 111 16 96 % 1.753 m (5' 9) 50.8 kg (112 lb)       Physical Exam  Vitals reviewed.   HENT:      Head: Normocephalic and atraumatic.      Right Ear: External ear normal.      Left Ear: External ear normal.   Eyes:      General: Lids are normal.    Cardiovascular:      Rate and Rhythm: Regular rhythm. Tachycardia present.      Pulses:           Radial pulses are 2+ on the right side and 2+ on the left side.        Dorsalis pedis pulses are 2+ on the right side and 2+ on the left side.      Heart sounds: Normal heart sounds.   Pulmonary:      Effort: No accessory muscle usage or respiratory distress.   Abdominal:      Palpations: Abdomen is soft.      Comments: No peritoneal signs    Genitourinary:     Comments: Verbal consent obtained from the patient, chaperone present at bedside, patient with maroon/melanotic stool present.  No bright red blood or hemorrhoids appreciated.  No lacerations appreciated.  Musculoskeletal:      Cervical back: Neck supple. No rigidity.      Comments: No asymmetric calf swelling.  Status post left AKA, sutures well-approximated.  Compartments are compressible throughout.  Incision site is clean dry and intact.   Skin:     Comments: Pale skin.   Neurological:      Mental Status: He is alert. Mental status is at baseline.           ROS:  10 systems reviewed and negative except as above.     Past Medical History:   Diagnosis Date    Anemia due to GI blood loss 10/08/2024    Hypertension     Type 2 diabetes mellitus without complication, without long-term current use of insulin  (HCC) 08/11/2023     Past Surgical History:   Procedure Laterality Date    CARDIAC PROCEDURE N/A 09/12/2024    Peripheral angiography performed by Bertell Clarine Nurse, MD at [facility] CARDIAC CATH LAB    COLONOSCOPY N/A 02/23/2024    COLONOSCOPY POLYPECTOMY SNARE/BIOPSY performed by Madie Dine, MD at [facility] ENDOSCOPY    INVASIVE VASCULAR N/A 09/12/2024    Thrombectomy peripheral artery performed by Bertell Clarine Nurse, MD at [facility] CARDIAC CATH LAB    INVASIVE VASCULAR N/A 09/13/2024    Thrombolysis peripheral artery performed by Merrianne Deatrice GORMAN DELENA, MD at [facility] CARDIAC CATH LAB     Family History   Problem Relation Age of Onset    Diabetes Father      Social History      Socioeconomic History    Marital status: Single     Spouse name: Not on file    Number of children: Not on file    Years of education: Not on file    Highest education level: Not on file  Occupational History    Not on file   Tobacco Use    Smoking status: Every Day     Current packs/day: 0.10     Average packs/day: 0.5 packs/day for 44.9 years (21.7 ttl pk-yrs)     Types: Cigarettes     Start date: 39    Smokeless tobacco: Never    Tobacco comments:     Down to 3 per day   Vaping Use    Vaping status: Never Used   Substance and Sexual Activity    Alcohol use: Never    Drug use: Not Currently    Sexual activity: Not on file   Other Topics Concern    Not on file   Social History Narrative    Not on file     Social Drivers of Health     Financial Resource Strain: Not on file   Food Insecurity: No Food Insecurity (09/13/2024)    Received from [facility]    Hunger Vital Sign     Within the past 12 months, you worried that your food would run out before you got the money to buy more.: Never true     Within the past 12 months, the food you bought just didn't last and you didn't have money to get more.: Never true   Transportation Needs: No Transportation Needs (09/13/2024)    Received from [employer] - Transportation     In the past 12 months, has lack of transportation kept you from medical appointments or from getting medications?: No     In the past 12 months, has lack of transportation kept you from meetings, work, or from getting things needed for daily living?: No   Physical Activity: Not on file   Stress: Not on file   Social Connections: Not on file   Intimate Partner Violence: Not At Risk (09/13/2024)    Received from [employer], Afraid, Rape, and Kick questionnaire     Within the last year, have you been afraid of your partner or ex-partner?: No     Within the last year, have you been humiliated or emotionally abused in other ways by your partner or ex-partner?: No      Within the last year, have you been kicked, hit, slapped, or otherwise physically hurt by your partner or ex-partner?: No     Within the last year, have you been raped or forced to have any kind of sexual activity by your partner or ex-partner?: No   Housing Stability: Low Risk (09/13/2024)    Received from [employer] Stability Vital Sign     In the last 12 months, was there a time when you were not able to pay the mortgage or rent on time?: No     In the past 12 months, how many times have you moved where you were living?: 0     At any time in the past 12 months, were you homeless or living in a shelter (including now)?: No     Current Facility-Administered Medications   Medication Dose Route Frequency Provider Last Rate Last Admin    0.9 % sodium chloride  infusion   IntraVENous PRN Sharl Aran, DO        magnesium  sulfate 1000 mg in dextrose  5% 100 mL IVPB  1,000 mg IntraVENous Once Sharl Aran, DO        pantoprazole  (PROTONIX ) injection 40 mg  40 mg IntraVENous  BID Denis Lerner, MD         Current Outpatient Medications   Medication Sig Dispense Refill    aspirin  81 MG chewable tablet Take 1 tablet by mouth daily (Patient not taking: Reported on 05/18/2024) 30 tablet 3    atorvastatin  (LIPITOR ) 40 MG tablet Take 1 tablet by mouth nightly 30 tablet 3    metFORMIN (GLUCOPHAGE) 500 MG tablet Take 1 tablet by mouth daily (with breakfast)      raNITIdine HCl (RANITIDINE 150 MAX STRENGTH PO) Take by mouth as needed      Aspirin -Acetaminophen -Caffeine (EXCEDRIN PO) Take by mouth as needed      gabapentin  (NEURONTIN ) 100 MG capsule Take 1 capsule by mouth in the morning and at bedtime. 60 capsule 11    amLODIPine  (NORVASC ) 10 MG tablet Take 1 tablet by mouth daily      lisinopril  (PRINIVIL ;ZESTRIL ) 40 MG tablet Take 1 tablet by mouth daily       No Known Allergies    Nursing Notes Reviewed      I have reviewed and interpreted all of the currently available lab results from this visit (if  applicable):  Results for orders placed or performed during the hospital encounter of 10/08/24   CBC with Auto Differential   Result Value Ref Range    WBC 8.5 4.0 - 10.5 k/uL    RBC 1.75 (L) 4.60 - 6.20 m/uL    Hemoglobin 5.2 (LL) 13.5 - 18.0 g/dL    Hematocrit 83.3 (L) 42.0 - 52.0 %    MCV 94.9 78.0 - 100.0 fL    MCH 29.7 27.0 - 31.0 pg    MCHC 31.3 (L) 32.0 - 36.0 g/dL    RDW 82.9 (H) 88.2 - 14.9 %    Platelets 364 140 - 440 k/uL    MPV 10.5 7.5 - 11.1 fL    Neutrophils % 79 (H) 36 - 66 %    Lymphocytes % 12 (L) 24 - 44 %    Monocytes % 7 (H) 0 - 5 %    Eosinophils % 1 0.0 - 3.0 %    Basophils % 1 0 - 1 %    Immature Granulocytes % 1 (H) 0 %    Neutrophils Absolute 6.62 k/uL    Lymphocytes Absolute 1.02 k/uL    Monocytes Absolute 0.57 k/uL    Eosinophils Absolute 0.08 k/uL    Basophils Absolute 0.08 k/uL    Immature Granulocytes Absolute 0.08 k/uL   CMP   Result Value Ref Range    Sodium 135 (L) 136 - 145 mmol/L    Potassium 4.3 3.5 - 5.1 mmol/L    Chloride 99 99 - 110 mmol/L    CO2 25 21 - 32 mmol/L    Anion Gap 12 9 - 17 mmol/L    Glucose 203 (H) 74 - 99 mg/dL    BUN 46 (H) 7 - 20 mg/dL    Creatinine 4.1 (H) 0.8 - 1.3 mg/dL    Est, Glom Filt Rate 15 (L) >60 mL/min/1.18m2    Calcium  7.4 (L) 8.3 - 10.6 mg/dL    Total Protein 4.4 (L) 6.4 - 8.2 g/dL    Albumin 2.5 (L) 3.4 - 5.0 g/dL    Albumin/Globulin Ratio 1.3 1.1 - 2.2    Total Bilirubin 0.3 0.0 - 1.0 mg/dL    Alkaline Phosphatase 124 40 - 129 U/L    ALT 25 10 - 40 U/L    AST 34 15 - 37 U/L  Protime-INR   Result Value Ref Range    Protime 22.7 (H) 11.7 - 14.5 sec    INR 1.9    Magnesium    Result Value Ref Range    Magnesium  1.7 (L) 1.8 - 2.4 mg/dL   Lipase   Result Value Ref Range    Lipase 13 13 - 60 U/L      Radiographs (if obtained):  []  The following radiograph was interpreted by myself in the absence of a radiologist:   []  Radiologist's Report Reviewed:  No orders to display     Radiologist's Report Reviewed:  No results found.    No results found.      Procedures:  None    Critical Care Time:  The services I provided to this patient were to treat and/or prevent clinically significant deterioration that could result in the failure of one or more body systems and/or organ systems.    Services included the following:  -reviewing nursing notes and old charts  -vital sign assessments  -direct patient care  -medication orders and management  -interpreting and reviewing diagnostic studies/labs  -re-evaluations  -documentation time    Aggregate critical care time was 35 minutes, which includes only time during which I was engaged in work directly related to the patient's care as described above, whether I was at bedside or elsewhere in the Emergency Department. It did not include time spent performing other reported procedures or the services of residents, students, nurses, or advance practice providers.      Chart review shows recent radiographs:  Invasive vascular procedure  Addendum Date: 09/13/2024  Successful thrombectomy of left iliac artery left SFA left common femoral artery left brachial artery and successful angioplasty of left iliac artery, left SFA left popliteal and ATA     Result Date: 09/13/2024  Successful thrombectomy of left iliac artery left SFA left common femoral artery left brachial artery and successful angioplasty of left iliac artery, left SFA left popliteal and ATA     CTA LOWER EXTREMITY LEFT W CONTRAST  Result Date: 09/13/2024  CTA LOWER EXTREMITY LEFT W CONTRAST , 09/13/2024 4:36 HISTORY: Arterial occlusion COMPARISON: None CT radiation dose optimization techniques (automated exposure control, use of iterative reconstruction techniques, or adjustment of the mA and/or kV according  to patient size) were used to limit patient radiation dose. IOPAMIDOL  76 % IV SOLN 75mL Scout topogram: No additional finding 3-D CT angiography technique. Abundant contrast in the urinary bladder. This is from a recent procedure. Enlarged prostate. There is an  intra-arterial catheter seen in the common iliac artery bilaterally which appears to terminate in the region of left popliteal artery. Perhaps trace contrast demonstrated within common femoral artery. Otherwise no significant contrast opacification. Calcification of the popliteal artery. There is no runoff to the foot. Heterogeneous appearance of the musculature of the leg both in the thigh and below the knee may be due to regions of ischemic muscle and myonecrosis. Low density within the pectineus muscle and adjacent groin musculature compatible with areas of necrosis. The bones show no significant abnormality IMPRESSION: 1.  Intra-arterial catheter. Occluded arteries to the left lower extremity. Occluded from common femoral artery distally. 2.  Heterogeneous musculature throughout the lower extremity compatible with ischemia and developing myonecrosis Report was called to ICU Darryll) and discussed with nurse Kristin by phone on 09/13/2024 5:01 . See above for complete and additional details.   Dictated and Electronically Signed By: Fairy Maffucci, MD [facility] 09/13/2024 5:04  Vascular duplex lower extremity arteries left  Result Date: 09/12/2024  VAS DUP LOWER EXTREMITY ARTERIES LEFT EXAM DATE: 09/12/2024 17:35 DEMOGRAPHICS: 63 years old Male INDICATION: Acute limb ischemia COMPARISON: No existing relevant imaging study corresponding to the same anatomical region is available. TECHNIQUE: Left lower extremity Doppler waveforms and velocities were obtained FINDINGS: Left lower extremity: Velocities (cm/s ) and Waveforms: Common femoral artery:  - 22 Monophasic. Profunda femoris:  Not identified. Superficial femoral:  - proximal : 44 The mid and distal segments appear occluded. Popliteal artery :  The popliteal artery appears occluded. Posterior tibial artery :  The posterior tibial artery appears occluded. Anterior tibial artery :  The anterior tibial artery appears occluded. IMPRESSION:  Occluded left lower extremity arterial vasculature from the mid segment of the superficial femoral artery.  Dictated and Electronically Signed By: Glendia Shows, DO [facility] 09/12/2024 23:12    Findings were discussed with the patient's nurse Kristen on 09/12/2024 at 11:12 PM     Echo (TTE) complete (PRN contrast/bubble/strain/3D)  Result Date: 09/12/2024    Image quality is poor, patient screaming in pain during exam.   Left Ventricle: Normal left ventricular systolic function with a visually estimated EF of 55 - 60%. Left ventricle is smaller than normal. Normal wall thickness.  LVIDd is 3.1 cm. Normal wall motion.   Left Atrium: Left atrium is smaller than normal.   Right Ventricle: Normal systolic function.   Aortic Valve: Individual aortic valve leaflets not well visualized.   Pericardium: No pericardial effusion.     Cardiac procedure  Result Date: 09/12/2024  Indication left lower extremity acute  limb ischemia Procedure performed: Abdominal aortogram Renal angiogram Bilateral lower extremities peripheral angiogram  Conscious sedation: IV Versed and IV fentanyl  were given, please refer to log for dosing EBL:  50 cc Description of procedure: After informed consent was obtained the patient was brought to cardiac Cath Lab in a fasting state. Bilateral groins were draped prepped and sterile and usual fashion. Right common femoral artery was accessed via ultrasound with 6 French sheath. On ultrasound the right common femoral artery was noted to be patent, needle was visualized accessing the artery. USG images were saved for medical records This was followed by insertion of pigtail catheter and J-wire. Catheter was then placed in abdominal aorta Distal aortogram was performed which revealed patent distal aorta bifurcating into left and right common iliac artery.  Renal arteries bilateral were noted be widely patent Anatomy: Left side: Left common iliac artery has 60 to 70% stenosis, left common  femoral artery 20 to 80% stenosis, left SFA mid segment 80% stenosis, possible vasospasm, popliteal arteries patent Left anterior tibial artery is occluded proximally Left posterior tibial artery is occluded mid segment No flow noted to the foot Limited angiogram on the right side Right common iliac external and femoral artery are noted to be patent Right patent proximal SFA and profunda Peripheral intervention After reviewing images, It was decided to intervene Command wire was then introduced into the anterior tibial artery Penumbra lightening bolt 6 XT X system was used in the anterior deep, no aspirate was obtained This was followed by the use of penumbra in the posterior tibial artery as well however no aspiration could be obtained. Followed by introduction of tPA catheter into the mid SFA Patient will be heparinized alongside tPA infusion at 0.5 mg/h for next 18 to 24 hours Will obtain a follow-up angiogram in the morning Dr Bertell ++++++++++++++++++++++++++ Moderate (conscious) sedation was administered under  the direct supervision of the performing physician. The patient received intravenous midazolam (Versed) and fentanyl  for sedation and analgesia. Continuous monitoring of heart rate, blood pressure, oxygen saturation, respiratory rate, and level of consciousness was maintained throughout the procedure by qualified personnel under the physicians supervision. Total sedation time: 60 minutes. The patient tolerated the procedure well without immediate complications.     Invasive vascular procedure  Result Date: 09/12/2024  Indication left lower extremity acute  limb ischemia Procedure performed: Abdominal aortogram Renal angiogram Bilateral lower extremities peripheral angiogram  Conscious sedation: IV Versed and IV fentanyl  were given, please refer to log for dosing EBL:  50 cc Description of procedure: After informed consent was obtained the patient was brought to cardiac Cath Lab in a fasting state.  Bilateral groins were draped prepped and sterile and usual fashion. Right common femoral artery was accessed via ultrasound with 6 French sheath. On ultrasound the right common femoral artery was noted to be patent, needle was visualized accessing the artery. USG images were saved for medical records This was followed by insertion of pigtail catheter and J-wire. Catheter was then placed in abdominal aorta Distal aortogram was performed which revealed patent distal aorta bifurcating into left and right common iliac artery.  Renal arteries bilateral were noted be widely patent Anatomy: Left side: Left common iliac artery has 60 to 70% stenosis, left common femoral artery 20 to 80% stenosis, left SFA mid segment 80% stenosis, possible vasospasm, popliteal arteries patent Left anterior tibial artery is occluded proximally Left posterior tibial artery is occluded mid segment No flow noted to the foot Limited angiogram on the right side Right common iliac external and femoral artery are noted to be patent Right patent proximal SFA and profunda Peripheral intervention After reviewing images, It was decided to intervene Command wire was then introduced into the anterior tibial artery Penumbra lightening bolt 6 XT X system was used in the anterior deep, no aspirate was obtained This was followed by the use of penumbra in the posterior tibial artery as well however no aspiration could be obtained. Followed by introduction of tPA catheter into the mid SFA Patient will be heparinized alongside tPA infusion at 0.5 mg/h for next 18 to 24 hours Will obtain a follow-up angiogram in the morning Dr Bertell ++++++++++++++++++++++++++ Moderate (conscious) sedation was administered under the direct supervision of the performing physician. The patient received intravenous midazolam (Versed) and fentanyl  for sedation and analgesia. Continuous monitoring of heart rate, blood pressure, oxygen saturation, respiratory rate, and level of  consciousness was maintained throughout the procedure by qualified personnel under the physicians supervision. Total sedation time: 60 minutes. The patient tolerated the procedure well without immediate complications.     XR CHEST PORTABLE  Result Date: 09/12/2024  PROCEDURE: XR CHEST PORTABLE DATE OF EXAM:  09/12/2024 9:53 DEMOGRAPHICS: 63 years old Male INDICATION: Altered Mental Status COMPARISON: None FINDINGS:  Hyperinflation. No pleural effusion, pneumothorax, or focal consolidation. Cardiac size is within normal limits. No acute osseous abnormality. IMPRESSION:  Hyperinflation without acute airspace disease  Dictated and Electronically Signed By: Jorene Plough, MD [facility] 09/12/2024 9:24            Discussion with Other Professionals : Admitting Team   and Consultant GI    Records Reviewed : Other as noted above    Chronic conditions affecting care: As noted above    Disposition Considerations (tests considered but not done, Shared Decision Making, Patient Expectation of Test or Treatment): As noted above  Patient was given the following medications:  Medications   0.9 % sodium chloride  infusion (has no administration in time range)   magnesium  sulfate 1000 mg in dextrose  5% 100 mL IVPB (has no administration in time range)   pantoprazole  (PROTONIX ) injection 40 mg (has no administration in time range)   pantoprazole  (PROTONIX ) injection 80 mg (80 mg IntraVENous Given 10/08/24 1314)         Is this patient to be included in the SEP-1 Core Measure due to severe sepsis or septic shock?   No   Exclusion criteria - the patient is NOT to be included for SEP-1 Core Measure due to:  Infection is not suspected    Disposition: Hospitalized     I am the primary physician of record    Clinical Impression:  1. Gastrointestinal hemorrhage, unspecified gastrointestinal hemorrhage type    2. Acute blood loss anemia    3. AKI (acute kidney injury)    4. Chronic anticoagulation      Disposition referral  (if applicable):  No follow-up provider specified.  Disposition medications (if applicable):  New Prescriptions    No medications on file     ED Provider Disposition Time  DISPOSITION Admitted 10/08/2024 01:59:22 PM           I am the primary physician of record    To the best of my ability, clear discharge and followup instructions, including strict instructions to return to the ER immediately for any new or worsening symptoms or any other concerns were discussed - furthermore, examples of issues that would warrant immediate return to the ER for re-evaluation of their condition were discussed in detail, as is my usual and customary practice. I ensured that, to the best of my ability, all questions were answered prior to discharge, and my patient (and/or their family member and/or caregiver/guardian) has demonstrated to me sufficient capacity to understand these instructions. In addition, permission to discuss diagnosis and all instructions were obtained by patient (if applicable) prior to any discussions.  Finally, all prescribed medications (if any) were discussed with patient as well as interactions and possible side effects    Comment: Please note this report has been produced using speech recognition software and may contain errors related to that system including errors in grammar, punctuation, and spelling, as well as words and phrases that may be inappropriate. If there are any questions or concerns please feel free to contact the dictating provider for clarification.        Sharl Aran, DO  10/08/24 1408    "

## 2024-10-08 NOTE — Plan of Care (Signed)
"    Problem: Chronic Conditions and Co-morbidities  Goal: Patient's chronic conditions and co-morbidity symptoms are monitored and maintained or improved  10/08/2024 2027 by Achilles Norleen Lenis, RN  Outcome: Progressing  10/08/2024 1624 by Roxan Maxwell, RN  Outcome: Progressing  10/08/2024 1504 by Roxan Maxwell, RN  Outcome: Progressing     Problem: Discharge Planning  Goal: Discharge to home or other facility with appropriate resources  10/08/2024 2027 by Achilles Norleen Lenis, RN  Outcome: Progressing  10/08/2024 1624 by Roxan Maxwell, RN  Outcome: Progressing  10/08/2024 1504 by Roxan Maxwell, RN  Outcome: Progressing     Problem: Pain  Goal: Verbalizes/displays adequate comfort level or baseline comfort level  10/08/2024 2027 by Achilles Norleen Lenis, RN  Outcome: Progressing  10/08/2024 1624 by Roxan Maxwell, RN  Outcome: Progressing  10/08/2024 1504 by Roxan Maxwell, RN  Outcome: Progressing     Problem: Skin/Tissue Integrity  Goal: Skin integrity remains intact  Description: 1.  Monitor for areas of redness and/or skin breakdown  2.  Assess vascular access sites hourly  3.  Every 4-6 hours minimum:  Change oxygen saturation probe site  4.  Every 4-6 hours:  If on nasal continuous positive airway pressure, respiratory therapy assess nares and determine need for appliance change or resting period  10/08/2024 2027 by Achilles Norleen Lenis, RN  Outcome: Progressing  10/08/2024 1624 by Roxan Maxwell, RN  Outcome: Progressing  Flowsheets (Taken 10/08/2024 1623)  Skin Integrity Remains Intact: Monitor for areas of redness and/or skin breakdown     Problem: Safety - Adult  Goal: Free from fall injury  10/08/2024 2027 by Achilles Norleen Lenis, RN  Outcome: Progressing  10/08/2024 1624 by Roxan Maxwell, RN  Outcome: Progressing     "

## 2024-10-08 NOTE — Plan of Care (Signed)
"    Problem: Chronic Conditions and Co-morbidities  Goal: Patient's chronic conditions and co-morbidity symptoms are monitored and maintained or improved  Outcome: Progressing     Problem: Discharge Planning  Goal: Discharge to home or other facility with appropriate resources  Outcome: Progressing     Problem: Pain  Goal: Verbalizes/displays adequate comfort level or baseline comfort level  Outcome: Progressing     "

## 2024-10-08 NOTE — ED Notes (Signed)
"  ED TO INPATIENT SBAR HANDOFF    Patient Name: Joe Avery   DOB:  12/25/1960  63 y.o.   Preferred Name    Family/Caregiver Present no   Restraints no   C-SSRS: Risk of Suicide: No Risk  Sitter no   Sepsis Risk Score      PLEASE NOTE--Encounter / Re-Admission Within 30 Days  This patient has had another encounter or admission within the last 30 days.      Readmission Risk Score: 16.3      Situation  Chief Complaint   Patient presents with    Rectal Bleeding     Brief Description of Patient's Condition: presents to the emergency department with concerns for melanotic stool.  Patient currently resides in [facility] [facility] status post complicated hospitalization as discussed below.  When the nursing staff was changing his diaper they noticed that he had dark black tarry stools and therefore sent patient to the emergency department for further evaluation.  Patient himself states he feels fine.  He declines any lightheadedness dizziness.  He declines any abdominal pain nausea or vomiting.  States he is not eating as much is normal because the food that they are feeding him does not very good but he is still hungry.  Declines any associated chest pain shortness of breath or difficulty breathing.  Has been compliant with his medications including Eliquis.  Declines vomiting any blood.  Does not drink alcohol.   Mental Status: oriented  Arrived from: nursing home    Imaging:   No orders to display     Abnormal labs: Abnormal Labs Reviewed - No abnormal labs to display     Background  History:   Past Medical History:   Diagnosis Date    Hypertension     Type 2 diabetes mellitus without complication, without long-term current use of insulin  (HCC) 08/11/2023       Assessment    Vitals: MEWS Score: 4  Level of Consciousness: Alert (0)   Vitals:    10/08/24 1221   BP: (!) 95/58   Pulse: (!) 111   Resp: 16   Temp: 98.2 F (36.8 C)   TempSrc: Oral   SpO2: 96%   Weight: 50.8 kg (112 lb)   Height: 1.753 m (5' 9)       PO Status:  Nothing by Mouth    O2 Flow Rate: O2 Device: None (Room air)      Cardiac Rhythm: st    Last documented pain medication administered: n/a    NIH Score: NIH       Active LDA's:   Peripheral IV 10/08/24 Left Antecubital (Active)   Site Assessment Clean, dry & intact 10/08/24 1304   Line Status Blood return noted;Flushed 10/08/24 1304   Phlebitis Assessment No symptoms 10/08/24 1304   Infiltration Assessment 0 10/08/24 1304   Dressing Status New dressing applied;Clean, dry & intact 10/08/24 1304   Dressing Type Transparent 10/08/24 1304       Pertinent or High Risk Medications/Drips: no   If Yes, please provide details:       Blood Product Administration: no  If Yes, please provide details:     Recommendation    Incomplete orders   Additional Comments:    If any further questions, please call Sending RN at [facility]    Electronically signed by: Electronically signed by Zebedee Reese, RN on 10/08/2024 at 1:06 PM   "

## 2024-10-08 NOTE — "Consent " (Signed)
"  Informed Consent for Blood Component Transfusion Note    I have discussed with the patient the rationale for blood component transfusion; its benefits in treating or preventing fatigue, organ damage, or death; and its risk which includes mild transfusion reactions, rare risk of blood borne infection, or more serious but rare reactions. I have discussed the alternatives to transfusion, including the risk and consequences of not receiving transfusion. The patient had an opportunity to ask questions and had agreed to proceed with transfusion of blood components.    Electronically signed by Bettyann Gosling, DO on 10/08/24 at 1:18 PM EST  "

## 2024-10-08 NOTE — Consults (Signed)
 "               [facility]           [address] [place], [place] [address]                              CONSULTATION      PATIENT NAME: Joe Avery, Joe Avery           DOB: 1961-03-07  MED REC NO: [id number]                      ROOM: ED27  ACCOUNT NO: [id number]                       ADMIT DATE: 10/08/2024  PROVIDER: Hilbert Herald, MD      CONSULT DATE: 10/08/2024    CHIEF COMPLAINT:  History of severe anemia with rectal bleeding and etiology to be determined.    HISTORY OF PRESENT ILLNESS:  The patient is a 63 year old white male, resident of [facility] with past medical history significant for hypertension, diabetes mellitus, and also peripheral vascular disease, status post thrombectomy recently, who was transferred to [facility] and then subsequently went to [facility].  The patient presented today to the emergency room with generalized weakness and also dark-colored blood clots per rectum.  This patient in the emergency room was evaluated and had a chem profile drawn, which is remarkable for BUN of 46, creatinine 4.1, and LFTs are within normal limits, and the CBC showed a WBC count of 8.5, hemoglobin is 5.2, and platelet count of 364,000.  The patient is being transfused with packed RBC and was given IV Protonix  and being admitted for further workup and management.  The patient is on Eliquis as well.  The patient has seen Dr. Cinderella Keel, and according to the patient, he has not had an EGD done, but did have a colonoscopy on February 23, 2024 and the exam was unremarkable apart from a 5 mm polyp noted in the ascending colon which was tubular adenoma.  The patient is hemodynamically stable at present.    REVIEW OF SYSTEMS:  CENTRAL NERVOUS SYSTEM:  The patient denies headache or focal sensorimotor symptoms.  CARDIOVASCULAR SYSTEM:  No history of chest pain, shortness of breath, or leg swelling.  GENITOURINARY SYSTEM:  No history of dysuria, pyuria, or  hematuria.  MUSCULOSKELETAL SYSTEM:  The patient complains of generalized weakness.  RESPIRATORY SYSTEM:  No history of cough, hemoptysis, fever, or chills.    PAST MEDICAL HISTORY:  Significant for history of hypertension, diabetes mellitus, peripheral vascular disease, status post recent thrombectomy, on Eliquis.    FAMILY HISTORY:  Noncontributory.    MEDICATIONS:  Please refer to chart (the patient is on Eliquis).    SOCIOECONOMIC HISTORY:  The patient is a current everyday smoker.  There is no history of EtOH abuse or recreational drug use.    PAST SURGICAL HISTORY:  The patient has had thrombectomy done with thrombolysis for clots in the lower extremities.    ALLERGIES:  NO KNOWN DRUG ALLERGIES.      PHYSICAL EXAMINATION:  GENERAL:  Shows a 63 year old white male of thin build and poor nutritional status, who is lying flat in bed, no acute distress.  He is awake, alert, and oriented and pleasant to talk with.  VITAL SIGNS:  Stable.  HEENT:  Shows skull to be atraumatic.  Conjunctivae pale.  NECK:  Supple.  CHEST:  Clear.  HEART:  S1, S2 normal.  ABDOMEN:  Soft, nontender, nondistended.  Liver and spleen are not palpable.  RECTAL:  Deferred.  CNS:  Shows the patient to be awake, alert, and oriented.  There are no focal sensorimotor signs.  MUSCULOSKELETAL SYSTEM:  Shows evidence of degenerative joint disease changes.    LABORATORY DATA:  As above mentioned.    IMPRESSION:  63 year old white male with history of peripheral vascular disease, status post recent thrombectomy and thrombolysis, on Eliquis, presents with generalized weakness and dark-colored blood per rectum, rule out gastrointestinal bleeding, source upper versus lower gastrointestinal tract.    RECOMMENDATIONS:    1. Agree with present management to resuscitate with IV fluids.  2. We will continue the patient on Protonix  40 mg IV q.12h.  3. Monitor the patient's serial H and H and transfuse on a p.r.n. basis to keep hemoglobin above 7 gram  percent.  4. If the patient continues to bleed, we will need a CTA/bleeding scan also to determine the site of bleeding.  5. The patient will need an EGD as a part of workup of GI bleeding.  If negative, colonoscopy and small bowel evaluation per Dr. Madie.  6. The case and plan have been discussed in detail with the patient, his half brother and father, who were present at the bedside in the emergency room.  7. The case and plan were also discussed with the ER provider.  8. Dr. Madie to resume care of the patient from October 10, 2024.  9. We will hold Eliquis for now.        HILBERT HERALD, MD    D:  10/08/2024 14:07:31     T:  10/08/2024 14:35:36     AR/AQS  Job #:  [id number]     Doc#:  [id number]     "

## 2024-10-08 NOTE — Progress Notes (Signed)
"  This nurse checked bladder scan on pt and pt was found to have in his bladder. Hospitalist was notified. Per note from 11/4 on previous admission pt required cytoscopy for foley placement. Hospitalist consulted urology and have advised to follow urology recs regarding bladderscan. Currently waiting on response from urology.   "

## 2024-10-08 NOTE — H&P (Signed)
 "   V2.0  History and Physical      Name:  Joe Avery DOB/Age/Sex: 12/20/1960  (63 y.o. male)   MRN & CSN:  [id number] & [id number] Encounter Date/Time: 10/08/2024 3:36 PM EST   Location:  3127/3127-A PCP: Fernand Ruffing, MD       Hospital Day: 1    Assessment and Plan:   Joe Avery is a 63 y.o. male with a pmh of AKI/ATN, urinary retention, s/p left AKA, type 2 diabetes mellitus, who presents with Anemia due to GI blood loss    Hospital Problems           Last Modified POA    * (Principal) Anemia due to GI blood loss 10/08/2024 Yes       Anemia  Melena  -Presenting complaint-melanotic stool noted in the facility  - Hemoglobin was found to be 5.2 g/dL in the ED, patient pale and lethargic  - Patient is on Eliquis-hold Eliquis for now  - 2 unit PRBC to be transfused  - GI team consulted  - H&H every 6 hours for now  - Iron panel to be checked    AKI on hemodialysis  Anasarca  -Recent admission was complicated by AKI on ATN in the setting of rhabdomyolysis and shock, needed to be started on hemodialysis, tunneled dialysis catheter was placed on 09/27/2024, patient continued to remain oliguric and hemodialysis was continued.  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  S/p left AKA  -Patient was initially admitted to [facility] on 09/12/2024 with severe left lower extremity pain, found to have critical limb ischemia, started on heparin  drip, emergent angiogram showed complete occlusion of left SFA, catheter directed thrombolysis with tPA, followed  by repeat angiogram with successful mechanical thrombectomy, unfortunately developed compartment syndrome with myonecrosis, nonpalpable and nondopplerable pulses with cold and mottled left leg, transferred to [facility] for vascular surgery evaluation and possible intervention.  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/12  - Vascular surgery team recommended full anticoagulation patient was transition from heparin  drip to Eliquis on 09/28/2024    Type 2 diabetes mellitus  - Patient was discharged on sliding scale as his glucose was well-controlled due to poor oral intake  - Continue on low-dose sliding scale    Disposition:     Diet ADULT DIET; Clear Liquid  Diet NPO Exceptions are: Ice Chips   DVT Prophylaxis []  Lovenox , []   Heparin , []  SCDs, []  Ambulation,  []  Eliquis, []  Xarelto  []  Coumadin   Peptic ulcer prophylaxis -   Code Status Full Code   Disposition From / Current living situation : Nursing home  Expected Disposition: Same  Estimated Date of Discharge: 3 to 4 days  Patient requires continued admission due to anemia due to GI bleed   Surrogate Decision Maker/ POA -     Goals status was discussed in detail with patient's family-father and half-brother.  Verbalized understanding of different options of  CODE STATUS.  Patient opted for full code.    History from:     patient    History of Present Illness:     Chief Complaint: Anemia due to GI blood loss  Joe Avery is a 63 y.o. male with pmh of AKI/ATN, urinary retention, s/p left AKA, type 2 diabetes mellitus, who presents with melanotic stool from nursing home and was found to be significantly anemic with hemoglobin down to 5.2 g/dL.  Patient appears pale and lethargic.  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  He was on full dose  anticoagulation due to thromboembolic etiology.  And has been on Eliquis.        Personally reviewed Lab Studies and Imaging     Discussed management of the case with ED team who recommended admission for further evaluation and management    Drugs that require monitoring for toxicity include insulin  and the method of monitoring was POCT glucose checks       Review of Systems: Need 10 Elements   Review of Systems    Negative unless mentioned above    Objective:   No intake or output data in the 24 hours ending 10/08/24 1536   Vitals:   Vitals:    10/08/24 1345 10/08/24 1446 10/08/24 1525 10/08/24 1526   BP:  97/67  107/72   Pulse: (!) 104 (!) 113  (!) 112   Resp:  19  18   Temp:  97.6 F (36.4 C) 97.7 F (36.5 C) 97.7 F (36.5 C)   TempSrc:  Oral Oral    SpO2:  98%  100%   Weight:       Height:           Medications Prior to Admission     Prior to Admission medications   Medication Sig Start Date End Date Taking? Authorizing Provider   aspirin  81 MG chewable tablet Take 1 tablet by mouth daily  Patient not taking: Reported on 05/18/2024 12/14/23   D'Entremont, Mabel DASEN, MD   atorvastatin  (LIPITOR ) 40 MG tablet Take 1 tablet by mouth nightly 12/13/23   D'Entremont, Mabel DASEN, MD   metFORMIN (GLUCOPHAGE) 500 MG tablet Take 1 tablet by mouth daily (with breakfast) 08/12/23   [provider]   raNITIdine HCl (RANITIDINE 150 MAX STRENGTH PO) Take by mouth as needed    [provider]   Aspirin -Acetaminophen -Caffeine (EXCEDRIN PO) Take by mouth as needed    [provider]   gabapentin  (NEURONTIN ) 100 MG capsule Take 1 capsule by mouth in the morning and at bedtime. 08/06/23 08/05/24  Fernande Alfonso Sahara, APRN - CNP   amLODIPine  (NORVASC ) 10 MG tablet Take 1 tablet by mouth daily 02/02/23 05/18/24  [provider]   lisinopril  (PRINIVIL ;ZESTRIL ) 40 MG tablet Take 1 tablet by mouth daily 02/02/23 05/18/24  [provider]       Physical Exam: Need 8 Elements   Physical Exam     General:  Appears pale, lethargic  Eyes: EOMI  ENT: neck supple  Cardiovascular: Regular rate.  Bilateral lower extremity pitting edema  Respiratory: Clear to auscultation  Gastrointestinal: Soft, non tender  Genitourinary: no suprapubic tenderness  Musculoskeletal: No edema, surgical site appears clean  Skin: warm, dry  Neuro: Alert.  Psych: Mood appropriate.       Past Medical History:   PMHx   Past Medical History:   Diagnosis Date    Anemia due  to GI blood loss 10/08/2024    Hypertension     Type 2 diabetes mellitus without complication, without long-term current use of insulin  (HCC) 08/11/2023     PSHX:  has a past surgical history that includes Colonoscopy (N/A, 02/23/2024); Cardiac procedure (N/A, 09/12/2024); invasive vascular (N/A, 09/12/2024); and invasive vascular (N/A, 09/13/2024).  Allergies: No Known Allergies  Fam HX:  family history includes Diabetes in his father.  Soc HX:   Social History     Socioeconomic History    Marital status: Single   Tobacco Use    Smoking status: Every Day     Current packs/day: 0.10     Average packs/day: 0.5 packs/day for 44.9 years (21.7 ttl pk-yrs)     Types: Cigarettes     Start date: 57    Smokeless tobacco: Never    Tobacco comments:     Down to 3 per day   Vaping Use    Vaping status: Never Used   Substance and Sexual Activity    Alcohol use: Never    Drug use: Not Currently     Social Drivers of Health     Food Insecurity: No Food Insecurity (10/08/2024)    Hunger Vital Sign     Worried About Running Out of Food in the Last Year: Never true     Ran Out of Food in the Last Year: Never true   Transportation Needs: No Transportation Needs (10/08/2024)    PRAPARE - Transportation     Lack of Transportation (Medical): No     Lack of Transportation (Non-Medical): No   Intimate Partner Violence: Not At Risk (09/13/2024)    Received from [employer], Afraid, Rape, and Kick questionnaire     Within the last year, have you been afraid of your partner or ex-partner?: No      Within the last year, have you been humiliated or emotionally abused in other ways by your partner or ex-partner?: No     Within the last year, have you been kicked, hit, slapped, or otherwise physically hurt by your partner or ex-partner?: No     Within the last year, have you been raped or forced to have any kind of sexual activity by your partner or ex-partner?: No   Housing Stability: Low Risk  (10/08/2024)    Housing Stability Vital Sign     Unable to Pay for Housing in the Last Year: No     Number of Times Moved in the Last Year: 0     Homeless in the Last Year: No       Medications:   Medications:    [Held by provider] amLODIPine   10 mg Oral Daily    [Held by provider] aspirin   81 mg Oral Daily    [START ON 10/09/2024] atorvastatin   40 mg Oral Nightly    gabapentin   100 mg Oral BID    [Held by provider] lisinopril   40 mg Oral Daily    magnesium  sulfate  1,000 mg IntraVENous Once    insulin  lispro  0-4 Units SubCUTAneous 4x Daily AC & HS    pantoprazole   40 mg IntraVENous BID    sodium chloride  flush  5-40 mL IntraVENous 2 times per day      Infusions:    sodium chloride       dextrose       sodium chloride        PRN Meds: sodium chloride , , PRN  glucose, 4 tablet, PRN  dextrose   bolus, 125 mL, PRN   Or  dextrose  bolus, 250 mL, PRN  glucagon , 1 mg, PRN  dextrose , , Continuous PRN  sodium chloride  flush, 5-40 mL, PRN  sodium chloride , , PRN  ondansetron , 4 mg, Q8H PRN   Or  ondansetron , 4 mg, Q6H PRN  acetaminophen , 650 mg, Q6H PRN   Or  acetaminophen , 650 mg, Q6H PRN        Labs      CBC:   Recent Labs     10/08/24  1304   WBC 8.5   HGB 5.2*   PLT 364     BMP:    Recent Labs     10/08/24  1304   NA 135*   K 4.3   CL 99   CO2 25   BUN 46*   CREATININE 4.1*   GLUCOSE 203*     Hepatic:   Recent Labs     10/08/24  1304   AST 34   ALT 25   BILITOT 0.3   ALKPHOS 124     Lipids:   Lab Results   Component Value Date/Time    CHOL 127 12/13/2023 05:01 AM    HDL 38 12/13/2023 05:01 AM    TRIG 86 12/13/2023 05:01 AM      Hemoglobin A1C:   Lab Results   Component Value Date/Time    LABA1C 11.7 09/12/2024 09:00 AM     TSH:   Lab Results   Component Value Date/Time    TSH 0.46 09/12/2024 09:00 AM     Troponin: No results found for: TROPONINT  Lactic Acid: No results for input(s): LACTA in the last 72 hours.  BNP: No results for input(s): PROBNP in the last 72 hours.  UA:  Lab Results   Component Value Date/Time    NITRU Negative 05/05/2024 10:29 AM    COLORU Yellow 05/05/2024 10:29 AM    PHUR 6.0 05/05/2024 10:29 AM    LABCAST None seen 01/04/2024 10:05 AM    RBCUA 3-10 01/04/2024 10:05 AM    BACTERIA None seen 01/04/2024 10:05 AM    CLARITYU Clear 05/05/2024 10:29 AM    LEUKOCYTESUR Negative 05/05/2024 10:29 AM    UROBILINOGEN 0.2 05/05/2024 10:29 AM    BILIRUBINUR Negative 05/05/2024 10:29 AM    BLOODU Negative 05/05/2024 10:29 AM    GLUCOSEU Negative 05/05/2024 10:29 AM    KETUA Negative 05/05/2024 10:29 AM     Urine Cultures: No results found for: LABURIN  Blood Cultures: No results found for: BC  No results found for: BLOODCULT2  Organism: No results found for: ORG    Imaging/Diagnostics Last 24 Hours   No results found.        Electronically signed by Melda Manas, MD on 10/08/2024 at 3:36 PM      Comment: Please note this report has been produced using speech recognition software and may contain errors related to that system including errors in grammar, punctuation, and spelling, as well as words and phrases that may be inappropriate. If there are any questions or concerns please feel free to contact the dictating provider for clarification.                [facility] on 09/13/2024  "

## 2024-10-09 ENCOUNTER — Inpatient Hospital Stay: Admit: 2024-10-09 | Payer: MEDICARE | Primary: Internal Medicine

## 2024-10-09 LAB — HEMOGLOBIN AND HEMATOCRIT
Hematocrit: 16.9 % — ABNORMAL LOW (ref 42.0–52.0)
Hematocrit: 22.3 % — ABNORMAL LOW (ref 42.0–52.0)
Hemoglobin: 5.5 g/dL — CL (ref 13.5–18.0)
Hemoglobin: 7.5 g/dL — ABNORMAL LOW (ref 13.5–18.0)

## 2024-10-09 LAB — URINALYSIS
Bilirubin, Urine: NEGATIVE
Glucose, Ur: NEGATIVE mg/dL
Ketones, Urine: NEGATIVE mg/dL
Leukocyte Esterase, Urine: NEGATIVE
Nitrite, Urine: NEGATIVE
Protein, UA: 30 mg/dL — AB
Specific Gravity, UA: 1.01 (ref 1.005–1.030)
Urobilinogen, Urine: 0.2 EU/dL (ref 0.0–1.0)
pH, Urine: 8.5 — ABNORMAL HIGH (ref 5.0–8.0)

## 2024-10-09 LAB — CBC WITH AUTO DIFFERENTIAL
Basophils %: 1 % (ref 0–1)
Basophils Absolute: 0.13 k/uL
Eosinophils %: 4 % — ABNORMAL HIGH (ref 0.0–3.0)
Eosinophils Absolute: 0.43 k/uL
Hematocrit: 26.8 % — ABNORMAL LOW (ref 42.0–52.0)
Hemoglobin: 9 g/dL — ABNORMAL LOW (ref 13.5–18.0)
Immature Granulocytes %: 1 % — ABNORMAL HIGH
Immature Granulocytes Absolute: 0.1 k/uL
Lymphocytes %: 18 % — ABNORMAL LOW (ref 24–44)
Lymphocytes Absolute: 2.04 k/uL
MCH: 28.2 pg (ref 27.0–31.0)
MCHC: 33.6 g/dL (ref 32.0–36.0)
MCV: 84 fL (ref 78.0–100.0)
MPV: 11.3 fL — ABNORMAL HIGH (ref 7.5–11.1)
Monocytes %: 9 % — ABNORMAL HIGH (ref 0–5)
Monocytes Absolute: 1 k/uL
Neutrophils %: 67 % — ABNORMAL HIGH (ref 36–66)
Neutrophils Absolute: 7.38 k/uL
Platelets: 249 k/uL (ref 140–440)
RBC: 3.19 m/uL — ABNORMAL LOW (ref 4.60–6.20)
RDW: 16.1 % — ABNORMAL HIGH (ref 11.7–14.9)
WBC: 11.1 k/uL — ABNORMAL HIGH (ref 4.0–10.5)

## 2024-10-09 LAB — MICROSCOPIC URINALYSIS
Casts UA: 5 /LPF — ABNORMAL HIGH
Epithelial Cells, UA: 1 /HPF
RBC, UA: 190 /HPF — ABNORMAL HIGH (ref 0–2)
WBC, UA: 6 /HPF — ABNORMAL HIGH (ref 0–5)

## 2024-10-09 LAB — POCT GLUCOSE
POC Glucose: 125 mg/dL — ABNORMAL HIGH (ref 74–99)
POC Glucose: 141 mg/dL — ABNORMAL HIGH (ref 74–99)
POC Glucose: 163 mg/dL — ABNORMAL HIGH (ref 74–99)
POC Glucose: 224 mg/dL — ABNORMAL HIGH (ref 74–99)

## 2024-10-09 LAB — HEPATIC FUNCTION PANEL
ALT: 19 U/L (ref 10–40)
AST: 28 U/L (ref 15–37)
Albumin/Globulin Ratio: 1.3 (ref 1.1–2.2)
Albumin: 2.2 g/dL — ABNORMAL LOW (ref 3.4–5.0)
Alkaline Phosphatase: 89 U/L (ref 40–129)
Bilirubin, Direct: <0.2 mg/dL (ref 0.0–0.3)
Total Bilirubin: 0.4 mg/dL (ref 0.0–1.0)
Total Protein: 3.9 g/dL — ABNORMAL LOW (ref 6.4–8.2)

## 2024-10-09 LAB — IRON AND TIBC
Iron % Saturation: 47 % (ref 15–50)
Iron: 52 ug/dL — ABNORMAL LOW (ref 59–158)
TIBC: 109 ug/dL — ABNORMAL LOW (ref 260–445)
UIBC: 57.4 ug/dL — ABNORMAL LOW (ref 110–370)

## 2024-10-09 LAB — PROTEIN / CREATININE RATIO, URINE
Creatinine, Ur: 42.1 mg/dL (ref 39.0–259.0)
Total Protein, Urine: 43 mg/dL — ABNORMAL HIGH (ref <12)
Urine Total Protein Creatinine Ratio: 1.03 — ABNORMAL HIGH (ref 0.0–0.2)

## 2024-10-09 LAB — PROTIME-INR
INR: 1.5
INR: 1.3
Protime: 18.9 s — ABNORMAL HIGH (ref 11.7–14.5)
Protime: 16.4 s — ABNORMAL HIGH (ref 11.7–14.5)

## 2024-10-09 LAB — BASIC METABOLIC PANEL
Anion Gap: 7 mmol/L — ABNORMAL LOW (ref 9–17)
BUN: 52 mg/dL — ABNORMAL HIGH (ref 7–20)
CO2: 28 mmol/L (ref 21–32)
Calcium: 7.4 mg/dL — ABNORMAL LOW (ref 8.3–10.6)
Chloride: 101 mmol/L (ref 99–110)
Creatinine: 4.5 mg/dL — ABNORMAL HIGH (ref 0.8–1.3)
Est, Glom Filt Rate: 13 mL/min/1.73m2 — ABNORMAL LOW (ref >60)
Glucose: 156 mg/dL — ABNORMAL HIGH (ref 74–99)
Potassium: 4.9 mmol/L (ref 3.5–5.1)
Sodium: 136 mmol/L (ref 136–145)

## 2024-10-09 LAB — MAGNESIUM: Magnesium: 1.8 mg/dL (ref 1.8–2.4)

## 2024-10-09 LAB — LIPASE: Lipase: 11 U/L — ABNORMAL LOW (ref 13–60)

## 2024-10-09 LAB — PHOSPHORUS: Phosphorus: 4.2 mg/dL (ref 2.5–4.9)

## 2024-10-09 LAB — SODIUM, URINE, RANDOM: Sodium, Ur: 94 mmol/L (ref 40–220)

## 2024-10-09 MED ORDER — SODIUM CHLORIDE 0.9 % IV SOLN
0.9 | INTRAVENOUS | Status: DC | PRN
Start: 2024-10-09 — End: 2024-10-13

## 2024-10-09 MED FILL — ACETAMINOPHEN 325 MG PO TABS: 325 mg | ORAL | Qty: 2 | Fill #0

## 2024-10-09 MED FILL — PANTOPRAZOLE SODIUM 40 MG IV SOLR: 40 mg | INTRAVENOUS | Qty: 40 | Fill #0

## 2024-10-09 MED FILL — GABAPENTIN 100 MG PO CAPS: 100 mg | ORAL | Qty: 1 | Fill #0

## 2024-10-09 MED FILL — INSULIN LISPRO 100 UNIT/ML IJ SOLN: 100 [IU]/mL | INTRAMUSCULAR | Qty: 2 | Fill #0

## 2024-10-09 NOTE — Plan of Care (Signed)
"    Problem: Chronic Conditions and Co-morbidities  Goal: Patient's chronic conditions and co-morbidity symptoms are monitored and maintained or improved  Outcome: Progressing     Problem: Discharge Planning  Goal: Discharge to home or other facility with appropriate resources  Outcome: Progressing     Problem: Pain  Goal: Verbalizes/displays adequate comfort level or baseline comfort level  Outcome: Progressing     Problem: Skin/Tissue Integrity  Goal: Skin integrity remains intact  Description: 1.  Monitor for areas of redness and/or skin breakdown  2.  Assess vascular access sites hourly  3.  Every 4-6 hours minimum:  Change oxygen saturation probe site  4.  Every 4-6 hours:  If on nasal continuous positive airway pressure, respiratory therapy assess nares and determine need for appliance change or resting period  Outcome: Progressing  Flowsheets (Taken 10/09/2024 1101)  Skin Integrity Remains Intact: Monitor for areas of redness and/or skin breakdown     Problem: Safety - Adult  Goal: Free from fall injury  Outcome: Progressing     "

## 2024-10-09 NOTE — Progress Notes (Signed)
"  DOING FAIR HAD SOME BM WITH DARK COLORED STOOLS NO ABD PAIN   VITALS STABLE   LABS NOTED HB 9.0  WILL CPM START CLD  DR EDHI TO RESUME CARE FROM TOMORROW AM D/W RN  "

## 2024-10-09 NOTE — Progress Notes (Signed)
 "    V2.0  [facility] Hospitalist Progress Note      Name:  Joe Avery DOB/Age/Sex: 1961-05-30  (63 y.o. male)   MRN & CSN:  [id number] & [id number] Encounter Date/Time: 10/09/2024 1:19 PM EST    Location:  3127/3127-A PCP: Fernand Ruffing, MD       Hospital Day: 2    Assessment and Plan:   Joe Avery is a 63 y.o. male with pmh of  who presents with Anemia due to GI blood loss      Plan:    Anemia  Melena  -Presenting complaint-melanotic stool noted in the facility  - Hemoglobin was found to be 5.2 g/dL in the ED, patient pale and lethargic  - Patient is on Eliquis-hold Eliquis for now  - 2 unit PRBC to be transfused  - GI team consulted  - Hemoglobin stable today.     AKI on hemodialysis  Anasarca  -Recent admission was complicated by AKI on ATN in the setting of rhabdomyolysis and shock, needed to be started on hemodialysis, tunneled dialysis catheter was placed on 09/27/2024, patient continued to remain oliguric and hemodialysis was continued.  - Consult nephrology  - Decision for dialysis per nephrology team  - With patient making urine, will likely benefit from diuretics     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  S/p left AKA  -Patient was initially admitted to [facility] on 09/12/[facility]24 with severe left lower extremity pain, found to have critical limb ischemia, started on heparin  drip, emergent angiogram showed complete occlusion of left SFA, catheter directed thrombolysis with tPA, followed by repeat angiogram with successful mechanical thrombectomy, unfortunately developed compartment syndrome with myonecrosis, nonpalpable and nondopplerable pulses with cold and mottled left leg, transferred to [facility] for vascular  surgery evaluation and possible intervention.  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/12  - Vascular surgery team recommended full anticoagulation patient was transition from heparin  drip to Eliquis on 09/28/2024     Type 2 diabetes mellitus  - Patient was discharged on sliding scale as his glucose was well-controlled due to poor oral intake  - Continue on low-dose sliding scale          Diet ADULT DIET; Clear Liquid  Diet NPO   DVT Prophylaxis []  Lovenox , []   Heparin , []  SCDs, []  Ambulation,  []  Eliquis, []  Xarelto  []  Coumadin   Code Status Full Code   Disposition From:   Expected Disposition:   Estimated Date of Discharge:   Patient requires continued admission due to    Surrogate Decision Maker/ POA      Personally reviewed Lab Studies and Imaging         Subjective:     Chief Complaint: Rectal Bleeding       Joe Avery is a 63 y.o. male who presents with low hemoglobin.    Seen and examined in the morning.  Appears quite lethargic.  Pale looking, ill looking .  Hemoglobin 9G/DL this a.m.         Review of Systems:    Review of Systems  Negative if not mentioned  above      Objective:     Intake/Output Summary (Last 24 hours) at 10/09/2024 1319  Last data filed at 10/09/2024 0843  Gross per 24 hour   Intake 1616.79 ml   Output 1200 ml   Net 416.79 ml        Vitals:   Vitals:    10/09/24 1300   BP: 102/79   Pulse: (!) 109   Resp: 20   Temp:    SpO2: 96%       Physical Exam:     General: NAD  Eyes: EOMI  ENT: neck supple  Cardiovascular: Regular rate.  Respiratory: Clear to auscultation  Gastrointestinal: Soft, non tender  Genitourinary: no suprapubic tenderness  Musculoskeletal: Left AKA, sutures intact, wound dry  Skin: warm, dry  Neuro: Alert.  Psych: Mood appropriate.     Medications:   Medications:    [Held by provider] amLODIPine   10 mg Oral Daily    [Held by provider] aspirin   81 mg  Oral Daily    [Held by provider] atorvastatin   40 mg Oral Nightly    [Held by provider] gabapentin   100 mg Oral BID    [Held by provider] lisinopril   40 mg Oral Daily    insulin  lispro  0-4 Units SubCUTAneous 4x Daily AC & HS    pantoprazole   40 mg IntraVENous BID    sodium chloride  flush  5-40 mL IntraVENous 2 times per day      Infusions:    sodium chloride       dextrose       sodium chloride       sodium chloride        PRN Meds: sodium chloride , , PRN  glucose, 4 tablet, PRN  dextrose  bolus, 125 mL, PRN   Or  dextrose  bolus, 250 mL, PRN  glucagon , 1 mg, PRN  dextrose , , Continuous PRN  sodium chloride  flush, 5-40 mL, PRN  sodium chloride , , PRN  ondansetron , 4 mg, Q8H PRN   Or  ondansetron , 4 mg, Q6H PRN  acetaminophen , 650 mg, Q6H PRN   Or  acetaminophen , 650 mg, Q6H PRN  sodium chloride , , PRN        Labs      Recent Results (from the past 24 hours)   POCT Glucose    Collection Time: 10/08/24  4:14 PM   Result Value Ref Range    POC Glucose 196 (H) 74 - 99 mg/dL   POCT Glucose    Collection Time: 10/08/24  7:42 PM   Result Value Ref Range    POC Glucose 125 (H) 74 - 99 mg/dL   Hemoglobin and Hematocrit    Collection Time: 10/08/24 10:38 PM   Result Value Ref Range    Hemoglobin 5.5 (LL) 13.5 - 18.0 g/dL    Hematocrit 83.0 (L) 42.0 - 52.0 %   Lipase    Collection Time: 10/09/24  4:35 AM   Result Value Ref Range    Lipase 11 (L) 13 - 60 U/L   Basic Metabolic Panel    Collection Time: 10/09/24  4:35 AM   Result Value Ref Range    Sodium 136 136 - 145 mmol/L    Potassium 4.9 3.5 - 5.1 mmol/L    Chloride 101 99 - 110 mmol/L    CO2 28 21 - 32 mmol/L    Anion Gap 7 (L) 9 - 17 mmol/L    Glucose 156 (H) 74 - 99 mg/dL    BUN 52 (H) 7 -  20 mg/dL    Creatinine 4.5 (H) 0.8 - 1.3 mg/dL    Est, Glom Filt Rate 13 (L) >60 mL/min/1.56m2    Calcium  7.4 (L) 8.3 - 10.6 mg/dL   Magnesium     Collection Time: 10/09/24  4:35 AM   Result Value Ref Range    Magnesium  1.8 1.8 - 2.4 mg/dL   Phosphorus    Collection Time: 10/09/24  4:35 AM    Result Value Ref Range    Phosphorus 4.2 2.5 - 4.9 mg/dL   Hepatic Function Panel    Collection Time: 10/09/24  4:35 AM   Result Value Ref Range    Albumin 2.2 (L) 3.4 - 5.0 g/dL    Alkaline Phosphatase 89 40 - 129 U/L    ALT 19 10 - 40 U/L    AST 28 15 - 37 U/L    Total Bilirubin 0.4 0.0 - 1.0 mg/dL    Bilirubin, Direct <9.7 0.0 - 0.3 mg/dL    Bilirubin, Indirect Can not be calculated 0.0 - 0.7 mg/dL    Total Protein 3.9 (L) 6.4 - 8.2 g/dL    Albumin/Globulin Ratio 1.3 1.1 - 2.2   CBC with Auto Differential    Collection Time: 10/09/24  4:35 AM   Result Value Ref Range    WBC 11.1 (H) 4.0 - 10.5 k/uL    RBC 3.19 (L) 4.60 - 6.20 m/uL    Hemoglobin 9.0 (L) 13.5 - 18.0 g/dL    Hematocrit 73.1 (L) 42.0 - 52.0 %    MCV 84.0 78.0 - 100.0 fL    MCH 28.2 27.0 - 31.0 pg    MCHC 33.6 32.0 - 36.0 g/dL    RDW 83.8 (H) 88.2 - 14.9 %    Platelets 249 140 - 440 k/uL    MPV 11.3 (H) 7.5 - 11.1 fL    Neutrophils % 67 (H) 36 - 66 %    Lymphocytes % 18 (L) 24 - 44 %    Monocytes % 9 (H) 0 - 5 %    Eosinophils % 4 (H) 0.0 - 3.0 %    Basophils % 1 0 - 1 %    Immature Granulocytes % 1 (H) 0 %    Neutrophils Absolute 7.38 k/uL    Lymphocytes Absolute 2.04 k/uL    Monocytes Absolute 1.00 k/uL    Eosinophils Absolute 0.43 k/uL    Basophils Absolute 0.13 k/uL    Immature Granulocytes Absolute 0.10 k/uL   Iron and TIBC    Collection Time: 10/09/24  4:35 AM   Result Value Ref Range    Iron 52 (L) 59 - 158 ug/dL    TIBC 890 (L) 739 - 554 ug/dL    Iron % Saturation 47 15 - 50 %    UIBC 57.4 (L) 110 - 370 ug/dL   Protime-INR    Collection Time: 10/09/24  4:35 AM   Result Value Ref Range    Protime 18.9 (H) 11.7 - 14.5 sec    INR 1.5    POCT Glucose    Collection Time: 10/09/24  7:53 AM   Result Value Ref Range    POC Glucose 141 (H) 74 - 99 mg/dL   Urinalysis    Collection Time: 10/09/24  8:50 AM   Result Value Ref Range    Color, UA Yellow Yellow    Turbidity UA Clear Clear    Glucose, Ur NEGATIVE NEGATIVE mg/dL    Bilirubin, Urine NEGATIVE  NEGATIVE    Ketones,  Urine NEGATIVE NEGATIVE mg/dL    Specific Gravity, UA 1.010 1.005 - 1.030    Urine Hgb LARGE (A) NEGATIVE    pH, Urine 8.5 (H) 5.0 - 8.0    Protein, UA 30 (A) NEGATIVE mg/dL    Urobilinogen, Urine 0.2 0.0 - 1.0 EU/dL    Nitrite, Urine NEGATIVE NEGATIVE    Leukocyte Esterase, Urine NEGATIVE NEGATIVE   Sodium, Random Ur    Collection Time: 10/09/24  8:50 AM   Result Value Ref Range    Sodium, Ur 94 40 - 220 mmol/L   Protein / creatinine ratio, urine    Collection Time: 10/09/24  8:50 AM   Result Value Ref Range    Total Protein, Urine 43 (H) <12 mg/dL    Creatinine, Ur 57.8 39.0 - 259.0 mg/dL    Urine Total Protein Creatinine Ratio 1.03 (H) 0.0 - 0.2   Microscopic Urinalysis    Collection Time: 10/09/24  8:50 AM   Result Value Ref Range    WBC, UA 6 (H) 0 - 5 /HPF    RBC, UA 190 (H) 0 - 2 /HPF    Casts UA 5 HYALINE (H) 0 /LPF    Epithelial Cells, UA 1 /HPF    Bacteria, UA None None   POCT Glucose    Collection Time: 10/09/24 11:21 AM   Result Value Ref Range    POC Glucose 163 (H) 74 - 99 mg/dL        Imaging/Diagnostics Last 24 Hours   CT ABDOMEN PELVIS WO CONTRAST Additional Contrast? None  Result Date: 10/09/2024  PROCEDURE: CT ABDOMEN PELVIS WO CONTRAST DATE OF EXAM:  10/08/2024 21:55 DEMOGRAPHICS: 63 years old Male INDICATION: Assess for hydronephrosis COMPARISON: No existing relevant imaging study corresponding to the same anatomical region is available. TECHNIQUE: Contiguous axial slices of the abdomen and pelvis were submitted without IV administration of contrast. No oral contrast was utilized. Additional  coronal reformatted images were submitted.  DOSE OPTIMIZATION: CT radiation dose optimization techniques (automated exposure  control, and use of iterative reconstruction techniques, or adjustment of the mA and/or kV according to patient size) were used to limit patient radiation dose. FINDINGS: CT ABDOMEN: Inferior chest:  There are large bilateral pleural effusions. There is bibasilar   consolidation. The heart size is normal without pericardial effusion. Gallbladder fossa:  No focal abnormalities are seen. Biliary tree: There is no evidence for intra-or extrahepatic biliary ductal dilatation. Liver: The liver demonstrates normal appearance. No focal abnormalities are seen. Spleen: Normal size and morphology is seen. No masses are identified. Pancreas: Normal morphology without masses or inflammatory changes. Adrenals: Normal size without masses. Kidneys: There is mild bilateral hydronephrosis. Vasculature: The abdominal aorta is normal in caliber. There is mild atherosclerotic disease of the abdominal aorta. Lymphatic system: No pathologically enlarged lymph nodes are seen. Bowel: The stomach is normally distended with no focal wall abnormality. The duodenum is normal in caliber along its course. No focal abnormality is seen. The small bowel is normal caliber. There is no focal stricture or dilatation. The colon is normal in caliber. No focal abnormality is seen. The appendix is not definitively visualized. Peritoneal structures: There is diffuse mesenteric and body wall edema. There is  trace ascites.  Retroperitoneum: No focal retroperitoneal abnormality is seen. Abdominal wall:  The visualized portions of the abdominal wall are within normal  limits. CT PELVIS: Urinary bladder: There is prominent distention of the urinary bladder. There is no focal bladder wall abnormality. Soft tissues: There is  no pelvic adenopathy. The prostate is mildly enlarged. There is diffuse edema in the pelvic wall. Bones: No significant abnormalities in the bony pelvis. IMPRESSION: 1.  Mild bilateral hydronephrosis. Prominent distention of the urinary bladder. Findings could be related to urinary retention. Recommend clinical correlation and follow-up. 2.  Diffuse mesenteric and body wall edema, suggestive of anasarca. 3.  Large bilateral pleural effusions. This dictation was created with voice recognition software.   While attempts have  been made to review the dictation as it is transcribed, on occasion the spoken word can be misinterpreted by the technology leading to omissions or inappropriate words, phrases or sentences.  Dictated and Electronically Signed By: Iantha Bumpers, MD [facility] Reading Station:  [id number]  10/08/2024 22:36        US  PELVIS LIMITED  Result Date: 10/08/2024  US  PELVIS LIMITED  DATE OF SERVICE:  10/08/2024 17:57 CLINICAL INDICATION: bladder to verify retention as requested by urology team PATIENT INFORMATION: Bladder distention COMPARISON: None TECHNIQUE:  Real-time imaging of the urinary bladder FINDINGS:  Bladder contour is preserved. The bladder is distended. Estimated at 770 mL fluid. No filling defects or wall thickening. IMPRESSION: Distended urinary bladder.  Dictated and Electronically Signed By: Chyrl Deters, DO [facility] Reading Station:  [facility]  10/08/2024 20:47          Electronically signed by Russella Merck, MD on 10/09/2024 at 1:19 PM   "

## 2024-10-09 NOTE — Progress Notes (Signed)
"  Blood transfusion consent not available in the file, new consent secured, patient agreed for blood transfusion but because of weakness patient is having hard time to sign and instructed us  to put his initials. Witness by Allean Oas nurse.  "

## 2024-10-09 NOTE — Consults (Signed)
 "      Patient:   Joe Avery    Date:  10/09/24  DOB:  1961/06/05, 63 y.o.   Nephrologist: Gatha Koleen Box, MD  Provider: Fernand Ruffing, MD    Reason for Consult: Stage III acute kidney disease requiring kidney replacement therapy    Consult requested by : Dr     Author Head, MD       Chief Complaint:   Dark tarry stool    HISTORY OF PRESENT ILLNESS/Brief hospital course  AND  brief background history    Mr. Joe Avery, is an unfortunate 63-year young, who was brought to the emergency department via emergency medical services nursing staff saw dark tarry stool in the bed.  I did not see the emergency medical service note but on arrival in our emergency department, he was afebrile had tachycardia and low blood pressure.  Oxygen saturation was mid 90s while breathing ambient air.  Imaging studies which include computed tomography of abdomen and pelvis interpreted by radiologist as mild bilateral hydronephrosis, distention of the urinary bladder, diffuse mesenteric body wall edema suggestive of anasarca and large bilateral pleural effusion.    Biochemical testing showed sodium 135 with concomitant glucose of 203 mg/dL and creatinine of 4.1 mg/dL.  His albumin is extremely low at 2.5 g/dL, and had very low hemoglobin of 5.2 g/dL.  Also low magnesium  of 1.7 mg/dL.      He was placed on intravenous proton pump inhibitor, electrolyte were repleted intravenously, and he was subsequent admitted to medical floor for further evaluation and management.  Also gastroenterology evaluation not done    When I saw him this morning, he is alert awake and oriented but he does not recall a lot of stop.  Does not seem to be in distress but has significant right leg edema he of course has left about knee amputation.  I met Mr. Joe Avery last Friday during his maintenance dialysis at [facility].  Looks like he presented to the emergency department here in [place] the beginning of November, had ischemic leg thought  to be from arterial occlusion, some intervention was done and eventually transferred to [facility].  .His course was complicated by compartment syndrome requiring eventual amputation, rhabdomyolysis and acute kidney injury requiring maintenance hemodialysis .  Left internal jugular tunneled cuffed dialysis cath was placed and he was subsequently transferred to nursing home.  Prior to that he was seeing my associate Dr. Tyrone, and last encounter was in May 18, 2024.  At that time his creatinine was 1.34 mg/dL.      Creatinine trend/ Kidney data :    Serum creatinine is available since July 2017 it was 1.3 mg/dL, creatinine remained in the vicinity of 1.2 to 1.3 mg/dL up until November 7974.  Creatinine peaked around 5.5 on September 17, 2024 by me assuming that is when the dialysis initiated.    Heart/ cardiac data :       Two-dimensional transthoracic echocardiogram done on September 12, 2024      Image quality is poor, patient screaming in pain during exam.    Left Ventricle: Normal left ventricular systolic function with a visually estimated EF of 55 - 60%. Left ventricle is smaller than normal. Normal wall thickness.  LVIDd is 3.1 cm. Normal wall motion.    Left Atrium: Left atrium is smaller than normal.    Right Ventricle: Normal systolic function.    Aortic Valve: Individual aortic valve leaflets not well visualized.  Pericardium: No pericardial effusion.    Kidney imaging :         Adrenals: Normal size without masses.     Kidneys: There is mild bilateral hydronephrosis.    Urinary bladder: There is prominent distention of the urinary bladder. There is   no focal bladder wall abnormality.       Past medical history :       Stage III acute kidney disease in November 2025 requiring maintenance dialysis  Severe peripheral arterial disease with compartment syndrome necessitating left above-knee amputation  Longstanding diabetes mellitus  Presume episode of transient ischemic attack  History of high blood  pressure         Past surgical history :    Left above-knee amputation  Left intrajugular tunneled cuffed dialysis catheter         Habits :     He has been smoking since age 83 under lately smoking only 3 to 4 cigarettes a day, denied any history of alcohol use or drug use    Social history :   He was born in [place]  next to [facility].  Apparently only moved to [place]  not too long ago.  He was living alone up until the illness in November and currently residing at [facility].  Does not look like he has much family here he is not married and has no children     Family medical history  :       Partly mom died while sleeping in her 42s of unknown etiology, no family history of chronic kidney disease        REVIEW OF SYSTEMS:     All pertinent ROS neg except   Over a dark stool but he is not aware of that    Medication :     Reviewed, see in epic    BP 100/69   Pulse 96   Temp 98.9 F (37.2 C) (Axillary)   Resp 15   Ht 1.753 m (5' 9)   Wt 50.8 kg (112 lb)   SpO2 96%   BMI 16.54 kg/m     PHYSICAL EXAM:  General appearance: Thin, without any acute distress  HEENT: Mild conjunctival pallor  Neck: Left intrajugular tunneled cuffed dialysis catheter  Heart: Tachycardia during my examination  LUNGS: Coarse breath sound  Abdomen: Soft, nontender  Extremities: Left above-knee amputation and right leg 2+ edema    LABS:  Reviewed, see in epic            IMPRESSION:  Stage III acute kidney disease-with underlying chronic kidney stage G3a A1-looks like with hydronephrosis likely from acute bladder obstruction  Recent compartment syndrome peripheral arterial disease resulting in left below-knee amputation-now with anasarca and hypoalbuminemia  Anemia presumably gastrointestinal bleeding with underlying diabetes    Recommendations/ PLAN:    He was evaluated by urology service and Foley was inserted, his albumin is extremely, low although it could be from recent acute illness, prolonged hospitalization and  acute kidney disease etc., he did not have much proteinuria as early as June 2025, but will make sure that he does not have nephrotic syndrome or proteinuria, he of course will get gastrointestinal workup.  Since his acute kidney disease mainly from compartment syndrome/rhabdomyolysis/muscle injury, he has very good chance of recovery almost 99%.  He will of course need diuretics-but I will wait until he become hemodynamic stable and gastrointestinal workup does not suggest acute bleeding.  There is no plan for me to dialyze  him ,I think he should recover, if not only will do dialysis as a default.  Optimize therapy for other chronic condition.  Follow clinically and biochemically        "

## 2024-10-09 NOTE — Plan of Care (Signed)
"    Problem: Chronic Conditions and Co-morbidities  Goal: Patient's chronic conditions and co-morbidity symptoms are monitored and maintained or improved  10/09/2024 2334 by Achilles Norleen Lenis, RN  Outcome: Progressing  10/09/2024 1102 by Roxan Maxwell, RN  Outcome: Progressing     Problem: Discharge Planning  Goal: Discharge to home or other facility with appropriate resources  10/09/2024 2334 by Achilles Norleen Lenis, RN  Outcome: Progressing  10/09/2024 1102 by Roxan Maxwell, RN  Outcome: Progressing     Problem: Pain  Goal: Verbalizes/displays adequate comfort level or baseline comfort level  10/09/2024 2334 by Achilles Norleen Lenis, RN  Outcome: Progressing  10/09/2024 1102 by Roxan Maxwell, RN  Outcome: Progressing     Problem: Skin/Tissue Integrity  Goal: Skin integrity remains intact  Description: 1.  Monitor for areas of redness and/or skin breakdown  2.  Assess vascular access sites hourly  3.  Every 4-6 hours minimum:  Change oxygen saturation probe site  4.  Every 4-6 hours:  If on nasal continuous positive airway pressure, respiratory therapy assess nares and determine need for appliance change or resting period  10/09/2024 2334 by Achilles Norleen Lenis, RN  Outcome: Progressing  10/09/2024 1102 by Roxan Maxwell, RN  Outcome: Progressing  Flowsheets (Taken 10/09/2024 1101)  Skin Integrity Remains Intact: Monitor for areas of redness and/or skin breakdown     Problem: Safety - Adult  Goal: Free from fall injury  10/09/2024 2334 by Achilles Norleen Lenis, RN  Outcome: Progressing  10/09/2024 1102 by Roxan Maxwell, RN  Outcome: Progressing     "

## 2024-10-09 NOTE — Consults (Signed)
 "    [address]   [place], [place]  [address]   Urology Consult Note  [facility] [phone] + [phone]    Date: 10/09/2024   Patient: Joe Avery   DOB: Sep 23, 1961   DOA: 10/08/2024   MRN: [id number]   ROOM#: [id number]/[id number]-A     Reason for Consult: Urine retention, AKI  Requesting Physician: Dr. Author  Collaborating Urologist on Call at time of admission: Dr. Ulysess  Chief Complaint: Melena    History Obtained From: patient, electronic medical record    HISTORY OF PRESENT ILLNESS:      The patient is a 63 y.o. 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  Also with recent acute renal failure suspected to be secondary to rhabdomyolysis requiring dialysis.     He presented to [facility] ER from [facility] with complaints of melanotic stool.  Workup in the ER with hemoglobin of 5.2.  Creatinine was 4.1.  GI was consulted.  Nephrology was also consulted in case of need for dialysis.  Later found to have urine retention and urology was consulted due to history of difficult urethral anatomy.  Foley was placed 11/4 by urology service requiring cystoscopy but patient was transferred to [facility] and unknown when his Foley was removed.  Bladder US  confirmed urine retention of nearly 800 cc and CT A/P showed mild bilateral hydronephrosis.  This morning, hemoglobin has improved to 9.0 and creatinine is 4.5.    On my arrival, patient resting comfortably.  Mild pelvic pressure but no acute distress.  Has external catheter in place with small amount of darker yellow urine output..  We discussed options for Foley placement, possible cystoscopy, and possible suprapubic catheter placement which he may ultimately require.  He is agreeable to attempting  bedside Foley placement and seems agreeable to suprapubic catheter placement if unsuccessful or if indicated in the future.    Procedure note: The patient was prepped and draped in the usual sterile fashion.  A 33F Foley catheter was sterilely inserted and advanced to the bifurcation with no significant resistance.  There was return of yellow urine in the tubing.  Balloon inflated with 10 cc sterile water .  Catheter secured with an appropriate device.  The patient was then cleaned and dried.  He tolerated this well and no complications occurred.    ED Provider Note 10/08/2024: Pleasant 63 year old male presenting to the emergency department as above. Arrives mildly tachycardic but otherwise hemodynamically stable. MAP greater than 65. Mentating appropriately. Concern for melanotic stools. Recently started on anticoagulation secondary to lower extremity ischemia. On arrival, patient is asymptomatic. Based off of his presentation, the below workup was initiated. CBC confirms acute blood loss anemia with a hemoglobin of 5.2. Based off my rectal exam, this is melanotic stool, concerning for GI bleed. Protonix  provided. Consent obtained and will transfuse. GI consulted. Agreeable with medical management, no need to reverse anticoagulation. INR 1.9. Magnesium  replaced. Complete metabolic panel does show evidence of acute kidney injury with a creatinine of 4.1 however his potassium and remainder of electrolytes are grossly unremarkable. Consider prerenal in the setting of severe anemia. Will hold off on IV fluid resuscitation to prevent worsening dilution in the setting of severe anemia. Patient remains without any abdominal pain, there is no bright  red blood per rectum. At this point I do not believe CTA of the abdomen pelvis is warranted however did consider. Patient updated with plans for admission and is agreeable. Message sent to the hospitalist.     Past Medical History:        Diagnosis Date    Anemia due to GI  blood loss 10/08/2024    Hypertension     Type 2 diabetes mellitus without complication, without long-term current use of insulin  (HCC) 08/11/2023     Past Surgical History:        Procedure Laterality Date    CARDIAC PROCEDURE N/A 09/12/2024    Peripheral angiography performed by Bertell Clarine Nurse, MD at [facility] CARDIAC CATH LAB    COLONOSCOPY N/A 02/23/2024    COLONOSCOPY POLYPECTOMY SNARE/BIOPSY performed by Madie Dine, MD at [facility] ENDOSCOPY    INVASIVE VASCULAR N/A 09/12/2024    Thrombectomy peripheral artery performed by Bertell Clarine Nurse, MD at [facility] CARDIAC CATH LAB    INVASIVE VASCULAR N/A 09/13/2024    Thrombolysis peripheral artery performed by Merrianne Deatrice GORMAN DELENA, MD at [facility] CARDIAC CATH LAB     Current Medications:   Current Facility-Administered Medications: 0.9 % sodium chloride  infusion, , IntraVENous, PRN  [Held by provider] amLODIPine  (NORVASC ) tablet 10 mg, 10 mg, Oral, Daily  [Held by provider] aspirin  chewable tablet 81 mg, 81 mg, Oral, Daily  [Held by provider] atorvastatin  (LIPITOR ) tablet 40 mg, 40 mg, Oral, Nightly  [Held by provider] gabapentin  (NEURONTIN ) capsule 100 mg, 100 mg, Oral, BID  [Held by provider] lisinopril  (PRINIVIL ;ZESTRIL ) tablet 40 mg, 40 mg, Oral, Daily  glucose chewable tablet 16 g, 4 tablet, Oral, PRN  dextrose  bolus 10% 125 mL, 125 mL, IntraVENous, PRN **OR** dextrose  bolus 10% 250 mL, 250 mL, IntraVENous, PRN  glucagon  injection 1 mg, 1 mg, SubCUTAneous, PRN  dextrose  10 % infusion, , IntraVENous, Continuous PRN  insulin  lispro (HUMALOG ,ADMELOG ) injection vial 0-4 Units, 0-4 Units, SubCUTAneous, 4x Daily AC & HS  pantoprazole  (PROTONIX ) injection 40 mg, 40 mg, IntraVENous, BID  sodium chloride  flush 0.9 % injection 5-40 mL, 5-40 mL, IntraVENous, 2 times per day  sodium chloride  flush 0.9 % injection 5-40 mL, 5-40 mL, IntraVENous, PRN  0.9 % sodium chloride  infusion, , IntraVENous, PRN  ondansetron  (ZOFRAN -ODT) disintegrating tablet 4 mg, 4 mg, Oral, Q8H PRN **OR** ondansetron   (ZOFRAN ) injection 4 mg, 4 mg, IntraVENous, Q6H PRN  acetaminophen  (TYLENOL ) tablet 650 mg, 650 mg, Oral, Q6H PRN **OR** acetaminophen  (TYLENOL ) suppository 650 mg, 650 mg, Rectal, Q6H PRN  0.9 % sodium chloride  infusion, , IntraVENous, PRN    Allergies:  Patient has no known allergies.    Social History:   TOBACCO:   reports that he has been smoking cigarettes. He started smoking about 44 years ago. He has a 21.7 pack-year smoking history. He has never used smokeless tobacco.  ETOH:   reports no history of alcohol use.  DRUGS:   reports that he does not currently use drugs.    Family History:       Problem Relation Age of Onset    Diabetes Father        REVIEW OF SYSTEMS:     Constitutional symptoms: (-) fever  (-) chills  (-) headache  (+) weakness/fatigue  Eyes: (-) blurred vision, (-) double vision  (-) pain  Allergic/Immunologic:  (-) hay fever  Neurological: (-) tremors  (-) dizzy spells  (-) numbness/tingling  Endocrine: (-)  excessive thirst  (-) feeling too hot/cold  Gastrointestinal: (+) abdominal pain  (-) nausea/vomiting  (+) bloody stool  Cardiovascular: (-) chest pain   Integumentary: (-)  rash  (-) persistent itch  Musculoskeletal: (-) joint pain (-) neck pain   Ear/Nose/Throat/Mouth: (-) ear infection (-) sore throat (-) sinus problems  Genitourinary: (-) hematuria (-) dysuria;  refer to HPI  Respiratory:  (-) wheezing (-) persistent cough (-) SOB (-) DOE    PHYSICAL EXAM:      VITALS:  BP 100/69   Pulse 96   Temp 98.9 F (37.2 C) (Axillary)   Resp 15   Ht 1.753 m (5' 9)   Wt 50.8 kg (112 lb)   SpO2 96%   BMI 16.54 kg/m      TEMPERATURE:  Current - Temp: 98.9 F (37.2 C); Max - Temp  Avg: 98.1 F (36.7 C)  Min: 97.6 F (36.4 C)  Max: 99.5 F (37.5 C)  24HR BLOOD PRESSURE RANGE:  Systolic (24hrs), Avg:102 , Min:90 , Max:135   ; Diastolic (24hrs), Avg:74, Min:58, Max:88    8HR INTAKE OUTPUT:  No intake/output data recorded.  URINARY CATHETER OUTPUT (Foley):     DRAIN/TUBE OUTPUT:         Physical Exam:  General appearance: alert, appears stated age, cooperative, fatigued, no distress, and pale  Head: Normocephalic, without obvious abnormality, atraumatic  Back: No CVA tenderness  Lungs: Breathing unlabored  Abdomen: Soft, nontender, nondistended  GU: Indwelling Foley  Extremities:  BLE edema, left AKA  Neurologic: Nonfocal    DATA:    WBC:    Lab Results   Component Value Date/Time    WBC 11.1 10/09/2024 04:35 AM     Hemoglobin/Hematocrit:    Lab Results   Component Value Date/Time    HGB 9.0 10/09/2024 04:35 AM    HCT 26.8 10/09/2024 04:35 AM     BMP:    Lab Results   Component Value Date/Time    NA 136 10/09/2024 04:35 AM    K 4.9 10/09/2024 04:35 AM    CL 101 10/09/2024 04:35 AM    CO2 28 10/09/2024 04:35 AM    BUN 52 10/09/2024 04:35 AM    LABALBU SPECIMEN QUANTITY NOT SUFFICIENT 06/11/2023 02:51 PM    CREATININE 4.5 10/09/2024 04:35 AM    CALCIUM  7.4 10/09/2024 04:35 AM    LABGLOM 13 10/09/2024 04:35 AM     PT/INR:    Lab Results   Component Value Date/Time    PROTIME 18.9 10/09/2024 04:35 AM    INR 1.5 10/09/2024 04:35 AM       Imaging:  CT ABDOMEN PELVIS WO CONTRAST Additional Contrast? None  Result Date: 10/09/2024  IMPRESSION: 1.  Mild bilateral hydronephrosis. Prominent distention of the urinary bladder. Findings could be related to urinary retention. Recommend clinical correlation and follow-up. 2.  Diffuse mesenteric and body wall edema, suggestive of anasarca. 3.  Large bilateral pleural effusions. This dictation was created with voice recognition software.  While attempts have  been made to review the dictation as it is transcribed, on occasion the spoken word can be misinterpreted by the technology leading to omissions or inappropriate words, phrases or sentences.  Dictated and Electronically Signed By: Iantha Bumpers, MD [facility] Reading Station:  [id number]  10/08/2024 22:36        US  PELVIS LIMITED  Result Date: 10/08/2024  US  PELVIS LIMITED  DATE OF SERVICE:   10/08/2024 17:57 CLINICAL INDICATION: bladder to verify retention as requested by urology team PATIENT INFORMATION: Bladder distention  COMPARISON: None TECHNIQUE:  Real-time imaging of the urinary bladder FINDINGS:  Bladder contour is preserved. The bladder is distended. Estimated at 770 mL fluid. No filling defects or wall thickening. IMPRESSION: Distended urinary bladder.  Dictated and Electronically Signed By: Chyrl Deters, DO [facility] Reading Station:  [facility]  10/08/2024 20:47          Assessment & Plan:      Joe Avery is a 63 y.o. 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    1) Urine retention: 54F Foley placed at bedside returning ~1L of amber urine.  Maintain Foley at this time, at least until renal function normalizes and no longer needing diuresis.  May require suprapubic catheter placement for future management but OK to hold off on IR consult at this time.    2) AKI on CKD: With recent history of acute renal failure with oliguria requiring dialysis.     Creatinine 4.5.  Nephrology is following.    Patient seen and examined, chart reviewed.   Electronically signed by Millard CHRISTELLA Atkinson, PA on 10/09/2024 at 7:44 AM    "

## 2024-10-10 LAB — CBC WITH AUTO DIFFERENTIAL
Basophils %: 1 % (ref 0–1)
Basophils %: 1 % (ref 0–1)
Basophils Absolute: 0.13 k/uL
Basophils Absolute: 0.11 k/uL
Eosinophils %: 4 % — ABNORMAL HIGH (ref 0.0–3.0)
Eosinophils %: 3 % (ref 0.0–3.0)
Eosinophils Absolute: 0.39 k/uL
Eosinophils Absolute: 0.3 k/uL
Hematocrit: 32.1 % — ABNORMAL LOW (ref 42.0–52.0)
Hematocrit: 31.9 % — ABNORMAL LOW (ref 42.0–52.0)
Hemoglobin: 10.7 g/dL — ABNORMAL LOW (ref 13.5–18.0)
Hemoglobin: 11 g/dL — ABNORMAL LOW (ref 13.5–18.0)
Immature Granulocytes %: 1 % — ABNORMAL HIGH
Immature Granulocytes %: 1 % — ABNORMAL HIGH
Immature Granulocytes Absolute: 0.11 k/uL
Immature Granulocytes Absolute: 0.1 k/uL
Lymphocytes %: 15 % — ABNORMAL LOW (ref 24–44)
Lymphocytes %: 14 % — ABNORMAL LOW (ref 24–44)
Lymphocytes Absolute: 1.52 k/uL
Lymphocytes Absolute: 1.51 k/uL
MCH: 29.3 pg (ref 27.0–31.0)
MCH: 29.6 pg (ref 27.0–31.0)
MCHC: 33.3 g/dL (ref 32.0–36.0)
MCHC: 34.5 g/dL (ref 32.0–36.0)
MCV: 87.9 fL (ref 78.0–100.0)
MCV: 85.8 fL (ref 78.0–100.0)
MPV: 10.5 fL (ref 7.5–11.1)
Monocytes %: 9 % — ABNORMAL HIGH (ref 0–5)
Monocytes %: 8 % — ABNORMAL HIGH (ref 0–5)
Monocytes Absolute: 0.93 k/uL
Monocytes Absolute: 0.84 k/uL
Neutrophils %: 71 % — ABNORMAL HIGH (ref 36–66)
Neutrophils %: 74 % — ABNORMAL HIGH (ref 36–66)
Neutrophils Absolute: 7.34 k/uL
Neutrophils Absolute: 7.92 k/uL
Platelets: 208 k/uL (ref 140–440)
RBC: 3.65 m/uL — ABNORMAL LOW (ref 4.60–6.20)
RBC: 3.72 m/uL — ABNORMAL LOW (ref 4.60–6.20)
RDW: 14.5 % (ref 11.7–14.9)
RDW: 14.4 % (ref 11.7–14.9)
WBC: 10.4 k/uL (ref 4.0–10.5)
WBC: 10.8 k/uL — ABNORMAL HIGH (ref 4.0–10.5)

## 2024-10-10 LAB — COMPREHENSIVE METABOLIC PANEL
ALT: 17 U/L (ref 10–40)
AST: 33 U/L (ref 15–37)
Albumin/Globulin Ratio: 0.9 — ABNORMAL LOW (ref 1.1–2.2)
Albumin: 1.9 g/dL — ABNORMAL LOW (ref 3.4–5.0)
Alkaline Phosphatase: 91 U/L (ref 40–129)
Anion Gap: 12 mmol/L (ref 9–17)
BUN: 62 mg/dL — ABNORMAL HIGH (ref 7–20)
CO2: 19 mmol/L — ABNORMAL LOW (ref 21–32)
Calcium: 7.6 mg/dL — ABNORMAL LOW (ref 8.3–10.6)
Chloride: 100 mmol/L (ref 99–110)
Creatinine: 4.7 mg/dL — ABNORMAL HIGH (ref 0.8–1.3)
Est, Glom Filt Rate: 13 mL/min/1.73m2 — ABNORMAL LOW (ref >60)
Glucose: 153 mg/dL — ABNORMAL HIGH (ref 74–99)
Potassium: 5.1 mmol/L (ref 3.5–5.1)
Sodium: 132 mmol/L — ABNORMAL LOW (ref 136–145)
Total Bilirubin: 0.6 mg/dL (ref 0.0–1.0)
Total Protein: 4.2 g/dL — ABNORMAL LOW (ref 6.4–8.2)

## 2024-10-10 LAB — HEMOGLOBIN AND HEMATOCRIT
Hematocrit: 14.5 % — ABNORMAL LOW (ref 42.0–52.0)
Hematocrit: 28.3 % — ABNORMAL LOW (ref 42.0–52.0)
Hemoglobin: 4.8 g/dL — CL (ref 13.5–18.0)
Hemoglobin: 9.8 g/dL — ABNORMAL LOW (ref 13.5–18.0)

## 2024-10-10 LAB — MAGNESIUM: Magnesium: 1.9 mg/dL (ref 1.8–2.4)

## 2024-10-10 LAB — POCT GLUCOSE
POC Glucose: 192 mg/dL — ABNORMAL HIGH (ref 74–99)
POC Glucose: 160 mg/dL — ABNORMAL HIGH (ref 74–99)
POC Glucose: 121 mg/dL — ABNORMAL HIGH (ref 74–99)
POC Glucose: 146 mg/dL — ABNORMAL HIGH (ref 74–99)

## 2024-10-10 LAB — PHOSPHORUS: Phosphorus: 4.7 mg/dL (ref 2.5–4.9)

## 2024-10-10 LAB — PLATELET CONFIRMATION: Platelet Confirmation: ADEQUATE

## 2024-10-10 LAB — CK: Total CK: 141 U/L (ref 26–192)

## 2024-10-10 MED ORDER — LIDOCAINE HCL (PF) 2 % IJ SOLN
2 | Freq: Once | INTRAMUSCULAR | Status: DC | PRN
Start: 2024-10-10 — End: 2024-10-10
  Administered 2024-10-10: 17:00:00 80 via INTRAVENOUS

## 2024-10-10 MED ORDER — FUROSEMIDE 10 MG/ML IJ SOLN
10 | Freq: Two times a day (BID) | INTRAMUSCULAR | Status: DC
Start: 2024-10-10 — End: 2024-10-11
  Administered 2024-10-10 – 2024-10-11 (×3): 80 mg via INTRAVENOUS

## 2024-10-10 MED ORDER — SODIUM CHLORIDE 0.9 % IV SOLN
0.9 | INTRAVENOUS | Status: AC | PRN
Start: 2024-10-10 — End: 2024-10-10
  Administered 2024-10-10: 16:00:00 via INTRAVENOUS

## 2024-10-10 MED ORDER — PROPOFOL 200 MG/20ML IV EMUL
200 | Freq: Once | INTRAVENOUS | Status: DC | PRN
Start: 2024-10-10 — End: 2024-10-10
  Administered 2024-10-10: 17:00:00 20 via INTRAVENOUS
  Administered 2024-10-10: 17:00:00 60 via INTRAVENOUS

## 2024-10-10 MED ORDER — PEG 3350-KCL-NABCB-NACL-NASULF 236 G PO SOLR
236 | Freq: Once | ORAL | Status: AC
Start: 2024-10-10 — End: 2024-10-10
  Administered 2024-10-10: 21:00:00 4000 mL via ORAL

## 2024-10-10 MED ORDER — PROPOFOL 200 MG/20ML IV EMUL
200 | INTRAVENOUS | Status: AC
Start: 2024-10-10 — End: 2024-10-10

## 2024-10-10 MED FILL — INSULIN LISPRO 100 UNIT/ML IJ SOLN: 100 [IU]/mL | INTRAMUSCULAR | Qty: 1 | Fill #0

## 2024-10-10 MED FILL — PANTOPRAZOLE SODIUM 40 MG IV SOLR: 40 mg | INTRAVENOUS | Qty: 40 | Fill #0

## 2024-10-10 MED FILL — FUROSEMIDE 10 MG/ML IJ SOLN: 10 mg/mL | INTRAMUSCULAR | Qty: 8 | Fill #0

## 2024-10-10 MED FILL — DIPRIVAN 200 MG/20ML IV EMUL: 200 MG/20ML | INTRAVENOUS | Qty: 20 | Fill #0

## 2024-10-10 MED FILL — ACETAMINOPHEN 325 MG PO TABS: 325 mg | ORAL | Qty: 2 | Fill #0

## 2024-10-10 MED FILL — ONDANSETRON 4 MG PO TBDP: 4 mg | ORAL | Qty: 1 | Fill #0

## 2024-10-10 NOTE — Progress Notes (Signed)
"  Report received from [place], [place]. Patient is Alert and Oriented x 3-4 . Name, DOB, procedure and allergies verified. Consents reviewed and signed. Pt has not received Eliquis, ASA or Coreg since admission on 11/29 - pt is unclear if he is still taking them at [facility]. IV CDI and infusing with LR. Pt denies having any metal implants. All questions answered at this time.    Kevan Bing, RN      "

## 2024-10-10 NOTE — Brief Op Note (Signed)
 "Brief Postoperative Note      Patient: Joe Avery  Date of Birth: 1960-11-29  MRN: [id number]    Date of Procedure: 2024-10-13    Pre-Op Diagnosis Codes:      * Anemia, unspecified type [D64.9]     * Gastrointestinal hemorrhage, unspecified gastrointestinal hemorrhage type [K92.2]    Post-Op Diagnosis: No bleeding, Duodenal nodularity biopsied.        Procedure(s):  ESOPHAGOGASTRODUODENOSCOPY BIOPSY    Surgeon(s):  Madie Dine, MD    Assistant:  * No surgical staff found *    Anesthesia: Monitor Anesthesia Care    Estimated Blood Loss (mL): Minimal    Complications: None    Specimens:   ID Type Source Tests Collected by Time Destination   A : Duodenal Nodularity Biopsies Tissue Duodenum SURGICAL PATHOLOGY Madie Dine, MD October 13, 2024 1138        Implants:  * No implants in log *    Hemostatic Agents/Irrigation Solution (excluding Saline & Sterile Water  Irrigation):  No hemostatic agent or irrigation used        Drains:   Urinary Catheter 10/09/24 Foley (Active)   Catheter Indications Urinary retention (acute or chronic), continuous bladder irrigation or bladder outlet obstruction 2024/10/13 0941   Site Assessment Pink 2024/10/13 0941   Urine Color Yellow 13-Oct-2024 0941   Urine Appearance Cloudy Oct 13, 2024 0941   Urine Odor Malodorous October 13, 2024 0941   Collection Container Standard 10/13/24 0941   Securement Method Securing device (Describe) 10-13-2024 0941   Catheter Care  Chlorhexidine  Wipes 10/13/2024 0941   Catheter Best Practices  Drainage tube clipped to bed;Catheter secured to thigh;Tamper seal intact;Bag below bladder;Bag not on floor;Lack of dependent loop in tubing;Drainage bag less than half full 10/13/24 0941   Status Draining;Patent October 13, 2024 0941   Output (mL) 350 mL 2024-10-13 0941       [REMOVED] Urinary Catheter 09/13/24 Other (comment) (Removed)   $ Urethral catheter insertion $ Not inserted for procedure 09/13/24 1322   Catheter Indications Need for fluid volume management of the critically ill patient in a  critical care setting 09/13/24 1330   Site Assessment Bleeding 09/13/24 1330   Urine Color Tea 09/13/24 1446   Urine Appearance Clear 09/13/24 1322   Collection Container Standard 09/13/24 1330   Securement Method Leg strap 09/13/24 1330   Catheter Care  Perineal wipes 09/13/24 1322   Catheter Best Practices  Drainage tube clipped to bed;Catheter secured to thigh;Tamper seal intact;Bag below bladder;Bag not on floor;Lack of dependent loop in tubing;Drainage bag less than half full 09/13/24 1330   Status Draining;Patent 09/13/24 1330   Output (mL) 75 mL 09/13/24 1630       [REMOVED] External Urinary Catheter (Removed)   Site Assessment Clean,dry & intact 09/13/24 0100   Placement Replaced 09/13/24 0100   Catheter Care Catheter/Wick replaced;Suction Canister/Tubing changed 09/13/24 0100   Perineal Care Yes 09/13/24 0100   Suction 09/13/24 0755   Urine Color Brown 09/13/24 0755   Urine Appearance Clear 09/13/24 0755   Output (mL) 150 mL 09/13/24 0755       [REMOVED] External Urinary Catheter (Removed)   Suction 10/09/24 0554   Urine Color Amber 10/09/24 0554   Urine Appearance Hazy 10/09/24 0554   Output (mL) 250 mL 10/09/24 0554       Findings:  Present At Time Of Surgery (PATOS) (choose all levels that have infection present):  No infection present      Electronically signed by Dine Madie, MD on 10-13-2024  at 11:46 AM  "

## 2024-10-10 NOTE — Anesthesia Pre Procedure (Deleted)
 "Department of Anesthesiology  Preprocedure Note       Name:  Joe Avery   Age:  63 y.o.  DOB:  03-May-1961                                          MRN:  [id number]         Date:  10/10/2024      Surgeon: Clotilde):  Madie Dine, MD    Procedure: Procedure(s):  COLONOSCOPY DIAGNOSTIC    Medications prior to admission:   Prior to Admission medications   Medication Sig Start Date End Date Taking? Authorizing Provider   Glucagon  3 MG/DOSE POWD 3 mg by Nasal route as needed (for low blood sugar)   Yes [provider]   carvedilol (COREG) 6.25 MG tablet Take 1 tablet by mouth 2 times daily (with meals)   Yes [provider]   apixaban (ELIQUIS) 5 MG TABS tablet Take 1 tablet by mouth 2 times daily   Yes [provider]   amitriptyline (ELAVIL) 10 MG tablet Take 1 tablet by mouth 2 times daily   Yes [provider]   mirtazapine (REMERON) 15 MG tablet Take 0.5 tablets by mouth nightly   Yes [provider]   oxyCODONE (ROXICODONE) 5 MG immediate release tablet Take 1 tablet by mouth every 8 hours as needed for Pain. Max Daily Amount: 15 mg   Yes [provider]   polyethylene glycol (GLYCOLAX ) 17 g packet Take 1 packet by mouth daily   Yes [provider]   aspirin  81 MG chewable tablet Take 1 tablet by mouth daily 12/14/23  Yes D'Entremont, Mabel DASEN, MD   gabapentin  (NEURONTIN ) 100 MG capsule Take 1 capsule by mouth in the morning and at bedtime. 08/06/23 10/09/24 Yes Fernande Alfonso Sahara, APRN - CNP   amLODIPine  (NORVASC ) 10 MG tablet Take 1 tablet by mouth daily 02/02/23 10/09/24 Yes [provider]   atorvastatin  (LIPITOR ) 40 MG tablet Take 1 tablet by mouth nightly  Patient not taking: Reported on 10/09/2024 12/13/23   D'Entremont, Mabel DASEN, MD   metFORMIN (GLUCOPHAGE) 500 MG tablet Take 1 tablet by mouth daily (with breakfast)  Patient not taking: Reported on 10/09/2024 08/12/23   [provider]   raNITIdine HCl (RANITIDINE 150 MAX  STRENGTH PO) Take by mouth as needed  Patient not taking: Reported on 10/09/2024    [provider]   Aspirin -Acetaminophen -Caffeine (EXCEDRIN PO) Take by mouth as needed  Patient not taking: Reported on 10/09/2024    [provider]   lisinopril  (PRINIVIL ;ZESTRIL ) 40 MG tablet Take 1 tablet by mouth daily 02/02/23 05/18/24  [provider]       Current medications:    Current Facility-Administered Medications   Medication Dose Route Frequency Provider Last Rate Last Admin    furosemide  (LASIX ) injection 80 mg  80 mg IntraVENous BID Laroy Gatha Pulling, MD   80 mg at 10/10/24 0936    0.9 % sodium chloride  infusion   IntraVENous PRN Author Head, MD        [facility] by provider] aspirin  chewable tablet 81 mg  81 mg Oral Daily Author Head, MD        [Held by provider] atorvastatin  (LIPITOR ) tablet 40 mg  40 mg Oral Nightly Author Head, MD        glucose chewable tablet 16 g  4  tablet Oral PRN Author Head, MD        dextrose  bolus 10% 125 mL  125 mL IntraVENous PRN Author Head, MD        Or    dextrose  bolus 10% 250 mL  250 mL IntraVENous PRN Author Head, MD        glucagon  injection 1 mg  1 mg SubCUTAneous PRN Author Head, MD        dextrose  10 % infusion   IntraVENous Continuous PRN Author Head, MD        insulin  lispro (HUMALOG ,ADMELOG ) injection vial 0-4 Units  0-4 Units SubCUTAneous 4x Daily AC & HS Author Head, MD   1 Units at 10/09/24 2131    pantoprazole  (PROTONIX ) injection 40 mg  40 mg IntraVENous BID Author Head, MD   40 mg at 10/10/24 0934    sodium chloride  flush 0.9 % injection 5-40 mL  5-40 mL IntraVENous 2 times per day Author Head, MD   10 mL at 10/10/24 0940    sodium chloride  flush 0.9 % injection 5-40 mL  5-40 mL IntraVENous PRN Author Head, MD        0.9 % sodium chloride  infusion   IntraVENous PRN Author Head, MD        ondansetron  (ZOFRAN -ODT) disintegrating tablet 4 mg  4 mg Oral Q8H PRN Author Head, MD        Or    ondansetron  (ZOFRAN ) injection  4 mg  4 mg IntraVENous Q6H PRN Author Head, MD        acetaminophen  (TYLENOL ) tablet 650 mg  650 mg Oral Q6H PRN Author Head, MD   650 mg at 10/10/24 1422    Or    acetaminophen  (TYLENOL ) suppository 650 mg  650 mg Rectal Q6H PRN Author Head, MD        0.9 % sodium chloride  infusion   IntraVENous PRN Markovitz, Christina L, APRN - CNP           Allergies:  No Known Allergies    Problem List:    Patient Active Problem List   Diagnosis Code    Chronic kidney disease, stage III (moderate) (HCC) N18.30    Primary hypertension I10    Low vitamin D  level R79.89    Type 2 diabetes mellitus without complication, without long-term current use of insulin  (HCC) E11.9    Persistent proteinuria R80.1    Stroke-like symptoms R29.90    Primary insomnia F51.01    Chronic kidney disease, 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    Acute lower limb ischemia I99.8    Critical limb ischemia of left lower extremity (HCC) I70.222    Anemia due to GI blood loss D50.0    Gastrointestinal hemorrhage K92.2       Past Medical History:        Diagnosis Date    Anemia due to GI blood loss 10/08/2024    Hypertension     Type 2 diabetes mellitus without complication, without long-term current use of insulin  (HCC) 08/11/2023       Past Surgical History:        Procedure Laterality Date    CARDIAC PROCEDURE N/A 09/12/2024    Peripheral angiography performed by Bertell Clarine Nurse, MD at [facility] CARDIAC CATH LAB    COLONOSCOPY N/A 02/23/2024    COLONOSCOPY POLYPECTOMY SNARE/BIOPSY performed by Madie Dine, MD at [facility] ENDOSCOPY    INVASIVE VASCULAR N/A 09/12/2024    Thrombectomy peripheral artery performed by Bertell Clarine Nurse, MD at [facility]  CARDIAC CATH LAB    INVASIVE VASCULAR N/A 09/13/2024    Thrombolysis peripheral artery performed by Merrianne Deatrice GORMAN DELENA, MD at [facility] CARDIAC CATH LAB       Social History:    Social History     Tobacco Use    Smoking status: Every Day     Current packs/day: 0.10     Average packs/day: 0.5 packs/day for 44.9 years (21.7 ttl pk-yrs)      Types: Cigarettes     Start date: 49    Smokeless tobacco: Never    Tobacco comments:     Down to 3 per day   Substance Use Topics    Alcohol use: Never                                Ready to quit: Not Answered  Counseling given: Not Answered  Tobacco comments: Down to 3 per day      Vital Signs (Current):   Vitals:    10/10/24 1007 10/10/24 1158 10/10/24 1233 10/10/24 1600   BP: 123/86 121/83 (!) 136/96 129/81   Pulse: (!) 103 (!) 104 (!) 109 (!) 105   Resp: 16 17 21 14    Temp: 36.2 C (97.1 F)  36.4 C (97.6 F) 37.2 C (99 F)   TempSrc: Temporal   Oral   SpO2: 100% 99% 96% 95%   Weight:       Height:                                                  BP Readings from Last 3 Encounters:   10/10/24 129/81   09/13/24 101/70   05/18/24 (!) 142/70       NPO Status: Time of last liquid consumption: 0000                        Time of last solid consumption: 0000                        Date of last liquid consumption: 10/09/24                        Date of last solid food consumption: 10/09/24    BMI:   Wt Readings from Last 3 Encounters:   10/08/24 50.8 kg (112 lb)   09/13/24 50.9 kg (112 lb 3.4 oz)   05/18/24 55.2 kg (121 lb 9.6 oz)     Body mass index is 16.54 kg/m.    CBC:   Lab Results   Component Value Date/Time    WBC 10.8 10/10/2024 09:08 AM    RBC 3.72 10/10/2024 09:08 AM    HGB 9.8 10/10/2024 04:26 PM    HCT 28.3 10/10/2024 04:26 PM    MCV 85.8 10/10/2024 09:08 AM    RDW 14.4 10/10/2024 09:08 AM    PLT 208 10/10/2024 09:08 AM       CMP:   Lab Results   Component Value Date/Time    NA 132 10/10/2024 05:15 AM    K 5.1 10/10/2024 05:15 AM    CL 100 10/10/2024 05:15 AM    CO2 19 10/10/2024 05:15 AM    BUN 62 10/10/2024 05:15 AM  CREATININE 4.7 10/10/2024 05:15 AM    LABGLOM 13 10/10/2024 05:15 AM    GLUCOSE 153 10/10/2024 05:15 AM    CALCIUM  7.6 10/10/2024 05:15 AM    BILITOT 0.6 10/10/2024 05:15 AM    ALKPHOS 91 10/10/2024 05:15 AM    AST 33 10/10/2024 05:15 AM    ALT 17 10/10/2024 05:15 AM       POC  Tests:   Recent Labs     10/10/24  1550   POCGLU 146*       Coags:   Lab Results   Component Value Date/Time    PROTIME 16.4 10/09/2024 01:45 PM    INR 1.3 10/09/2024 01:45 PM    APTT >240.0 09/13/2024 01:42 PM       HCG (If Applicable): No results found for: PREGTESTUR, PREGSERUM, HCG, HCGQUANT     ABGs: No results found for: PHART, PO2ART, PCO2ART, HCO3ART, BEART, O2SATART     Type & Screen (If Applicable):  Lab Results   Component Value Date    ABORH A NEGATIVE 10/08/2024    LABANTI NEGATIVE 10/08/2024       Drug/Infectious Status (If Applicable):  No results found for: HIV, HEPCAB    COVID-19 Screening (If Applicable): No results found for: COVID19        Anesthesia Evaluation    Patient summary reviewed and Nursing notes reviewed      Airway:  Mallampati: II  TM distance: >3 FB   Neck ROM: full    Mouth opening: > = 3 FB   Dental:  normal exam         Pulmonary:   Negative Pulmonary ROS             Cardiovascular:       (+)     hypertension:                                                  Neuro/Psych:    Negative Neuro/Psych ROS             GI/Hepatic/Renal:    (+)         renal disease: CRI          Endo/Other:     (+) Diabetes: Type II DM                       Abdominal:              Vascular:  negative vascular ROS.       Other Findings:            Anesthesia Plan      MAC     ASA 3     (Char review based off of 10/10/2024 endo pre-eval)  Induction: intravenous.                            Ozell LITTIE Cobia, APRN - CRNA   10/10/2024            "

## 2024-10-10 NOTE — Care Coordination (Signed)
"     10/10/24 1519   Service Assessment   Information Provided By Patient   Patient Orientation Alert   Cognition No Apparent Deficit   Primary Caregiver Other (Comment)  Location Manager LTC)   Support Systems Parent   Patient's Healthcare Decision Maker is: [employer] Next of Kin   PCP Verified by CM Yes   Prior Functional Level Assistance with the following:;Mobility   History of falls? 0   Current Functional Level at Time of Initial Assessment Assistance with the following:;Mobility   Can patient return to prior living arrangement Yes   Ability to make needs known: Good   Family able to assist with home care needs: No   Receives Help From Other (Comment)  Location Manager LTC)   Current [facility] Other (Comment)  Location Manager LTC)     CM in to see Pt to initiate discharge planning.  Pt is from [facility] and plans to return.    CM call to [facility], verified Pt is long term care and can return when medically ready, no precert needed.     CM following   "

## 2024-10-10 NOTE — Progress Notes (Signed)
 "Nephrology Progress Note  10/10/2024 6:54 AM        Subjective:   Admit Date: 10/08/2024  PCP: Fernand Ruffing, MD    Interval History: No major event that I am aware of,    Diet: Probably clear liquid diet    ROS: No overt shortness of breath, urine output recorded about 1.9 L he of course does have Foley catheter, no fever and acceptable blood pressure    Data:     Current meds:    [Held by provider] amLODIPine   10 mg Oral Daily    [Held by provider] aspirin   81 mg Oral Daily    [Held by provider] atorvastatin   40 mg Oral Nightly    [Held by provider] gabapentin   100 mg Oral BID    [Held by provider] lisinopril   40 mg Oral Daily    insulin  lispro  0-4 Units SubCUTAneous 4x Daily AC & HS    pantoprazole   40 mg IntraVENous BID    sodium chloride  flush  5-40 mL IntraVENous 2 times per day      sodium chloride       sodium chloride       dextrose       sodium chloride       sodium chloride            I/O last 3 completed shifts:  In: 1616.8 [P.O.:180; I.V.:10; Blood:1426.8]  Out: 1200 [Urine:1200]    CBC:   Recent Labs     10/08/24  1304 10/08/24  2238 10/09/24  0435 10/09/24  1345 10/09/24  2051   WBC 8.5  --  11.1*  --   --    HGB 5.2*   < > 9.0* 7.5* 4.8*   PLT 364  --  249  --   --     < > = values in this interval not displayed.          Recent Labs     10/08/24  1304 10/09/24  0435 10/10/24  0515   NA 135* 136 132*   K 4.3 4.9 5.1   CL 99 101 100   CO2 25 28 19*   BUN 46* 52* 62*   CREATININE 4.1* 4.5* 4.7*   GLUCOSE 203* 156* 153*       Lab Results   Component Value Date    CALCIUM  7.6 (L) 10/10/2024    PHOS 4.7 10/10/2024       Objective:     Vitals: BP (!) 135/90   Pulse (!) 115   Temp 98.3 F (36.8 C) (Oral)   Resp 19   Ht 1.753 m (5' 9)   Wt 50.8 kg (112 lb)   SpO2 100%   BMI 16.54 kg/m ,    General appearance: Alert, awake and oriented with sarcopenia  HEENT: No gross conjunctival pallor dental caries  Neck: Left intrajugular tunneled cuffed dialysis catheter  Lungs: No gross crackles  Heart:  Tachycardia  Abdomen: Soft, nontender  Extremities: 2+ right lower extremity edema including thigh and below knee, left above-knee amputation      Problem List :         Impression :     Stage III acute kidney disease-mainly from compartment syndrome/rhabdomyolysis and now obstructive uropathy-although he does have underlying chronic condition and risk factors such as diabetes-he has good chance of recovery so we will sit tight until he is absolutely necessary to do dialysis-urine did not show any active sediment and has roughly 1 g proteinuria which very well  could be tubular proteinuria  Anasarca-does not have much proteinuria which rules out nephrotic syndrome, but his albumin is extremely low-so differential would include liver disease, malabsorption, inflammation, malnutrition etc.  Acute bladder obstruction likely from prostatomegaly with lying diabetes, and history of high blood pressure    Recommendation/Plan  :     I will try furosemide  80 mg twice a day first-just to see how he responds, as he very well could have renal venous congestion.  He has significant pleural effusion, if necessary can do thoracentesis and fluid analysis to make sure there is no exudative fluid.  Look for any other reason for hypoalbuminemia, I would improve the nutrition, no plan for dialysis.  Optimize therapy further chronic condition follow clinically and biochemically      Gatha Koleen Box, MD MD    "

## 2024-10-10 NOTE — Consults (Signed)
 "CARDIOLOGY CONSULT NOTE   Reason for consultation: Anticoagulation management    Referring physician:  Russella Merck, MD     Primary care physician: Fernand Ruffing, MD      Dear  Dr.  Russella Merck, MD   Thanks for the consult.    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 does not report any black stools or dizziness he has history of left iliac thrombectomy and angioplasty after presenting with left cold leg in November 2025  During hospitalization was also found to be in DKA he underwent successful mechanical thrombectomy and iliac artery stent placement cold leg resolved but he was discharged on antiplatelets after thrombectomy and fasciotomy  He was kept on Eliquis postprocedure  Further complicating issue is renal failure with creatinine of 4.7  And a hemoglobin of 4.8 on presentation  Upper endoscopy was normal    Past medical history:    has a past medical history of Anemia due to GI blood loss, Hypertension, and Type 2 diabetes mellitus without complication, without long-term current use of insulin  (HCC).  Past surgical history:   has a past surgical history that includes Colonoscopy (N/A, 02/23/2024); Cardiac procedure (N/A, 09/12/2024); invasive vascular (N/A, 09/12/2024); and invasive vascular (N/A, 09/13/2024).  Social History:   reports that he has been smoking cigarettes. He started smoking about 44 years ago. He has a 21.7 pack-year smoking history. He has never used smokeless tobacco. He reports that he does not currently use drugs. He reports that he does not drink alcohol.  Family history:   no family history of CAD, STROKE of DM at early age    No Known Allergies    furosemide  (LASIX ) injection 80 mg, BID  polyethylene glycol (GoLYTELY ) solution 4,000 mL, Once  0.9 % sodium chloride  infusion, PRN  [Held by  provider] aspirin  chewable tablet 81 mg, Daily  [Held by provider] atorvastatin  (LIPITOR ) tablet 40 mg, Nightly  glucose chewable tablet 16 g, PRN  dextrose  bolus 10% 125 mL, PRN   Or  dextrose  bolus 10% 250 mL, PRN  glucagon  injection 1 mg, PRN  dextrose  10 % infusion, Continuous PRN  insulin  lispro (HUMALOG ,ADMELOG ) injection vial 0-4 Units, 4x Daily AC & HS  pantoprazole  (PROTONIX ) injection 40 mg, BID  sodium chloride  flush 0.9 % injection 5-40 mL, 2 times per day  sodium chloride  flush 0.9 % injection 5-40 mL, PRN  0.9 % sodium chloride  infusion, PRN  ondansetron  (ZOFRAN -ODT) disintegrating tablet 4 mg, Q8H PRN   Or  ondansetron  (ZOFRAN ) injection 4 mg, Q6H PRN  acetaminophen  (TYLENOL ) tablet 650 mg, Q6H PRN   Or  acetaminophen  (TYLENOL ) suppository 650 mg, Q6H PRN  0.9 % sodium chloride  infusion, PRN      Current Facility-Administered Medications   Medication Dose Route Frequency Provider Last Rate Last Admin    furosemide  (LASIX ) injection 80 mg  80 mg IntraVENous BID Laroy Gatha Pulling, MD   80 mg at 10/10/24 0936    polyethylene glycol (GoLYTELY ) solution 4,000 mL  4,000 mL Oral Once Edhi, Ahmed, MD        0.9 % sodium chloride  infusion   IntraVENous PRN Author Head, MD        [facility] by provider] aspirin  chewable tablet 81 mg  81 mg Oral Daily Kunwar,  Melda, MD        [facility] by provider] atorvastatin  (LIPITOR ) tablet 40 mg  40 mg Oral Nightly Author Melda, MD        glucose chewable tablet 16 g  4 tablet Oral PRN Author Melda, MD        dextrose  bolus 10% 125 mL  125 mL IntraVENous PRN Author Melda, MD        Or    dextrose  bolus 10% 250 mL  250 mL IntraVENous PRN Author Melda, MD        glucagon  injection 1 mg  1 mg SubCUTAneous PRN Author Melda, MD        dextrose  10 % infusion   IntraVENous Continuous PRN Author Melda, MD        insulin  lispro (HUMALOG ,ADMELOG ) injection vial 0-4 Units  0-4 Units SubCUTAneous 4x Daily AC & HS Author Melda, MD   1 Units at 10/09/24 2131    pantoprazole   (PROTONIX ) injection 40 mg  40 mg IntraVENous BID Author Melda, MD   40 mg at 10/10/24 0934    sodium chloride  flush 0.9 % injection 5-40 mL  5-40 mL IntraVENous 2 times per day Author Melda, MD   10 mL at 10/10/24 0940    sodium chloride  flush 0.9 % injection 5-40 mL  5-40 mL IntraVENous PRN Author Melda, MD        0.9 % sodium chloride  infusion   IntraVENous PRN Author Melda, MD        ondansetron  (ZOFRAN -ODT) disintegrating tablet 4 mg  4 mg Oral Q8H PRN Author Melda, MD        Or    ondansetron  (ZOFRAN ) injection 4 mg  4 mg IntraVENous Q6H PRN Author Melda, MD        acetaminophen  (TYLENOL ) tablet 650 mg  650 mg Oral Q6H PRN Author Melda, MD   650 mg at 10/10/24 1422    Or    acetaminophen  (TYLENOL ) suppository 650 mg  650 mg Rectal Q6H PRN Author Melda, MD        0.9 % sodium chloride  infusion   IntraVENous PRN Markovitz, Christina L, APRN - CNP         Review of Systems:   Constitutional: No Fever or Weight Loss   Eyes: No Decreased Vision  ENT: No Headaches, Hearing Loss or Vertigo  Cardiovascular: As per HPI  Respiratory: As per HPI  Gastrointestinal: No abdominal pain, appetite loss, blood in stools, constipation, diarrhea or heartburn  Genitourinary: No dysuria, trouble voiding, or hematuria  Musculoskeletal:  No gait disturbance, weakness or joint complaints  Integumentary: No rash or pruritis  Neurological: No TIA or stroke symptoms  Psychiatric: No anxiety or depression  Endocrine: No malaise, fatigue or temperature intolerance  Hematologic/Lymphatic: No bleeding problems, blood clots or swollen lymph nodes  Allergic/Immunologic: No nasal congestion or hives  All systems negative except as marked.        Physical Examination:    Vitals:    10/10/24 0936 10/10/24 1007 10/10/24 1158 10/10/24 1233   BP: 130/87 123/86 121/83 (!) 136/96   Pulse:  (!) 103 (!) 104 (!) 109   Resp:  16 17 21    Temp:  97.1 F (36.2 C)  97.6 F (36.4 C)   TempSrc:  Temporal     SpO2:  100% 99% 96%   Weight:       Height:            General Appearance:  No distress, conversant  Constitutional:  Well developed, Well nourished, No acute distress, Non-toxic appearance.   HENT:  Normocephalic, Atraumatic, Bilateral external ears normal, Oropharynx moist, No oral exudates, Nose normal. Neck- Normal range of motion, No tenderness, Supple, No stridor,no apical-carotid delay  Lymphatics : no palpable lymph nodes  Eyes:  PERRL, EOMI, Conjunctiva normal, No discharge.   Respiratory:  Normal breath sounds, No respiratory distress, No wheezing, No chest tenderness.,no use of accessory muscles, crackles Absent   Cardiovascular: (PMI) apex non displaced,no lifts no thrills, ankle swelling Absent  , 1+, s1 and s2 audible,Murmur.Absent , JVD not noted    Abdomen /GI:  Bowel sounds normal, Soft, No tenderness, No masses, No gross visceromegaly   GU:  No costovertebral angle tenderness   Musculoskeletal:   Left leg covered with dressing. Back- no tenderness  Integument:  Well hydrated, no rash   Lymphatic:  No lymphadenopathy noted   Neurologic:  Alert & oriented x 3, CN 2-12 normal, normal motor function, normal sensory function, no focal deficits noted           Medical decision making and Data review:    Lab Review   Recent Labs     10/10/24  0908   WBC 10.8*   HGB 11.0*   HCT 31.9*   PLT 208      Recent Labs     10/10/24  0515   NA 132*   K 5.1   CL 100   CO2 19*   PHOS 4.7   BUN 62*   CREATININE 4.7*     Recent Labs     10/09/24  0435 10/10/24  0515   AST 28 33   ALT 19 17   BILIDIR <0.2  --    BILITOT 0.4 0.6   ALKPHOS 89 91     No results for input(s): TROPONINT in the last 72 hours.    No results for input(s): PROBNP in the last 72 hours.  Lab Results   Component Value Date    INR 1.3 10/09/2024    PROTIME 16.4 (H) 10/09/2024       EKG: (reviewed and interpreted by myself)    ECHO:(reviewed and interpreted by myself)    Chest Xray:(reviewed and interpreted by myself)  EGD  Result Date: 10/10/2024  [facility] Patient:  KANE, KUSEK MRN: [id number] DOB: [birth year] Sex at Birth: Male Age: 34 Years Procedure: Upper GI endoscopy Date: 10/10/2024 Attending Physician: Cinderella Keel Indications:        -  Melena        -  Acute post hemorrhagic anemia Medications:        -  See the Anesthesia note for documentation of the administered           medications Complications:        -  No immediate complications. Estimated Blood Loss:        -  Estimated blood loss was minimal. Procedure:        - The scope was introduced through the mouth and advanced to the           second part of the duodenum.        -  The upper GI endoscopy was accomplished without difficulty.        -  The patient tolerated the procedure well. Findings:        -  The examined esophagus was normal.        -  The entire examined  stomach was normal.        -  Localized moderate mucosal changes characterized by nodularity           were found in the duodenal bulb.  Biopsies were taken with a cold           forceps for histology.        -  The cardia and gastric fundus were normal on retroflexion.        - No active bleeding noted. Impression:        -  Normal esophagus.        -  Normal stomach.        -  Mucosal changes in the duodenum.  Biopsied.        - No active bleeding noted. Recommendation:        -  Patient has a contact number available for emergencies.  The           signs and symptoms of potential delayed complications were discussed           with the patient.  Return to normal activities tomorrow.  Written           discharge instructions were provided to the patient.        -  Await pathology results.        -  Telephone GI clinic for pathology results in 1 week.        -  Perform a colonoscopy tomorrow. Procedure Code(s):        - [id number], Esophagogastroduodenoscopy, flexible, transoral; with           biopsy, single or multiple Diagnosis Code(s):        - K92.1, Melena (includes Hematochezia)        - D62, Acute posthemorrhagic anemia        - K31.89, Other  diseases of stomach and duodenum CPT(R) - 2023 copyright [employer]. All Rights Reserved.       The CPT codes, CCI edits and ICD codes generated are intended as       suggestions and were generated based on input data.  These codes are       preliminary and upon coder review may be revised to meet current       compliance and payer requirements.  The provider is responsible for       the final determination of appropriate codes, and modifiers. Scope Withdrawal Time:       00:03:22 AHMED EDHI Ahmed Edhi This document has been electronically signed. Note Initiated:10/10/2024 Note Completed:10/10/2024 11:45 AM    CT ABDOMEN PELVIS WO CONTRAST Additional Contrast? None  Result Date: 10/09/2024  PROCEDURE: CT ABDOMEN PELVIS WO CONTRAST DATE OF EXAM:  10/08/2024 21:55 DEMOGRAPHICS: 63 years old Male INDICATION: Assess for hydronephrosis COMPARISON: No existing relevant imaging study corresponding to the same anatomical region is available. TECHNIQUE: Contiguous axial slices of the abdomen and pelvis were submitted without IV administration of contrast. No oral contrast was utilized. Additional  coronal reformatted images were submitted.  DOSE OPTIMIZATION: CT radiation dose optimization techniques (automated exposure  control, and use of iterative reconstruction techniques, or adjustment of the mA and/or kV according to patient size) were used to limit patient radiation dose. FINDINGS: CT ABDOMEN: Inferior chest:  There are large bilateral pleural effusions. There is bibasilar  consolidation. The heart size is normal without pericardial effusion. Gallbladder fossa:  No focal abnormalities are seen. Biliary tree: There is no evidence  for intra-or extrahepatic biliary ductal dilatation. Liver: The liver demonstrates normal appearance. No focal abnormalities are seen. Spleen: Normal size and morphology is seen. No masses are identified. Pancreas: Normal morphology without masses or inflammatory changes.  Adrenals: Normal size without masses. Kidneys: There is mild bilateral hydronephrosis. Vasculature: The abdominal aorta is normal in caliber. There is mild atherosclerotic disease of the abdominal aorta. Lymphatic system: No pathologically enlarged lymph nodes are seen. Bowel: The stomach is normally distended with no focal wall abnormality. The duodenum is normal in caliber along its course. No focal abnormality is seen. The small bowel is normal caliber. There is no focal stricture or dilatation. The colon is normal in caliber. No focal abnormality is seen. The appendix is not definitively visualized. Peritoneal structures: There is diffuse mesenteric and body wall edema. There is  trace ascites.  Retroperitoneum: No focal retroperitoneal abnormality is seen. Abdominal wall:  The visualized portions of the abdominal wall are within normal  limits. CT PELVIS: Urinary bladder: There is prominent distention of the urinary bladder. There is no focal bladder wall abnormality. Soft tissues: There is no pelvic adenopathy. The prostate is mildly enlarged. There is diffuse edema in the pelvic wall. Bones: No significant abnormalities in the bony pelvis. IMPRESSION: 1.  Mild bilateral hydronephrosis. Prominent distention of the urinary bladder. Findings could be related to urinary retention. Recommend clinical correlation and follow-up. 2.  Diffuse mesenteric and body wall edema, suggestive of anasarca. 3.  Large bilateral pleural effusions. This dictation was created with voice recognition software.  While attempts have  been made to review the dictation as it is transcribed, on occasion the spoken word can be misinterpreted by the technology leading to omissions or inappropriate words, phrases or sentences.  Dictated and Electronically Signed By: Iantha Bumpers, MD [facility] Reading Station:  [id number]  10/08/2024 22:36        US  PELVIS LIMITED  Result Date: 10/08/2024  US  PELVIS LIMITED  DATE OF SERVICE:   10/08/2024 17:57 CLINICAL INDICATION: bladder to verify retention as requested by urology team PATIENT INFORMATION: Bladder distention COMPARISON: None TECHNIQUE:  Real-time imaging of the urinary bladder FINDINGS:  Bladder contour is preserved. The bladder is distended. Estimated at 770 mL fluid. No filling defects or wall thickening. IMPRESSION: Distended urinary bladder.  Dictated and Electronically Signed By: Chyrl Deters, DO [facility] Reading Station:  [facility]  10/08/2024 20:47        Invasive vascular procedure  Addendum Date: 09/13/2024  Successful thrombectomy of left iliac artery left SFA left common femoral artery left brachial artery and successful angioplasty of left iliac artery, left SFA left popliteal and ATA     Result Date: 09/13/2024  Successful thrombectomy of left iliac artery left SFA left common femoral artery left brachial artery and successful angioplasty of left iliac artery, left SFA left popliteal and ATA     CTA LOWER EXTREMITY LEFT W CONTRAST  Result Date: 09/13/2024  CTA LOWER EXTREMITY LEFT W CONTRAST , 09/13/2024 4:36 HISTORY: Arterial occlusion COMPARISON: None CT radiation dose optimization techniques (automated exposure control, use of iterative reconstruction techniques, or adjustment of the mA and/or kV according  to patient size) were used to limit patient radiation dose. IOPAMIDOL  76 % IV SOLN 75mL Scout topogram: No additional finding 3-D CT angiography technique. Abundant contrast in the urinary bladder. This is from a recent procedure. Enlarged prostate. There is an intra-arterial catheter seen in the common iliac artery bilaterally which appears to terminate in the region  of left popliteal artery. Perhaps trace contrast demonstrated within common femoral artery. Otherwise no significant contrast opacification. Calcification of the popliteal artery. There is no runoff to the foot. Heterogeneous appearance of the musculature of the leg both in the thigh  and below the knee may be due to regions of ischemic muscle and myonecrosis. Low density within the pectineus muscle and adjacent groin musculature compatible with areas of necrosis. The bones show no significant abnormality IMPRESSION: 1.  Intra-arterial catheter. Occluded arteries to the left lower extremity. Occluded from common femoral artery distally. 2.  Heterogeneous musculature throughout the lower extremity compatible with ischemia and developing myonecrosis Report was called to ICU Darryll) and discussed with nurse Kristin by phone on 09/13/2024 5:01 . See above for complete and additional details.   Dictated and Electronically Signed By: Fairy Maffucci, MD [facility] 09/13/2024 5:04        Vascular duplex lower extremity arteries left  Result Date: 09/12/2024  VAS DUP LOWER EXTREMITY ARTERIES LEFT EXAM DATE: 09/12/2024 17:35 DEMOGRAPHICS: 63 years old Male INDICATION: Acute limb ischemia COMPARISON: No existing relevant imaging study corresponding to the same anatomical region is available. TECHNIQUE: Left lower extremity Doppler waveforms and velocities were obtained FINDINGS: Left lower extremity: Velocities (cm/s ) and Waveforms: Common femoral artery:  - 22 Monophasic. Profunda femoris:  Not identified. Superficial femoral:  - proximal : 44 The mid and distal segments appear occluded. Popliteal artery :  The popliteal artery appears occluded. Posterior tibial artery :  The posterior tibial artery appears occluded. Anterior tibial artery :  The anterior tibial artery appears occluded. IMPRESSION: Occluded left lower extremity arterial vasculature from the mid segment of the superficial femoral artery.  Dictated and Electronically Signed By: Glendia Shows, DO [facility] 09/12/2024 23:12    Findings were discussed with the patient's nurse Kristen on 09/12/2024 at 11:12 PM     Echo (TTE) complete (PRN contrast/bubble/strain/3D)  Result Date: 09/12/2024    Image quality is  poor, patient screaming in pain during exam.   Left Ventricle: Normal left ventricular systolic function with a visually estimated EF of 55 - 60%. Left ventricle is smaller than normal. Normal wall thickness.  LVIDd is 3.1 cm. Normal wall motion.   Left Atrium: Left atrium is smaller than normal.   Right Ventricle: Normal systolic function.   Aortic Valve: Individual aortic valve leaflets not well visualized.   Pericardium: No pericardial effusion.     Cardiac procedure  Result Date: 09/12/2024  Indication left lower extremity acute  limb ischemia Procedure performed: Abdominal aortogram Renal angiogram Bilateral lower extremities peripheral angiogram  Conscious sedation: IV Versed and IV fentanyl  were given, please refer to log for dosing EBL:  50 cc Description of procedure: After informed consent was obtained the patient was brought to cardiac Cath Lab in a fasting state. Bilateral groins were draped prepped and sterile and usual fashion. Right common femoral artery was accessed via ultrasound with 6 French sheath. On ultrasound the right common femoral artery was noted to be patent, needle was visualized accessing the artery. USG images were saved for medical records This was followed by insertion of pigtail catheter and J-wire. Catheter was then placed in abdominal aorta Distal aortogram was performed which revealed patent distal aorta bifurcating into left and right common iliac artery.  Renal arteries bilateral were noted be widely patent Anatomy: Left side: Left common iliac artery has 60 to 70% stenosis, left common femoral artery 20 to 80% stenosis, left SFA mid  segment 80% stenosis, possible vasospasm, popliteal arteries patent Left anterior tibial artery is occluded proximally Left posterior tibial artery is occluded mid segment No flow noted to the foot Limited angiogram on the right side Right common iliac external and femoral artery are noted to be patent Right patent proximal SFA and profunda  Peripheral intervention After reviewing images, It was decided to intervene Command wire was then introduced into the anterior tibial artery Penumbra lightening bolt 6 XT X system was used in the anterior deep, no aspirate was obtained This was followed by the use of penumbra in the posterior tibial artery as well however no aspiration could be obtained. Followed by introduction of tPA catheter into the mid SFA Patient will be heparinized alongside tPA infusion at 0.5 mg/h for next 18 to 24 hours Will obtain a follow-up angiogram in the morning Dr Bertell ++++++++++++++++++++++++++ Moderate (conscious) sedation was administered under the direct supervision of the performing physician. The patient received intravenous midazolam (Versed) and fentanyl  for sedation and analgesia. Continuous monitoring of heart rate, blood pressure, oxygen saturation, respiratory rate, and level of consciousness was maintained throughout the procedure by qualified personnel under the physicians supervision. Total sedation time: 60 minutes. The patient tolerated the procedure well without immediate complications.     Invasive vascular procedure  Result Date: 09/12/2024  Indication left lower extremity acute  limb ischemia Procedure performed: Abdominal aortogram Renal angiogram Bilateral lower extremities peripheral angiogram  Conscious sedation: IV Versed and IV fentanyl  were given, please refer to log for dosing EBL:  50 cc Description of procedure: After informed consent was obtained the patient was brought to cardiac Cath Lab in a fasting state. Bilateral groins were draped prepped and sterile and usual fashion. Right common femoral artery was accessed via ultrasound with 6 French sheath. On ultrasound the right common femoral artery was noted to be patent, needle was visualized accessing the artery. USG images were saved for medical records This was followed by insertion of pigtail catheter and J-wire. Catheter was then placed in  abdominal aorta Distal aortogram was performed which revealed patent distal aorta bifurcating into left and right common iliac artery.  Renal arteries bilateral were noted be widely patent Anatomy: Left side: Left common iliac artery has 60 to 70% stenosis, left common femoral artery 20 to 80% stenosis, left SFA mid segment 80% stenosis, possible vasospasm, popliteal arteries patent Left anterior tibial artery is occluded proximally Left posterior tibial artery is occluded mid segment No flow noted to the foot Limited angiogram on the right side Right common iliac external and femoral artery are noted to be patent Right patent proximal SFA and profunda Peripheral intervention After reviewing images, It was decided to intervene Command wire was then introduced into the anterior tibial artery Penumbra lightening bolt 6 XT X system was used in the anterior deep, no aspirate was obtained This was followed by the use of penumbra in the posterior tibial artery as well however no aspiration could be obtained. Followed by introduction of tPA catheter into the mid SFA Patient will be heparinized alongside tPA infusion at 0.5 mg/h for next 18 to 24 hours Will obtain a follow-up angiogram in the morning Dr Bertell ++++++++++++++++++++++++++ Moderate (conscious) sedation was administered under the direct supervision of the performing physician. The patient received intravenous midazolam (Versed) and fentanyl  for sedation and analgesia. Continuous monitoring of heart rate, blood pressure, oxygen saturation, respiratory rate, and level of consciousness was maintained throughout the procedure by qualified personnel under the physicians supervision.  Total sedation time: 60 minutes. The patient tolerated the procedure well without immediate complications.     XR CHEST PORTABLE  Result Date: 09/12/2024  PROCEDURE: XR CHEST PORTABLE DATE OF EXAM:  09/12/2024 9:53 DEMOGRAPHICS: 63 years old Male INDICATION: Altered Mental Status  COMPARISON: None FINDINGS:  Hyperinflation. No pleural effusion, pneumothorax, or focal consolidation. Cardiac size is within normal limits. No acute osseous abnormality. IMPRESSION:  Hyperinflation without acute airspace disease  Dictated and Electronically Signed By: Jorene Plough, MD [facility] 09/12/2024 9:24          All labs, medications and tests reviewed by myself including data  from outside source , patient and available family .  Continue all other medications of all above medical condition listed as is.     Impression:  Principal Problem:    Anemia due to GI blood loss  Active Problems:    Gastrointestinal hemorrhage  Resolved Problems:    * No resolved hospital problems. *      Assessment: 63 y.o.year old with PMH of  has a past medical history of Anemia due to GI blood loss, Hypertension, and Type 2 diabetes mellitus without complication, without long-term current use of insulin  (HCC).      Plan and Recommendations:    Agree with holding anticoagulation at this time due to acute GI bleed  Start aspirin  once possible and cleared  GI workup for possible source of bleeding  Transfuse to keep hemoglobin above 8  DVT prophylaxis if no contraindication  6.   Dyslipidemia: continue statins   Discussed with primary team, hospitalist service ,bedside staff ,nursing staff, patient and family        Thank you  much for consult and giving us  the opportunity in contributing in the care of this patient. Please feel free to call me for any questions.       Paulita Cosette Noble, MD, 10/10/2024 3:33 PM     The above note is prepared with the intention to serve as communication with trained medical care practitioners only. Some of the information is provided by secondary sources as well as from our patient and/or family member(s) recollection, thus inaccuracies may occur. In addition, other professionals integrate data into the electronic medical record, and the [facility] MD and NPs are not responsible  for these. Any errors will be corrected upon verification with documented reports.   "

## 2024-10-10 NOTE — Progress Notes (Deleted)
 "             [facility]             Cinderella Keel, MD      Gerri Schlatter, MD      Devera Blanch, APRN              Niels Ni, APRN-CNP             [address] [place], [place] [address]             [phone] fax [phone]    Gastroenterology Progress note 10/10/2024  Reason for consult: Anemia/GI bleed    Interval H/P  Presented with generalized weakness and dark-colored blood clots per rectum.  Patient on Eliquis for history of atrial fibrillation.  Status post recent thrombectomy.  Lab work on admission showed hemoglobin of 5.2.  No prior history of EGD, colonoscopy on 02/23/2024 with 5 mm polyp noted to be tubular adenoma.    Recent Labs     10/08/24  1304 10/09/24  0435 10/10/24  0515   AST 34 28 33   ALT 25 19 17    ALKPHOS 124 89 91   BILITOT 0.3 0.4 0.6   BILIDIR  --  <0.2  --      Lab Results   Component Value Date    WBC 10.4 10/10/2024    HGB 10.7 (L) 10/10/2024    HCT 32.1 (L) 10/10/2024    MCV 87.9 10/10/2024    PLT 249 10/09/2024    LYMPHOPCT 15 (L) 10/10/2024    RBC 3.65 (L) 10/10/2024    MCH 29.3 10/10/2024    MCHC 33.3 10/10/2024    RDW 14.5 10/10/2024     Physical Exam   Blood pressure 119/86, pulse (!) 101, temperature 97.7 F (36.5 C), temperature source Oral, resp. rate 14, height 1.753 m (5' 9), weight 50.8 kg (112 lb), SpO2 99%.  Constitutional: Patient is in no distress.   Eyes: Pupils equal, round.  No icterus.  HENT: Oral mucosa is moist.   Pulmonary: No accessory muscle use. No localizing pulmonary findings.     Cardiovascular: Heart has a regular rate and rhythm, no JVD.   Gastrointestinal: Bowel sounds are present in all four quadrants. Abdomen is soft, nontender, nondistended. Rectal examination deferred.   Skin: No jaundice, spider angiomas, or palmar erythema. No purpura.  Neuro: Awake,alert, and oriented x3. No gross focal neurologic deficits.    Chart and labs reviewed.    Impression/ Plan:    #1  Acute blood loss anemia  -  Presented with hemoglobin of 5.2 on admission with presentation of large black blood clots per rectum.  - Continue on Protonix  IV every 12 hours  - Continue to monitor H&H closely every 6 hours, transfuse if below 7.  - Currently hemoglobin 10.7  - Will need to plan for EGD evaluation, status post  - Currently n.p.o.    #2 history of tubular adenoma  - Noted on colonoscopy 02/23/2024  - Repeat colonoscopy in 7 years for surveillance.    Patient's history, exam, assessment and plan of care were discussed and decided in collaboration with Dr. Cinderella Keel, MD.     Thank you for allowing us  to be involved in the management of this patient. We will follow this patient along with you. Please feel free to call with any questions.      Devera Blanch, APRN - CNP  Gastroenterology  10/10/2024   9:15 AM    "

## 2024-10-10 NOTE — Anesthesia Postprocedure Evaluation (Signed)
"  Department of Anesthesiology  Postprocedure Note    Patient: Joe Avery  MRN: [id number]  Birthdate: 10-13-1961  Date of evaluation: 10/10/2024    Procedure Summary       Date: 10/10/24 Room / Location: [facility] ENDO 01 / [facility]    Anesthesia Start: 1126 Anesthesia Stop: 1139    Procedure: ESOPHAGOGASTRODUODENOSCOPY Diagnosis:       Anemia, unspecified type      Gastrointestinal hemorrhage, unspecified gastrointestinal hemorrhage type      (Anemia, unspecified type [D64.9])      (Gastrointestinal hemorrhage, unspecified gastrointestinal hemorrhage type [K92.2])    Surgeons: Madie Dine, MD Responsible Provider: Teddie Faden, MD    Anesthesia Type: MAC ASA Status: 3            Anesthesia Type: No value filed.    Aldrete Phase I:      Aldrete Phase II:      Anesthesia Post Evaluation    Patient location during evaluation: bedside  Patient participation: complete - patient participated  Level of consciousness: awake and alert  Airway patency: patent  Nausea & Vomiting: no nausea and no vomiting  Cardiovascular status: hemodynamically stable  Respiratory status: acceptable  Hydration status: stable  Pain management: adequate      No notable events documented.  "

## 2024-10-10 NOTE — Progress Notes (Addendum)
 "    [address]   [place], [place]  [address]   Progress Note  Loras Berber [id number]      Date: 10/10/2024   Patient: Joe Avery   DOB: 1961-10-13   DOA: 10/08/2024   MRN: [id number]   ROOM#: ENDO/NONE     Admit Date: 10/08/2024     Collaborating Urologist on Call at time of admission: Dr. Ulysess   CC: Melena   Reason for Consult: Urine retention, AKI     Subjective:     Pain: mild, no nausea and no vomiting  Bowel Movement/Flatus: Yes  Voiding: Indwelling catheter with cloudy yellow urine    Pt resting in bed, reports feeling OK today but does have some left leg pain.    Objective:    Vitals:   BP 123/86   Pulse (!) 103   Temp 97.1 F (36.2 C) (Temporal)   Resp 16   Ht 1.753 m (5' 9)   Wt 50.8 kg (112 lb)   SpO2 100%   BMI 16.54 kg/m   Temp  Avg: 98.3 F (36.8 C)  Min: 97.1 F (36.2 C)  Max: 99.5 F (37.5 C)    Intake/Output Summary (Last 24 hours) at 10/10/2024 1108  Last data filed at 10/10/2024 0941  Gross per 24 hour   Intake 1098.77 ml   Output 1250 ml   Net -151.23 ml       Physical Exam:   Gen: Pleasant male, in NAD  Neuro: Non-focal  Resp: Unlabored breathing  Abd: Soft, non-distended, non-tender to palpation, no rebound  Back:   No CVAT  GU:  Indwelling catheter with cloudy yellow urine  Ext: No edema of bilateral LEs    Labs:  WBC:    Lab Results   Component Value Date/Time    WBC 10.8 10/10/2024 09:08 AM     Hemoglobin/Hematocrit:    Lab Results   Component Value Date/Time    HGB 11.0 10/10/2024 09:08 AM    HCT 31.9 10/10/2024 09:08 AM     BMP:   Lab Results   Component Value Date/Time    NA 132 10/10/2024 05:15 AM    K 5.1 10/10/2024 05:15 AM    CL 100 10/10/2024 05:15 AM    CO2 19 10/10/2024 05:15 AM    BUN 62 10/10/2024 05:15 AM    LABALBU SPECIMEN QUANTITY NOT SUFFICIENT 06/11/2023 02:51 PM    CREATININE 4.7 10/10/2024 05:15 AM    CALCIUM  7.6 10/10/2024 05:15 AM    LABGLOM 13 10/10/2024 05:15 AM     Imaging:  CT ABDOMEN PELVIS WO CONTRAST Additional Contrast? None  Result Date:  10/09/2024  PROCEDURE: CT ABDOMEN PELVIS WO CONTRAST DATE OF EXAM:  10/08/2024 21:55 DEMOGRAPHICS: 63 years old Male INDICATION: Assess for hydronephrosis COMPARISON: No existing relevant imaging study corresponding to the same anatomical region is available. TECHNIQUE: Contiguous axial slices of the abdomen and pelvis were submitted without IV administration of contrast. No oral contrast was utilized. Additional  coronal reformatted images were submitted.  DOSE OPTIMIZATION: CT radiation dose optimization techniques (automated exposure  control, and use of iterative reconstruction techniques, or adjustment of the mA and/or kV according to patient size) were used to limit patient radiation dose. FINDINGS: CT ABDOMEN: Inferior chest:  There are large bilateral pleural effusions. There is bibasilar  consolidation. The heart size is normal without pericardial effusion. Gallbladder fossa:  No focal abnormalities are seen. Biliary tree: There is no evidence for intra-or extrahepatic biliary ductal  dilatation. Liver: The liver demonstrates normal appearance. No focal abnormalities are seen. Spleen: Normal size and morphology is seen. No masses are identified. Pancreas: Normal morphology without masses or inflammatory changes. Adrenals: Normal size without masses. Kidneys: There is mild bilateral hydronephrosis. Vasculature: The abdominal aorta is normal in caliber. There is mild atherosclerotic disease of the abdominal aorta. Lymphatic system: No pathologically enlarged lymph nodes are seen. Bowel: The stomach is normally distended with no focal wall abnormality. The duodenum is normal in caliber along its course. No focal abnormality is seen. The small bowel is normal caliber. There is no focal stricture or dilatation. The colon is normal in caliber. No focal abnormality is seen. The appendix is not definitively visualized. Peritoneal structures: There is diffuse mesenteric and body wall edema. There is  trace ascites.   Retroperitoneum: No focal retroperitoneal abnormality is seen. Abdominal wall:  The visualized portions of the abdominal wall are within normal  limits. CT PELVIS: Urinary bladder: There is prominent distention of the urinary bladder. There is no focal bladder wall abnormality. Soft tissues: There is no pelvic adenopathy. The prostate is mildly enlarged. There is diffuse edema in the pelvic wall. Bones: No significant abnormalities in the bony pelvis. IMPRESSION: 1.  Mild bilateral hydronephrosis. Prominent distention of the urinary bladder. Findings could be related to urinary retention. Recommend clinical correlation and follow-up. 2.  Diffuse mesenteric and body wall edema, suggestive of anasarca. 3.  Large bilateral pleural effusions. This dictation was created with voice recognition software.  While attempts have  been made to review the dictation as it is transcribed, on occasion the spoken word can be misinterpreted by the technology leading to omissions or inappropriate words, phrases or sentences.  Dictated and Electronically Signed By: Iantha Bumpers, MD [facility] Reading Station:  [id number]  10/08/2024 22:36        US  PELVIS LIMITED  Result Date: 10/08/2024  US  PELVIS LIMITED  DATE OF SERVICE:  10/08/2024 17:57 CLINICAL INDICATION: bladder to verify retention as requested by urology team PATIENT INFORMATION: Bladder distention COMPARISON: None TECHNIQUE:  Real-time imaging of the urinary bladder FINDINGS:  Bladder contour is preserved. The bladder is distended. Estimated at 770 mL fluid. No filling defects or wall thickening. IMPRESSION: Distended urinary bladder.  Dictated and Electronically Signed By: Chyrl Deters, DO [facility] Reading Station:  [facility]  10/08/2024 20:47        Invasive vascular procedure  Addendum Date: 09/13/2024  Successful thrombectomy of left iliac artery left SFA left common femoral artery left brachial artery and successful angioplasty of left  iliac artery, left SFA left popliteal and ATA     Result Date: 09/13/2024  Successful thrombectomy of left iliac artery left SFA left common femoral artery left brachial artery and successful angioplasty of left iliac artery, left SFA left popliteal and ATA     CTA LOWER EXTREMITY LEFT W CONTRAST  Result Date: 09/13/2024  CTA LOWER EXTREMITY LEFT W CONTRAST , 09/13/2024 4:36 HISTORY: Arterial occlusion COMPARISON: None CT radiation dose optimization techniques (automated exposure control, use of iterative reconstruction techniques, or adjustment of the mA and/or kV according  to patient size) were used to limit patient radiation dose. IOPAMIDOL  76 % IV SOLN 75mL Scout topogram: No additional finding 3-D CT angiography technique. Abundant contrast in the urinary bladder. This is from a recent procedure. Enlarged prostate. There is an intra-arterial catheter seen in the common iliac artery bilaterally which appears to terminate in the region of left popliteal artery. Perhaps  trace contrast demonstrated within common femoral artery. Otherwise no significant contrast opacification. Calcification of the popliteal artery. There is no runoff to the foot. Heterogeneous appearance of the musculature of the leg both in the thigh and below the knee may be due to regions of ischemic muscle and myonecrosis. Low density within the pectineus muscle and adjacent groin musculature compatible with areas of necrosis. The bones show no significant abnormality IMPRESSION: 1.  Intra-arterial catheter. Occluded arteries to the left lower extremity. Occluded from common femoral artery distally. 2.  Heterogeneous musculature throughout the lower extremity compatible with ischemia and developing myonecrosis Report was called to ICU Darryll) and discussed with nurse Kristin by phone on 09/13/2024 5:01 . See above for complete and additional details.   Dictated and Electronically Signed By: Fairy Maffucci, MD [facility] 09/13/2024  5:04        Vascular duplex lower extremity arteries left  Result Date: 09/12/2024  VAS DUP LOWER EXTREMITY ARTERIES LEFT EXAM DATE: 09/12/2024 17:35 DEMOGRAPHICS: 63 years old Male INDICATION: Acute limb ischemia COMPARISON: No existing relevant imaging study corresponding to the same anatomical region is available. TECHNIQUE: Left lower extremity Doppler waveforms and velocities were obtained FINDINGS: Left lower extremity: Velocities (cm/s ) and Waveforms: Common femoral artery:  - 22 Monophasic. Profunda femoris:  Not identified. Superficial femoral:  - proximal : 44 The mid and distal segments appear occluded. Popliteal artery :  The popliteal artery appears occluded. Posterior tibial artery :  The posterior tibial artery appears occluded. Anterior tibial artery :  The anterior tibial artery appears occluded. IMPRESSION: Occluded left lower extremity arterial vasculature from the mid segment of the superficial femoral artery.  Dictated and Electronically Signed By: Glendia Shows, DO [facility] 09/12/2024 23:12    Findings were discussed with the patient's nurse Kristen on 09/12/2024 at 11:12 PM     Echo (TTE) complete (PRN contrast/bubble/strain/3D)  Result Date: 09/12/2024    Image quality is poor, patient screaming in pain during exam.   Left Ventricle: Normal left ventricular systolic function with a visually estimated EF of 55 - 60%. Left ventricle is smaller than normal. Normal wall thickness.  LVIDd is 3.1 cm. Normal wall motion.   Left Atrium: Left atrium is smaller than normal.   Right Ventricle: Normal systolic function.   Aortic Valve: Individual aortic valve leaflets not well visualized.   Pericardium: No pericardial effusion.     Cardiac procedure  Result Date: 09/12/2024  Indication left lower extremity acute  limb ischemia Procedure performed: Abdominal aortogram Renal angiogram Bilateral lower extremities peripheral angiogram  Conscious sedation: IV Versed and IV fentanyl  were  given, please refer to log for dosing EBL:  50 cc Description of procedure: After informed consent was obtained the patient was brought to cardiac Cath Lab in a fasting state. Bilateral groins were draped prepped and sterile and usual fashion. Right common femoral artery was accessed via ultrasound with 6 French sheath. On ultrasound the right common femoral artery was noted to be patent, needle was visualized accessing the artery. USG images were saved for medical records This was followed by insertion of pigtail catheter and J-wire. Catheter was then placed in abdominal aorta Distal aortogram was performed which revealed patent distal aorta bifurcating into left and right common iliac artery.  Renal arteries bilateral were noted be widely patent Anatomy: Left side: Left common iliac artery has 60 to 70% stenosis, left common femoral artery 20 to 80% stenosis, left SFA mid segment 80% stenosis, possible vasospasm,  popliteal arteries patent Left anterior tibial artery is occluded proximally Left posterior tibial artery is occluded mid segment No flow noted to the foot Limited angiogram on the right side Right common iliac external and femoral artery are noted to be patent Right patent proximal SFA and profunda Peripheral intervention After reviewing images, It was decided to intervene Command wire was then introduced into the anterior tibial artery Penumbra lightening bolt 6 XT X system was used in the anterior deep, no aspirate was obtained This was followed by the use of penumbra in the posterior tibial artery as well however no aspiration could be obtained. Followed by introduction of tPA catheter into the mid SFA Patient will be heparinized alongside tPA infusion at 0.5 mg/h for next 18 to 24 hours Will obtain a follow-up angiogram in the morning Dr Bertell ++++++++++++++++++++++++++ Moderate (conscious) sedation was administered under the direct supervision of the performing physician. The patient received  intravenous midazolam (Versed) and fentanyl  for sedation and analgesia. Continuous monitoring of heart rate, blood pressure, oxygen saturation, respiratory rate, and level of consciousness was maintained throughout the procedure by qualified personnel under the physicians supervision. Total sedation time: 60 minutes. The patient tolerated the procedure well without immediate complications.     Invasive vascular procedure  Result Date: 09/12/2024  Indication left lower extremity acute  limb ischemia Procedure performed: Abdominal aortogram Renal angiogram Bilateral lower extremities peripheral angiogram  Conscious sedation: IV Versed and IV fentanyl  were given, please refer to log for dosing EBL:  50 cc Description of procedure: After informed consent was obtained the patient was brought to cardiac Cath Lab in a fasting state. Bilateral groins were draped prepped and sterile and usual fashion. Right common femoral artery was accessed via ultrasound with 6 French sheath. On ultrasound the right common femoral artery was noted to be patent, needle was visualized accessing the artery. USG images were saved for medical records This was followed by insertion of pigtail catheter and J-wire. Catheter was then placed in abdominal aorta Distal aortogram was performed which revealed patent distal aorta bifurcating into left and right common iliac artery.  Renal arteries bilateral were noted be widely patent Anatomy: Left side: Left common iliac artery has 60 to 70% stenosis, left common femoral artery 20 to 80% stenosis, left SFA mid segment 80% stenosis, possible vasospasm, popliteal arteries patent Left anterior tibial artery is occluded proximally Left posterior tibial artery is occluded mid segment No flow noted to the foot Limited angiogram on the right side Right common iliac external and femoral artery are noted to be patent Right patent proximal SFA and profunda Peripheral intervention After reviewing images, It was  decided to intervene Command wire was then introduced into the anterior tibial artery Penumbra lightening bolt 6 XT X system was used in the anterior deep, no aspirate was obtained This was followed by the use of penumbra in the posterior tibial artery as well however no aspiration could be obtained. Followed by introduction of tPA catheter into the mid SFA Patient will be heparinized alongside tPA infusion at 0.5 mg/h for next 18 to 24 hours Will obtain a follow-up angiogram in the morning Dr Bertell ++++++++++++++++++++++++++ Moderate (conscious) sedation was administered under the direct supervision of the performing physician. The patient received intravenous midazolam (Versed) and fentanyl  for sedation and analgesia. Continuous monitoring of heart rate, blood pressure, oxygen saturation, respiratory rate, and level of consciousness was maintained throughout the procedure by qualified personnel under the physicians supervision. Total sedation time: 60 minutes.  The patient tolerated the procedure well without immediate complications.     XR CHEST PORTABLE  Result Date: 09/12/2024  PROCEDURE: XR CHEST PORTABLE DATE OF EXAM:  09/12/2024 9:53 DEMOGRAPHICS: 63 years old Male INDICATION: Altered Mental Status COMPARISON: None FINDINGS:  Hyperinflation. No pleural effusion, pneumothorax, or focal consolidation. Cardiac size is within normal limits. No acute osseous abnormality. IMPRESSION:  Hyperinflation without acute airspace disease  Dictated and Electronically Signed By: Jorene Plough, MD [facility] 09/12/2024 9:24          Assessment & Plan:      Stacy Deshler is a 63 y.o. 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     1) Urine retention: 68F Foley placed at bedside returning ~1L of amber urine.  Maintain Foley at this time, at least until renal function normalizes and no longer needing diuresis.  May require suprapubic catheter placement for future management but OK to hold off on IR consult at this time.     2) AKI on CKD: With recent history of acute renal failure with oliguria requiring dialysis.                Creatinine 4.7.  Nephrology is following.    Will follow peripherally.  Patient seen and examined, chart reviewed.   Electronically signed by Velma JONETTA Ryder, PA-C on 10/10/2024 at 11:08 AM  "

## 2024-10-10 NOTE — Progress Notes (Signed)
"  Patient drank only 8oz of bowel prep so far. He states it is making him nauseous and does not like the taste. Dr. Madie notified and instructed to give zofran  4mg  once and continue encouraging overnight.   "

## 2024-10-10 NOTE — Anesthesia Pre Procedure (Signed)
 "Department of Anesthesiology  Preprocedure Note       Name:  Joe Avery   Age:  63 y.o.  DOB:  Jan 23, 1961                                          MRN:  [id number]         Date:  10/10/2024      Surgeon: Clotilde):  Madie Dine, MD    Procedure: Procedure(s):  ESOPHAGOGASTRODUODENOSCOPY    Medications prior to admission:   Prior to Admission medications   Medication Sig Start Date End Date Taking? Authorizing Provider   Glucagon  3 MG/DOSE POWD 3 mg by Nasal route as needed (for low blood sugar)   Yes [provider]   carvedilol (COREG) 6.25 MG tablet Take 1 tablet by mouth 2 times daily (with meals)   Yes [provider]   apixaban (ELIQUIS) 5 MG TABS tablet Take 1 tablet by mouth 2 times daily   Yes [provider]   amitriptyline (ELAVIL) 10 MG tablet Take 1 tablet by mouth 2 times daily   Yes [provider]   mirtazapine (REMERON) 15 MG tablet Take 0.5 tablets by mouth nightly   Yes [provider]   oxyCODONE (ROXICODONE) 5 MG immediate release tablet Take 1 tablet by mouth every 8 hours as needed for Pain. Max Daily Amount: 15 mg   Yes [provider]   polyethylene glycol (GLYCOLAX ) 17 g packet Take 1 packet by mouth daily   Yes [provider]   aspirin  81 MG chewable tablet Take 1 tablet by mouth daily 12/14/23  Yes D'Entremont, Mabel DASEN, MD   gabapentin  (NEURONTIN ) 100 MG capsule Take 1 capsule by mouth in the morning and at bedtime. 08/06/23 10/09/24 Yes Fernande Alfonso Sahara, APRN - CNP   amLODIPine  (NORVASC ) 10 MG tablet Take 1 tablet by mouth daily 02/02/23 10/09/24 Yes [provider]   atorvastatin  (LIPITOR ) 40 MG tablet Take 1 tablet by mouth nightly  Patient not taking: Reported on 10/09/2024 12/13/23   D'Entremont, Mabel DASEN, MD   metFORMIN (GLUCOPHAGE) 500 MG tablet Take 1 tablet by mouth daily (with breakfast)  Patient not taking: Reported on 10/09/2024 08/12/23   [provider]   raNITIdine HCl (RANITIDINE 150  MAX STRENGTH PO) Take by mouth as needed  Patient not taking: Reported on 10/09/2024    [provider]   Aspirin -Acetaminophen -Caffeine (EXCEDRIN PO) Take by mouth as needed  Patient not taking: Reported on 10/09/2024    [provider]   lisinopril  (PRINIVIL ;ZESTRIL ) 40 MG tablet Take 1 tablet by mouth daily 02/02/23 05/18/24  [provider]       Current medications:    Current Facility-Administered Medications   Medication Dose Route Frequency Provider Last Rate Last Admin    furosemide  (LASIX ) injection 80 mg  80 mg IntraVENous BID Laroy Gatha Pulling, MD   80 mg at 10/10/24 0936    0.9 % sodium chloride  infusion   IntraVENous PRN Fernand Deatrice LABOR, MD        0.9 % sodium chloride  infusion   IntraVENous PRN Author Head, MD        [facility] by provider] aspirin  chewable tablet 81 mg  81 mg Oral Daily Author Head, MD        [Held by provider] atorvastatin  (LIPITOR ) tablet 40 mg  40 mg  Oral Nightly Kunwar, Kabir, MD        glucose chewable tablet 16 g  4 tablet Oral PRN Author Head, MD        dextrose  bolus 10% 125 mL  125 mL IntraVENous PRN Author Head, MD        Or    dextrose  bolus 10% 250 mL  250 mL IntraVENous PRN Author Head, MD        glucagon  injection 1 mg  1 mg SubCUTAneous PRN Author Head, MD        dextrose  10 % infusion   IntraVENous Continuous PRN Author Head, MD        insulin  lispro (HUMALOG ,ADMELOG ) injection vial 0-4 Units  0-4 Units SubCUTAneous 4x Daily AC & HS Author Head, MD   1 Units at 10/09/24 2131    pantoprazole  (PROTONIX ) injection 40 mg  40 mg IntraVENous BID Author Head, MD   40 mg at 10/10/24 9065    sodium chloride  flush 0.9 % injection 5-40 mL  5-40 mL IntraVENous 2 times per day Author Head, MD   10 mL at 10/10/24 0940    sodium chloride  flush 0.9 % injection 5-40 mL  5-40 mL IntraVENous PRN Author Head, MD        0.9 % sodium chloride  infusion   IntraVENous PRN Author Head, MD        ondansetron  (ZOFRAN -ODT)  disintegrating tablet 4 mg  4 mg Oral Q8H PRN Author Head, MD        Or    ondansetron  (ZOFRAN ) injection 4 mg  4 mg IntraVENous Q6H PRN Author Head, MD        acetaminophen  (TYLENOL ) tablet 650 mg  650 mg Oral Q6H PRN Author Head, MD   650 mg at 10/09/24 9162    Or    acetaminophen  (TYLENOL ) suppository 650 mg  650 mg Rectal Q6H PRN Author Head, MD        0.9 % sodium chloride  infusion   IntraVENous PRN Markovitz, Christina L, APRN - CNP           Allergies:  No Known Allergies    Problem List:    Patient Active Problem List   Diagnosis Code    Chronic kidney disease, stage III (moderate) (HCC) N18.30    Primary hypertension I10    Low vitamin D  level R79.89    Type 2 diabetes mellitus without complication, without long-term current use of insulin  (HCC) E11.9    Persistent proteinuria R80.1    Stroke-like symptoms R29.90    Primary insomnia F51.01    Chronic kidney disease, 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    Acute lower limb ischemia I99.8    Critical limb ischemia of left lower extremity (HCC) I70.222    Anemia due to GI blood loss D50.0       Past Medical History:        Diagnosis Date    Anemia due to GI blood loss 10/08/2024    Hypertension     Type 2 diabetes mellitus without complication, without long-term current use of insulin  (HCC) 08/11/2023       Past Surgical History:        Procedure Laterality Date    CARDIAC PROCEDURE N/A 09/12/2024    Peripheral angiography performed by Bertell Clarine Nurse, MD at [facility] CARDIAC CATH LAB    COLONOSCOPY N/A 02/23/2024    COLONOSCOPY POLYPECTOMY SNARE/BIOPSY performed by Madie Dine, MD at [facility] ENDOSCOPY    INVASIVE VASCULAR N/A 09/12/2024  Thrombectomy peripheral artery performed by Bertell Clarine Nurse, MD at [facility] CARDIAC CATH LAB    INVASIVE VASCULAR N/A 09/13/2024    Thrombolysis peripheral artery performed by Merrianne Deatrice GORMAN DELENA, MD at [facility] CARDIAC CATH LAB       Social History:    Social History     Tobacco Use    Smoking status: Every Day     Current  packs/day: 0.10     Average packs/day: 0.5 packs/day for 44.9 years (21.7 ttl pk-yrs)     Types: Cigarettes     Start date: 29    Smokeless tobacco: Never    Tobacco comments:     Down to 3 per day   Substance Use Topics    Alcohol use: Never                                Ready to quit: Not Answered  Counseling given: Not Answered  Tobacco comments: Down to 3 per day      Vital Signs (Current):   Vitals:    10/10/24 0448 10/10/24 0800 10/10/24 0936 10/10/24 1007   BP: (!) 135/90 119/86 130/87 123/86   Pulse: (!) 115 (!) 101  (!) 103   Resp: 19 14  16    Temp: 98.3 F (36.8 C) 97.7 F (36.5 C)  97.1 F (36.2 C)   TempSrc: Oral Oral  Temporal   SpO2: 100% 99%  100%   Weight:       Height:                                                  BP Readings from Last 3 Encounters:   10/10/24 123/86   09/13/24 101/70   05/18/24 (!) 142/70       NPO Status: Time of last liquid consumption: 0000                        Time of last solid consumption: 0000                        Date of last liquid consumption: 10/09/24                        Date of last solid food consumption: 10/09/24    BMI:   Wt Readings from Last 3 Encounters:   10/08/24 50.8 kg (112 lb)   09/13/24 50.9 kg (112 lb 3.4 oz)   05/18/24 55.2 kg (121 lb 9.6 oz)     Body mass index is 16.54 kg/m.    CBC:   Lab Results   Component Value Date/Time    WBC 10.8 10/10/2024 09:08 AM    RBC 3.72 10/10/2024 09:08 AM    HGB 11.0 10/10/2024 09:08 AM    HCT 31.9 10/10/2024 09:08 AM    MCV 85.8 10/10/2024 09:08 AM    RDW 14.4 10/10/2024 09:08 AM    PLT 208 10/10/2024 09:08 AM       CMP:   Lab Results   Component Value Date/Time    NA 132 10/10/2024 05:15 AM    K 5.1 10/10/2024 05:15 AM    CL 100 10/10/2024 05:15 AM    CO2 19 10/10/2024 05:15  AM    BUN 62 10/10/2024 05:15 AM    CREATININE 4.7 10/10/2024 05:15 AM    LABGLOM 13 10/10/2024 05:15 AM    GLUCOSE 153 10/10/2024 05:15 AM    CALCIUM  7.6 10/10/2024 05:15 AM    BILITOT 0.6 10/10/2024 05:15 AM    ALKPHOS 91  10/10/2024 05:15 AM    AST 33 10/10/2024 05:15 AM    ALT 17 10/10/2024 05:15 AM       POC Tests:   Recent Labs     10/10/24  0731   POCGLU 160*       Coags:   Lab Results   Component Value Date/Time    PROTIME 16.4 10/09/2024 01:45 PM    INR 1.3 10/09/2024 01:45 PM    APTT >240.0 09/13/2024 01:42 PM       HCG (If Applicable): No results found for: PREGTESTUR, PREGSERUM, HCG, HCGQUANT     ABGs: No results found for: PHART, PO2ART, PCO2ART, HCO3ART, BEART, O2SATART     Type & Screen (If Applicable):  Lab Results   Component Value Date    ABORH A NEGATIVE 10/08/2024    LABANTI NEGATIVE 10/08/2024       Drug/Infectious Status (If Applicable):  No results found for: HIV, HEPCAB    COVID-19 Screening (If Applicable): No results found for: COVID19        Anesthesia Evaluation    Patient summary reviewed      Airway:  Mallampati: II  TM distance: >3 FB   Neck ROM: full    Mouth opening: > = 3 FB   Dental:  normal exam         Pulmonary:   Negative Pulmonary ROS and normal exam              Cardiovascular:       (+)     hypertension:                      hyperlipidemia                      Rhythm: regular  Rate: normal                Neuro/Psych:    Negative Neuro/Psych ROS             GI/Hepatic/Renal:    (+)         renal disease: CRI          Endo/Other:     (+) Diabetes: Type II DM                       Abdominal:  normal exam            Vascular:  negative vascular ROS.       Other Findings:            Anesthesia Plan      MAC     ASA 3       Induction: intravenous.      Anesthetic plan and risks discussed with patient.      Plan discussed with CRNA.    Attending anesthesiologist reviewed and agrees with Preprocedure content              Curtistine Gals, MD   10/10/2024            "

## 2024-10-10 NOTE — Plan of Care (Signed)
"    Problem: Chronic Conditions and Co-morbidities  Goal: Patient's chronic conditions and co-morbidity symptoms are monitored and maintained or improved  Outcome: Progressing     Problem: Discharge Planning  Goal: Discharge to home or other facility with appropriate resources  Outcome: Progressing     Problem: Pain  Goal: Verbalizes/displays adequate comfort level or baseline comfort level  Outcome: Progressing     Problem: Skin/Tissue Integrity  Goal: Skin integrity remains intact  Description: 1.  Monitor for areas of redness and/or skin breakdown  2.  Assess vascular access sites hourly  3.  Every 4-6 hours minimum:  Change oxygen saturation probe site  4.  Every 4-6 hours:  If on nasal continuous positive airway pressure, respiratory therapy assess nares and determine need for appliance change or resting period  Outcome: Progressing     "

## 2024-10-10 NOTE — Progress Notes (Signed)
"  Called lab to see about CBC results- looking for HGB level. Per lab order was canceled r/t pt receiving blood at that time. Stat order placed to get drawn.   "

## 2024-10-10 NOTE — Progress Notes (Signed)
 "    V2.0  [facility] Hospitalist Progress Note      Name:  Joe Avery DOB/Age/Sex: October 02, 1961  (63 y.o. male)   MRN & CSN:  [id number] & [id number] Encounter Date/Time: 10/10/2024 1:19 PM EST    Location:  3127/3127-A PCP: Fernand Ruffing, MD       Hospital Day: 3    Assessment and Plan:   Joe Avery is a 63 y.o. male with pmh of  who presents with Anemia due to GI blood loss      Plan:    Anemia  Melena  -Presenting complaint-melanotic stool noted in the facility  - Hemoglobin was found to be 5.2 g/dL in the ED, patient pale and lethargic  - Patient is on Eliquis-hold Eliquis for now  - S/p 2 unit PRBC transfusion on admission  -10/10/2024 overnight, patient had large black tarry bowel movement per bedside nursing.  Hemoglobin had dropped to 4.8 G/DL.  3 units PRBC transfused overnight.  This a.m. hemoglobin 10.7 G/DL  -Underwent EGD in the a.m.--with no bleeding source found  -Continue clear liquid diet for now.  Plan for colonoscopy tomorrow.     AKI on hemodialysis  Anasarca  -Recent admission was complicated by AKI on ATN in the setting of rhabdomyolysis and shock, needed to be started on hemodialysis, tunneled dialysis catheter was placed on 09/27/2024, patient continued to remain oliguric and hemodialysis was continued.  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  S/p left AKA  -Patient was initially admitted to [facility] on 09/12/2024 with severe left lower extremity pain, found to have critical limb ischemia, started on heparin  drip, emergent angiogram showed complete occlusion of left SFA, catheter directed thrombolysis with tPA, followed by repeat angiogram with  successful mechanical thrombectomy, unfortunately developed compartment syndrome with myonecrosis, nonpalpable and nondopplerable pulses with cold and mottled left leg, transferred to [facility] for vascular surgery evaluation and possible intervention.  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  Eventually plan to start on aspirin  once hemoglobin stable      Type 2 diabetes mellitus  - Patient was discharged on sliding scale as his glucose was well-controlled due to poor oral intake  - Continue on low-dose sliding scale          Diet Diet NPO Exceptions are: Sips of Water  with Meds  ADULT DIET; Clear Liquid   DVT Prophylaxis []  Lovenox , []   Heparin , []  SCDs, []  Ambulation,  []  Eliquis, []  Xarelto  []  Coumadin   Code Status Full Code   Disposition From:   Expected Disposition:   Estimated Date of Discharge:   Patient requires continued admission due to    Surrogate Decision Maker/ POA      Personally reviewed Lab Studies and Imaging         Subjective:     Chief Complaint: Rectal Bleeding  Joe Avery is a 63 y.o. male who presents with low hemoglobin.    Seen and examined in the morning.  Appears quite lethargic.  Pale looking, ill looking .  Per bedside nursing, had black tarry stool overnight. Got 3U PRBCs transfused.          Review of Systems:    Review of Systems  Negative if not mentioned above      Objective:     Intake/Output Summary (Last 24 hours) at 10/10/2024 1556  Last data filed at 10/10/2024 1139  Gross per 24 hour   Intake 1398.77 ml   Output 1250 ml   Net 148.77 ml        Vitals:   Vitals:    10/10/24 1233   BP: (!) 136/96   Pulse: (!) 109   Resp: 21   Temp: 97.6 F (36.4 C)   SpO2: 96%        Physical Exam:     General: NAD  Eyes: EOMI  ENT: neck supple  Cardiovascular: Regular rate.  Respiratory: Clear to auscultation  Gastrointestinal: Soft, non tender  Genitourinary: no suprapubic tenderness  Musculoskeletal: Left AKA, sutures intact, wound dry  Skin: warm, dry  Neuro: Alert, lethargic, follows commands   Psych: Mood appropriate.     Medications:   Medications:    furosemide   80 mg IntraVENous BID    polyethylene glycol  4,000 mL Oral Once    [Held by provider] aspirin   81 mg Oral Daily    [Held by provider] atorvastatin   40 mg Oral Nightly    insulin  lispro  0-4 Units SubCUTAneous 4x Daily AC & HS    pantoprazole   40 mg IntraVENous BID    sodium chloride  flush  5-40 mL IntraVENous 2 times per day      Infusions:    sodium chloride       dextrose       sodium chloride       sodium chloride        PRN Meds: sodium chloride , , PRN  glucose, 4 tablet, PRN  dextrose  bolus, 125 mL, PRN   Or  dextrose  bolus, 250 mL, PRN  glucagon , 1 mg, PRN  dextrose , , Continuous PRN  sodium chloride  flush, 5-40 mL, PRN  sodium chloride , , PRN  ondansetron , 4 mg, Q8H PRN   Or  ondansetron , 4 mg, Q6H PRN  acetaminophen , 650 mg, Q6H PRN   Or  acetaminophen , 650 mg, Q6H PRN  sodium chloride , , PRN        Labs      Recent Results (from the past 24 hours)   POCT Glucose    Collection Time: 10/09/24  3:58 PM   Result Value Ref Range    POC Glucose 224 (H) 74 - 99 mg/dL   Hemoglobin and Hematocrit    Collection Time: 10/09/24  8:51 PM   Result Value Ref Range    Hemoglobin 4.8 (LL) 13.5 - 18.0 g/dL    Hematocrit 85.4 (L) 42.0 - 52.0 %   POCT Glucose    Collection Time: 10/09/24  8:58 PM   Result Value Ref Range    POC Glucose 192 (H) 74 - 99 mg/dL   Magnesium     Collection Time: 10/10/24  5:15 AM   Result Value Ref Range    Magnesium  1.9 1.8 - 2.4 mg/dL   Phosphorus    Collection Time: 10/10/24  5:15 AM   Result Value Ref Range    Phosphorus 4.7 2.5 - 4.9  mg/dL   CBC with Auto Differential    Collection Time: 10/10/24  5:15 AM    Result Value Ref Range    WBC 10.4 4.0 - 10.5 k/uL    RBC 3.65 (L) 4.60 - 6.20 m/uL    Hemoglobin 10.7 (L) 13.5 - 18.0 g/dL    Hematocrit 67.8 (L) 42.0 - 52.0 %    MCV 87.9 78.0 - 100.0 fL    MCH 29.3 27.0 - 31.0 pg    MCHC 33.3 32.0 - 36.0 g/dL    RDW 85.4 88.2 - 85.0 %    Neutrophils % 71 (H) 36 - 66 %    Lymphocytes % 15 (L) 24 - 44 %    Monocytes % 9 (H) 0 - 5 %    Eosinophils % 4 (H) 0.0 - 3.0 %    Basophils % 1 0 - 1 %    Immature Granulocytes % 1 (H) 0 %    Neutrophils Absolute 7.34 k/uL    Lymphocytes Absolute 1.52 k/uL    Monocytes Absolute 0.93 k/uL    Eosinophils Absolute 0.39 k/uL    Basophils Absolute 0.13 k/uL    Immature Granulocytes Absolute 0.11 k/uL   Comprehensive Metabolic Panel    Collection Time: 10/10/24  5:15 AM   Result Value Ref Range    Sodium 132 (L) 136 - 145 mmol/L    Potassium 5.1 3.5 - 5.1 mmol/L    Chloride 100 99 - 110 mmol/L    CO2 19 (L) 21 - 32 mmol/L    Anion Gap 12 9 - 17 mmol/L    Glucose 153 (H) 74 - 99 mg/dL    BUN 62 (H) 7 - 20 mg/dL    Creatinine 4.7 (H) 0.8 - 1.3 mg/dL    Est, Glom Filt Rate 13 (L) >60 mL/min/1.64m2    Calcium  7.6 (L) 8.3 - 10.6 mg/dL    Total Protein 4.2 (L) 6.4 - 8.2 g/dL    Albumin 1.9 (L) 3.4 - 5.0 g/dL    Albumin/Globulin Ratio 0.9 (L) 1.1 - 2.2    Total Bilirubin 0.6 0.0 - 1.0 mg/dL    Alkaline Phosphatase 91 40 - 129 U/L    ALT 17 10 - 40 U/L    AST 33 15 - 37 U/L   CK    Collection Time: 10/10/24  5:15 AM   Result Value Ref Range    Total CK 141 26 - 192 U/L   Platelet Confirmation    Collection Time: 10/10/24  5:15 AM   Result Value Ref Range    Platelet Confirmation Adequate Platelets    POCT Glucose    Collection Time: 10/10/24  7:31 AM   Result Value Ref Range    POC Glucose 160 (H) 74 - 99 mg/dL   CBC with Diff    Collection Time: 10/10/24  9:08 AM   Result Value Ref Range    WBC 10.8 (H) 4.0 - 10.5 k/uL    RBC 3.72 (L) 4.60 - 6.20 m/uL    Hemoglobin 11.0 (L) 13.5 - 18.0 g/dL    Hematocrit 68.0 (L) 42.0 - 52.0 %    MCV 85.8 78.0 - 100.0 fL     MCH 29.6 27.0 - 31.0 pg    MCHC 34.5 32.0 - 36.0 g/dL    RDW 85.5 88.2 - 85.0 %    Platelets 208 140 - 440 k/uL    MPV 10.5 7.5 - 11.1 fL    Neutrophils % 74 (H)  36 - 66 %    Lymphocytes % 14 (L) 24 - 44 %    Monocytes % 8 (H) 0 - 5 %    Eosinophils % 3 0.0 - 3.0 %    Basophils % 1 0 - 1 %    Immature Granulocytes % 1 (H) 0 %    Neutrophils Absolute 7.92 k/uL    Lymphocytes Absolute 1.51 k/uL    Monocytes Absolute 0.84 k/uL    Eosinophils Absolute 0.30 k/uL    Basophils Absolute 0.11 k/uL    Immature Granulocytes Absolute 0.10 k/uL   POCT Glucose    Collection Time: 10/10/24 12:09 PM   Result Value Ref Range    POC Glucose 121 (H) 74 - 99 mg/dL        Imaging/Diagnostics Last 24 Hours   CT ABDOMEN PELVIS WO CONTRAST Additional Contrast? None  Result Date: 10/09/2024  PROCEDURE: CT ABDOMEN PELVIS WO CONTRAST DATE OF EXAM:  10/08/2024 21:55 DEMOGRAPHICS: 63 years old Male INDICATION: Assess for hydronephrosis COMPARISON: No existing relevant imaging study corresponding to the same anatomical region is available. TECHNIQUE: Contiguous axial slices of the abdomen and pelvis were submitted without IV administration of contrast. No oral contrast was utilized. Additional  coronal reformatted images were submitted.  DOSE OPTIMIZATION: CT radiation dose optimization techniques (automated exposure  control, and use of iterative reconstruction techniques, or adjustment of the mA and/or kV according to patient size) were used to limit patient radiation dose. FINDINGS: CT ABDOMEN: Inferior chest:  There are large bilateral pleural effusions. There is bibasilar  consolidation. The heart size is normal without pericardial effusion. Gallbladder fossa:  No focal abnormalities are seen. Biliary tree: There is no evidence for intra-or extrahepatic biliary ductal dilatation. Liver: The liver demonstrates normal appearance. No focal abnormalities are seen. Spleen: Normal size and morphology is seen. No masses are identified.  Pancreas: Normal morphology without masses or inflammatory changes. Adrenals: Normal size without masses. Kidneys: There is mild bilateral hydronephrosis. Vasculature: The abdominal aorta is normal in caliber. There is mild atherosclerotic disease of the abdominal aorta. Lymphatic system: No pathologically enlarged lymph nodes are seen. Bowel: The stomach is normally distended with no focal wall abnormality. The duodenum is normal in caliber along its course. No focal abnormality is seen. The small bowel is normal caliber. There is no focal stricture or dilatation. The colon is normal in caliber. No focal abnormality is seen. The appendix is not definitively visualized. Peritoneal structures: There is diffuse mesenteric and body wall edema. There is  trace ascites.  Retroperitoneum: No focal retroperitoneal abnormality is seen. Abdominal wall:  The visualized portions of the abdominal wall are within normal  limits. CT PELVIS: Urinary bladder: There is prominent distention of the urinary bladder. There is no focal bladder wall abnormality. Soft tissues: There is no pelvic adenopathy. The prostate is mildly enlarged. There is diffuse edema in the pelvic wall. Bones: No significant abnormalities in the bony pelvis. IMPRESSION: 1.  Mild bilateral hydronephrosis. Prominent distention of the urinary bladder. Findings could be related to urinary retention. Recommend clinical correlation and follow-up. 2.  Diffuse mesenteric and body wall edema, suggestive of anasarca. 3.  Large bilateral pleural effusions. This dictation was created with voice recognition software.  While attempts have  been made to review the dictation as it is transcribed, on occasion the spoken word can be misinterpreted by the technology leading to omissions or inappropriate words, phrases or sentences.  Dictated and Electronically Signed By: Anh Dao, MD [facility] Reading Station:  [id number]  10/08/2024 22:36        US  PELVIS  LIMITED  Result Date: 10/08/2024  US  PELVIS LIMITED  DATE OF SERVICE:  10/08/2024 17:57 CLINICAL INDICATION: bladder to verify retention as requested by urology team PATIENT INFORMATION: Bladder distention COMPARISON: None TECHNIQUE:  Real-time imaging of the urinary bladder FINDINGS:  Bladder contour is preserved. The bladder is distended. Estimated at 770 mL fluid. No filling defects or wall thickening. IMPRESSION: Distended urinary bladder.  Dictated and Electronically Signed By: Chyrl Deters, DO [facility] Reading Station:  [facility]  10/08/2024 20:47          Electronically signed by Russella Merck, MD on 10/10/2024 at 3:56 PM   "

## 2024-10-11 LAB — TYPE AND SCREEN
ABO/Rh: A NEG
Antibody Screen: NEGATIVE
Blood Bank Blood Product Expiration Date: 202512252359
Blood Bank Blood Product Expiration Date: 202512252359
Blood Bank Blood Product Expiration Date: 202512272359
Blood Bank Blood Product Expiration Date: 202512302359
Blood Bank Blood Product Expiration Date: 202512312359
Blood Bank Blood Product Expiration Date: 202601012359
Blood Bank Blood Product Expiration Date: 202601032359
Blood Bank ISBT Product Blood Type: 600
Blood Bank ISBT Product Blood Type: 600
Blood Bank ISBT Product Blood Type: 600
Blood Bank ISBT Product Blood Type: 600
Blood Bank ISBT Product Blood Type: 600
Blood Bank ISBT Product Blood Type: 600
Blood Bank ISBT Product Blood Type: 600
Blood Bank Unit Type and Rh: A NEG
Blood Bank Unit Type and Rh: A NEG
Blood Bank Unit Type and Rh: A NEG
Blood Bank Unit Type and Rh: A NEG
Blood Bank Unit Type and Rh: A NEG
Blood Bank Unit Type and Rh: A NEG
Blood Bank Unit Type and Rh: A NEG
Dispense Status Blood Bank: TRANSFUSED
Dispense Status Blood Bank: TRANSFUSED
Dispense Status Blood Bank: TRANSFUSED
Dispense Status Blood Bank: TRANSFUSED
Dispense Status Blood Bank: TRANSFUSED
Dispense Status Blood Bank: TRANSFUSED
Dispense Status Blood Bank: TRANSFUSED
Unit Divison: 0
Unit Divison: 0
Unit Divison: 0
Unit Divison: 0
Unit Divison: 0
Unit Divison: 0
Unit Divison: 0
Unit Issue Date/Time: 202511291516
Unit Issue Date/Time: 202511291832
Unit Issue Date/Time: 202511300014
Unit Issue Date/Time: 202511300157
Unit Issue Date/Time: 202511302133
Unit Issue Date/Time: 202512010004
Unit Issue Date/Time: 202512010227

## 2024-10-11 LAB — COMPREHENSIVE METABOLIC PANEL
ALT: 17 U/L (ref 10–40)
AST: 28 U/L (ref 15–37)
Albumin/Globulin Ratio: 1.1 (ref 1.1–2.2)
Albumin: 2.4 g/dL — ABNORMAL LOW (ref 3.4–5.0)
Alkaline Phosphatase: 101 U/L (ref 40–129)
Anion Gap: 16 mmol/L (ref 9–17)
BUN: 66 mg/dL — ABNORMAL HIGH (ref 7–20)
CO2: 21 mmol/L (ref 21–32)
Calcium: 8 mg/dL — ABNORMAL LOW (ref 8.3–10.6)
Chloride: 99 mmol/L (ref 99–110)
Creatinine: 4.9 mg/dL — ABNORMAL HIGH (ref 0.8–1.3)
Est, Glom Filt Rate: 12 mL/min/1.73m2 — ABNORMAL LOW (ref >60)
Glucose: 108 mg/dL — ABNORMAL HIGH (ref 74–99)
Potassium: 4 mmol/L (ref 3.5–5.1)
Sodium: 136 mmol/L (ref 136–145)
Total Bilirubin: 0.5 mg/dL (ref 0.0–1.0)
Total Protein: 4.6 g/dL — ABNORMAL LOW (ref 6.4–8.2)

## 2024-10-11 LAB — CBC WITH AUTO DIFFERENTIAL
Basophils %: 2 % — ABNORMAL HIGH (ref 0–1)
Basophils Absolute: 0.16 k/uL
Eosinophils %: 4 % — ABNORMAL HIGH (ref 0.0–3.0)
Eosinophils Absolute: 0.31 k/uL
Hematocrit: 26.9 % — ABNORMAL LOW (ref 42.0–52.0)
Hemoglobin: 9.2 g/dL — ABNORMAL LOW (ref 13.5–18.0)
Immature Granulocytes %: 1 % — ABNORMAL HIGH
Immature Granulocytes Absolute: 0.05 k/uL
Lymphocytes %: 14 % — ABNORMAL LOW (ref 24–44)
Lymphocytes Absolute: 1.07 k/uL
MCH: 29.5 pg (ref 27.0–31.0)
MCHC: 34.2 g/dL (ref 32.0–36.0)
MCV: 86.2 fL (ref 78.0–100.0)
MPV: 10.5 fL (ref 7.5–11.1)
Monocytes %: 8 % — ABNORMAL HIGH (ref 0–5)
Monocytes Absolute: 0.61 k/uL
Neutrophils %: 72 % — ABNORMAL HIGH (ref 36–66)
Neutrophils Absolute: 5.53 k/uL
Platelets: 253 k/uL (ref 140–440)
RBC: 3.12 m/uL — ABNORMAL LOW (ref 4.60–6.20)
RDW: 14.8 % (ref 11.7–14.9)
WBC: 7.7 k/uL (ref 4.0–10.5)

## 2024-10-11 LAB — POCT GLUCOSE
POC Glucose: 115 mg/dL — ABNORMAL HIGH (ref 74–99)
POC Glucose: 128 mg/dL — ABNORMAL HIGH (ref 74–99)
POC Glucose: 111 mg/dL — ABNORMAL HIGH (ref 74–99)
POC Glucose: 202 mg/dL — ABNORMAL HIGH (ref 74–99)

## 2024-10-11 LAB — HEMOGLOBIN AND HEMATOCRIT
Hematocrit: 28.9 % — ABNORMAL LOW (ref 42.0–52.0)
Hematocrit: 26 % — ABNORMAL LOW (ref 42.0–52.0)
Hematocrit: 26.4 % — ABNORMAL LOW (ref 42.0–52.0)
Hemoglobin: 9.9 g/dL — ABNORMAL LOW (ref 13.5–18.0)
Hemoglobin: 9 g/dL — ABNORMAL LOW (ref 13.5–18.0)
Hemoglobin: 9.1 g/dL — ABNORMAL LOW (ref 13.5–18.0)

## 2024-10-11 LAB — SURGICAL PATHOLOGY REPORT

## 2024-10-11 LAB — MAGNESIUM: Magnesium: 1.8 mg/dL (ref 1.8–2.4)

## 2024-10-11 LAB — PHOSPHORUS: Phosphorus: 4.8 mg/dL (ref 2.5–4.9)

## 2024-10-11 MED ORDER — FUROSEMIDE 10 MG/ML IJ SOLN
10 | Freq: Two times a day (BID) | INTRAMUSCULAR | Status: DC
Start: 2024-10-11 — End: 2024-10-13
  Administered 2024-10-11: 22:00:00 40 mg via INTRAVENOUS

## 2024-10-11 MED ORDER — POLYETHYLENE GLYCOL 3350 17 G PO PACK
17 | Freq: Once | ORAL | Status: DC
Start: 2024-10-11 — End: 2024-10-11

## 2024-10-11 MED ORDER — POLYETHYLENE GLYCOL 3350 17 GM/SCOOP PO POWD
17 | Freq: Once | ORAL | Status: AC
Start: 2024-10-11 — End: 2024-10-11
  Administered 2024-10-11: 23:00:00 238 g via ORAL

## 2024-10-11 MED ORDER — SODIUM CHLORIDE (PF) 0.9 % IJ SOLN
0.9 | INTRAMUSCULAR | Status: AC
Start: 2024-10-11 — End: 2024-10-10
  Administered 2024-10-11: 03:00:00 10 via INTRAVENOUS

## 2024-10-11 MED FILL — ACETAMINOPHEN 325 MG PO TABS: 325 mg | ORAL | Qty: 2 | Fill #0

## 2024-10-11 MED FILL — SODIUM CHLORIDE (PF) 0.9 % IJ SOLN: 0.9 % | INTRAMUSCULAR | Qty: 10 | Fill #0

## 2024-10-11 MED FILL — PANTOPRAZOLE SODIUM 40 MG IV SOLR: 40 mg | INTRAVENOUS | Qty: 40 | Fill #0

## 2024-10-11 MED FILL — FUROSEMIDE 10 MG/ML IJ SOLN: 10 mg/mL | INTRAMUSCULAR | Qty: 4 | Fill #0

## 2024-10-11 MED FILL — MIRALAX 17 GM/SCOOP PO POWD: 17 GM/SCOOP | ORAL | Qty: 238 | Fill #0

## 2024-10-11 MED FILL — FUROSEMIDE 10 MG/ML IJ SOLN: 10 mg/mL | INTRAMUSCULAR | Qty: 8 | Fill #0

## 2024-10-11 MED FILL — INSULIN LISPRO 100 UNIT/ML IJ SOLN: 100 [IU]/mL | INTRAMUSCULAR | Qty: 1 | Fill #0

## 2024-10-11 NOTE — Progress Notes (Signed)
 "    V2.0  [facility] Hospitalist Progress Note      Name:  Joe Avery DOB/Age/Sex: June 02, 1961  (63 y.o. male)   MRN & CSN:  [id number] & [id number] Encounter Date/Time: 10/11/2024 1:19 PM EST    Location:  3127/3127-A PCP: Fernand Ruffing, MD       Hospital Day: 4    Assessment and Plan:   Joe Avery is a 63 y.o. male with pmh of  who presents with Anemia due to GI blood loss      Plan:    Anemia  Melena  -Presenting complaint-melanotic stool noted in the facility  - Hemoglobin was found to be 5.2 g/dL in the ED, patient pale and lethargic  - Patient is on Eliquis-hold Eliquis for now  - S/p 2 unit PRBC transfusion on admission  -10/10/2024 overnight, patient had large black tarry bowel movement per bedside nursing.  Hemoglobin had dropped to 4.8 G/DL.  3 units PRBC transfused overnight.  This a.m. hemoglobin 10.7 G/DL  -Underwent EGD in the a.m.--with no bleeding source found  - Discussed with GI.  Plan for colonoscopy tomorrow as the patient was unable to drink the prep overnight.     AKI on hemodialysis  Anasarca  -Recent admission was complicated by AKI on ATN in the setting of rhabdomyolysis and shock, needed to be started on hemodialysis, tunneled dialysis catheter was placed on 09/27/2024, patient continued to remain oliguric and hemodialysis was continued.  -Nephrology on board now on IV diuretics and improving.    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  S/p left AKA  -Patient was initially admitted to [facility] on 09/12/2024 with severe left lower extremity pain, found to have critical limb ischemia, started on heparin  drip, emergent angiogram showed complete occlusion of left SFA, catheter directed thrombolysis with tPA, followed  by repeat angiogram with successful mechanical thrombectomy, unfortunately developed compartment syndrome with myonecrosis, nonpalpable and nondopplerable pulses with cold and mottled left leg, transferred to [facility] for vascular surgery evaluation and possible intervention.  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  Eventually plan to start on aspirin  once hemoglobin stable      Type 2 diabetes mellitus  - Patient was discharged on sliding scale as his glucose was well-controlled due to poor oral intake  - Continue on low-dose sliding scale          Diet Diet NPO Exceptions are: Sips of Water  with Meds   DVT Prophylaxis []  Lovenox , []   Heparin , []  SCDs, []  Ambulation,  []  Eliquis, []  Xarelto  []  Coumadin   Code Status Full Code   Disposition From:   Expected Disposition:   Estimated Date of Discharge:   Patient requires continued admission due to    Surrogate Decision Maker/ POA      Personally reviewed Lab Studies and Imaging         Subjective:     Chief Complaint: Rectal Bleeding  Joe Avery is a 63 y.o. male who presents with low hemoglobin.    Feels okay.  Reports he was unable to drink his bowel preparation as it does not taste good.Reports able to tolerate prep with Gatorade.         Review of Systems:    Review of Systems  Negative if not mentioned above      Objective:     Intake/Output Summary (Last 24 hours) at 10/11/2024 1108  Last data filed at 10/11/2024 0925  Gross per 24 hour   Intake 304 ml   Output 4850 ml   Net -4546 ml        Vitals:   Vitals:    10/11/24 0914   BP: 128/78   Pulse:    Resp:    Temp:    SpO2:        Physical Exam:     General: NAD  Eyes: EOMI  ENT: neck  supple  Cardiovascular: Regular rate.  Respiratory: Clear to auscultation  Gastrointestinal: Soft, non tender  Genitourinary: no suprapubic tenderness  Musculoskeletal: Left AKA, sutures intact, wound dry  Skin: warm, dry  Neuro: Alert, lethargic, follows commands   Psych: Mood appropriate.     Medications:   Medications:    furosemide   40 mg IntraVENous BID    [Held by provider] aspirin   81 mg Oral Daily    [Held by provider] atorvastatin   40 mg Oral Nightly    insulin  lispro  0-4 Units SubCUTAneous 4x Daily AC & HS    pantoprazole   40 mg IntraVENous BID    sodium chloride  flush  5-40 mL IntraVENous 2 times per day      Infusions:    sodium chloride       dextrose       sodium chloride       sodium chloride        PRN Meds: sodium chloride , , PRN  glucose, 4 tablet, PRN  dextrose  bolus, 125 mL, PRN   Or  dextrose  bolus, 250 mL, PRN  glucagon , 1 mg, PRN  dextrose , , Continuous PRN  sodium chloride  flush, 5-40 mL, PRN  sodium chloride , , PRN  ondansetron , 4 mg, Q8H PRN   Or  ondansetron , 4 mg, Q6H PRN  acetaminophen , 650 mg, Q6H PRN   Or  acetaminophen , 650 mg, Q6H PRN  sodium chloride , , PRN        Labs      Recent Results (from the past 24 hours)   SURGICAL PATHOLOGY REPORT    Collection Time: 10/10/24 11:38 AM   Result Value Ref Range    Surgical Pathology Report       Path Number: [id number]    -- Diagnosis --  Duodenal nodularity; biopsy:- Gastric foveolar metaplasia and  prominent Brunner's glands, 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  Please  correlate clinically.      Joe French, MD  **Electronically Signed Out**         lp/10/11/2024     Clinical Information  Anemia-unspecified type(D64.9), gastrointestinal  hemorrhage-unspecified gastrointestinal hemorrhage type(K92.2), no  active bleeding, duodenal nodularity, hiatal hernia        Source of Specimen  A: Duodenal nodularity biopsies      Gross Description  Received in formalin labeled with the patient's name and duodenal and  nodularity biopsies and consists  of one piece of tan tissue measuring  0.4 x 0.4 x 0.3 cm. The specimen is submitted entirely in one  cassette.  Mojun Zhao/ks:10/10/2024  Microscopic Description  Was performed.     Processing Lab:  [facility], [address]  [place], [place] [address]  Interpretation Performed at [facility] [address], [place], [place] [address]    SURGICAL PATHOLOGY REPORT       Patient Name: ZURICH, CARRENO  Med Rec: [id number]  [facility]  [address]  [place], [place] [address]  [phone]  Fax: [phone]     POCT Glucose    Collection Time: 10/10/24 12:09 PM   Result Value Ref Range    POC Glucose 121 (H) 74 - 99 mg/dL   POCT Glucose    Collection Time: 10/10/24  3:50 PM   Result Value Ref Range    POC Glucose 146 (H) 74 - 99 mg/dL   Hemoglobin and Hematocrit    Collection Time: 10/10/24  4:26 PM   Result Value Ref Range    Hemoglobin 9.8 (L) 13.5 - 18.0 g/dL    Hematocrit 71.6 (L) 42.0 - 52.0 %   POCT Glucose    Collection Time: 10/10/24  8:21 PM   Result Value Ref Range    POC Glucose 115 (H) 74 - 99 mg/dL   Hemoglobin and Hematocrit    Collection Time: 10/10/24 11:54 PM   Result Value Ref Range    Hemoglobin 9.9 (L) 13.5 - 18.0 g/dL    Hematocrit 71.0 (L) 42.0 - 52.0 %   Magnesium     Collection Time: 10/10/24 11:54 PM   Result Value Ref Range    Magnesium  1.8 1.8 - 2.4 mg/dL   Phosphorus    Collection Time: 10/10/24 11:54 PM   Result Value Ref Range    Phosphorus 4.8 2.5 - 4.9 mg/dL   Comprehensive Metabolic Panel    Collection Time: 10/10/24 11:54 PM   Result Value Ref Range    Sodium 136 136 - 145 mmol/L    Potassium 4.0 3.5 - 5.1 mmol/L    Chloride 99 99 - 110 mmol/L    CO2 21 21 - 32 mmol/L    Anion Gap 16 9 - 17 mmol/L    Glucose 108 (H) 74 - 99 mg/dL    BUN 66 (H) 7 - 20 mg/dL    Creatinine 4.9 (H) 0.8 - 1.3 mg/dL    Est, Glom Filt Rate 12 (L) >60 mL/min/1.38m2    Calcium  8.0 (L) 8.3 - 10.6 mg/dL    Total Protein 4.6 (L) 6.4 - 8.2 g/dL     Albumin 2.4 (L) 3.4 - 5.0 g/dL    Albumin/Globulin Ratio 1.1 1.1 - 2.2    Total Bilirubin 0.5 0.0 - 1.0 mg/dL    Alkaline Phosphatase 101 40 - 129 U/L    ALT 17 10 - 40 U/L    AST 28 15 - 37 U/L   POCT Glucose    Collection Time: 10/11/24  7:33 AM   Result Value Ref Range    POC Glucose 128 (H) 74 - 99 mg/dL   Hemoglobin and Hematocrit    Collection Time: 10/11/24 10:09 AM   Result Value Ref Range    Hemoglobin 9.0 (L) 13.5 - 18.0 g/dL    Hematocrit 73.9 (L) 42.0 - 52.0 %   CBC with Auto Differential    Collection Time: 10/11/24 10:10 AM   Result Value Ref Range    WBC 7.7 4.0 - 10.5 k/uL    RBC 3.12 (L) 4.60 - 6.20 m/uL  Hemoglobin 9.2 (L) 13.5 - 18.0 g/dL    Hematocrit 73.0 (L) 42.0 - 52.0 %    MCV 86.2 78.0 - 100.0 fL    MCH 29.5 27.0 - 31.0 pg    MCHC 34.2 32.0 - 36.0 g/dL    RDW 85.1 88.2 - 85.0 %    Platelets 253 140 - 440 k/uL    MPV 10.5 7.5 - 11.1 fL    Neutrophils % 72 (H) 36 - 66 %    Lymphocytes % 14 (L) 24 - 44 %    Monocytes % 8 (H) 0 - 5 %    Eosinophils % 4 (H) 0.0 - 3.0 %    Basophils % 2 (H) 0 - 1 %    Immature Granulocytes % 1 (H) 0 %    Neutrophils Absolute 5.53 k/uL    Lymphocytes Absolute 1.07 k/uL    Monocytes Absolute 0.61 k/uL    Eosinophils Absolute 0.31 k/uL    Basophils Absolute 0.16 k/uL    Immature Granulocytes Absolute 0.05 k/uL        Imaging/Diagnostics Last 24 Hours   CT ABDOMEN PELVIS WO CONTRAST Additional Contrast? None  Result Date: 10/09/2024  PROCEDURE: CT ABDOMEN PELVIS WO CONTRAST DATE OF EXAM:  10/08/2024 21:55 DEMOGRAPHICS: 63 years old Male INDICATION: Assess for hydronephrosis COMPARISON: No existing relevant imaging study corresponding to the same anatomical region is available. TECHNIQUE: Contiguous axial slices of the abdomen and pelvis were submitted without IV administration of contrast. No oral contrast was utilized. Additional  coronal reformatted images were submitted.  DOSE OPTIMIZATION: CT radiation dose optimization techniques (automated exposure  control,  and use of iterative reconstruction techniques, or adjustment of the mA and/or kV according to patient size) were used to limit patient radiation dose. FINDINGS: CT ABDOMEN: Inferior chest:  There are large bilateral pleural effusions. There is bibasilar  consolidation. The heart size is normal without pericardial effusion. Gallbladder fossa:  No focal abnormalities are seen. Biliary tree: There is no evidence for intra-or extrahepatic biliary ductal dilatation. Liver: The liver demonstrates normal appearance. No focal abnormalities are seen. Spleen: Normal size and morphology is seen. No masses are identified. Pancreas: Normal morphology without masses or inflammatory changes. Adrenals: Normal size without masses. Kidneys: There is mild bilateral hydronephrosis. Vasculature: The abdominal aorta is normal in caliber. There is mild atherosclerotic disease of the abdominal aorta. Lymphatic system: No pathologically enlarged lymph nodes are seen. Bowel: The stomach is normally distended with no focal wall abnormality. The duodenum is normal in caliber along its course. No focal abnormality is seen. The small bowel is normal caliber. There is no focal stricture or dilatation. The colon is normal in caliber. No focal abnormality is seen. The appendix is not definitively visualized. Peritoneal structures: There is diffuse mesenteric and body wall edema. There is  trace ascites.  Retroperitoneum: No focal retroperitoneal abnormality is seen. Abdominal wall:  The visualized portions of the abdominal wall are within normal  limits. CT PELVIS: Urinary bladder: There is prominent distention of the urinary bladder. There is no focal bladder wall abnormality. Soft tissues: There is no pelvic adenopathy. The prostate is mildly enlarged. There is diffuse edema in the pelvic wall. Bones: No significant abnormalities in the bony pelvis. IMPRESSION: 1.  Mild bilateral hydronephrosis. Prominent distention of the urinary bladder.  Findings could be related to urinary retention. Recommend clinical correlation and follow-up. 2.  Diffuse mesenteric and body wall edema, suggestive of anasarca. 3.  Large bilateral pleural effusions. This  dictation was created with voice recognition software.  While attempts have  been made to review the dictation as it is transcribed, on occasion the spoken word can be misinterpreted by the technology leading to omissions or inappropriate words, phrases or sentences.  Dictated and Electronically Signed By: Iantha Bumpers, MD [facility] Reading Station:  [id number]  10/08/2024 22:36        US  PELVIS LIMITED  Result Date: 10/08/2024  US  PELVIS LIMITED  DATE OF SERVICE:  10/08/2024 17:57 CLINICAL INDICATION: bladder to verify retention as requested by urology team PATIENT INFORMATION: Bladder distention COMPARISON: None TECHNIQUE:  Real-time imaging of the urinary bladder FINDINGS:  Bladder contour is preserved. The bladder is distended. Estimated at 770 mL fluid. No filling defects or wall thickening. IMPRESSION: Distended urinary bladder.  Dictated and Electronically Signed By: Chyrl Deters, DO [facility] Reading Station:  [facility]  10/08/2024 20:47          Electronically signed by Murray CINDERELLA Blanch, MD on 10/11/2024 at 11:08 AM   "

## 2024-10-11 NOTE — Progress Notes (Signed)
"  Pt refused bowel prep for a.m. colonoscopy. throughout the night. Pt states he can't tolerate the flavor, offered it with gatorade and juice. Pt still refused. Pt had 2 small loose bm, black with red tinge. Notified NP on call and Dr. Madie.   "

## 2024-10-11 NOTE — Progress Notes (Signed)
 "             [facility]             Cinderella Keel, MD      Gerri Schlatter, MD      Devera Blanch, APRN              Niels Ni, APRN-CNP             [address] [place] [address] [place], [place] [address]             [phone] fax [phone]    Gastroenterology Progress note 10/11/2024  Reason for consult: Rectal bleeding    I have personally seen and examined the patient independently. I have reviewed the patient's available data,including medical history and recent test results. Reviewed and discussed note as documented by APP.  I agree with the physical exam findings, assessment and plan.     Briefly   Patient unable to finish colonoscopy preparation.  He mentions that he was not liking the taste of it.  Educated on why the prep patient is needed.  Hemoglobin 9.2.  Reschedule colonoscopy for tomorrow.    Gen: normal mood, alert  ENT: normal TJ  Cardiovascular: RRR, No M/R/G  Respiratory: CTA BL  Gastrointestinal: Soft,NT ND  Genitourinary: no suprapubic tenderness  Musculoskeletal: no scars  Skin:moist  Neuro: alert and oriented x3    My assessment and plan reveals     #1 GI bleeding.  Patient presented with dark red blood per rectum.  On Eliquis.  EGD negative.  Colonoscopy rescheduled for tomorrow due to patient being noncompliant with prep.  Hemoglobin currently 9.2.    #2 history of tubular adenoma.  Plan to assess during colonoscopy.    3.  Anasarca and urinary retention.  Nephrology following.        My documented MDM is a substantive portion and Majority of the supervisory visit.      Comment: Please note this report has been produced using speech recognition software and may contain errors related to that system including errors in grammar, punctuation, and spelling, as well as words and phrases that may be inappropriate. If there are any questions or concerns please feel free to contact the dictating provider for clarification.      Interval H/P  Patient unable to  finish the prep today for colonoscopy. Patient stated it was not palatable and the amount to drink was too much.  Educated on the importance of finishing prep due to rectal bleeding.  Discussed with primary RN to mix next prep with orange Gatorade, patient willing to drink this.  HGB stable at 9.2  Colonoscopy tomorrow, clear liquid diet now, NPO at midnight with sips.      Recent Labs     10/08/24  1304 10/09/24  0435 10/10/24  0515 10/10/24  2354   AST 34 28 33 28   ALT 25 19 17 17    ALKPHOS 124 89 91 101   BILITOT 0.3 0.4 0.6 0.5   BILIDIR  --  <0.2  --   --      Lab Results   Component Value Date    WBC 7.7 10/11/2024    HGB 9.2 (L) 10/11/2024    HCT 26.9 (L) 10/11/2024    MCV 86.2 10/11/2024    PLT 253 10/11/2024    LYMPHOPCT 14 (L) 10/11/2024    RBC 3.12 (L) 10/11/2024    MCH 29.5 10/11/2024  MCHC 34.2 10/11/2024    RDW 14.8 10/11/2024     Physical Exam   Blood pressure 128/78, pulse 98, temperature 98.3 F (36.8 C), temperature source Oral, resp. rate 17, height 1.753 m (5' 9), weight 50.8 kg (112 lb), SpO2 95%.  Constitutional: Patient is in no distress.   Eyes: Pupils equal, round.  No icterus.  HENT: Oral mucosa is moist.   Pulmonary: No accessory muscle use. No localizing pulmonary findings.     Cardiovascular: Heart has a regular rate and rhythm, no JVD.   Gastrointestinal: Bowel sounds are present in all four quadrants. Abdomen is soft, nontender, nondistended. Rectal examination deferred.   Skin: No jaundice, spider angiomas, or palmar erythema. No purpura.  Neuro: Awake,alert, and oriented x3. No gross focal neurologic deficits.    Chart and labs reviewed.    Impression/ Plan:    #1 Rectal bleeding / Melena  - Patient having melanotic stool in facility  - HGB on admission was 5.2  - Eliquis on hold  - 2 units of PRBCs given on admission  - Large dark tarry BM overnight on 12/1, and one small dark tarry BM this morning 12/2.  - HGB is 9.2  - EGD done 12/1, no bleeding source found  - Patient unable  to finish colonoscopy prep. Discussed at length with patient to finish colonoscopy prep, clear liquid diet, NPO at midnight with sips.   - Continue PPI    #2 Anasarca   - Recent admission was complicated by AKI on ATN in the setting of rhabdomyolysis and shock.   - Started on hemodialysis, tunneled dialysis catheter was placed on 09/27/2024,   - Patient continued to remain oliguric and hemodialysis was continued.     #3 Urinary retention  - Patient found to have significant urinary retention, PVR showed 998 mL volume  - Patient appears to have had urinary retention on recent admission as well and had difficult Foley placement  - S/p Foley placement by urology    #4 L AKA  - Patient was initially admitted to [facility] on 09/12/2024 with severe left lower extremity pain, found to have critical limb ischemia, started on heparin  drip, emergent angiogram showed complete occlusion of left SFA  - Catheter directed thrombolysis with tPA, followed by repeat angiogram with successful mechanical thrombectomy, unfortunately developed compartment syndrome with myonecrosis, nonpalpable and nondopplerable pulses with cold and mottled left leg  - Transferred to [facility] for vascular surgery evaluation and possible intervention.  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  Eventually plan to start on aspirin  once hemoglobin stable     Patient's history, exam, assessment and plan of care were discussed and decided in collaboration with Dr. MD.     Thank you for allowing us  to be involved in the management of this patient. We will follow this patient along with you. Please feel free to call with any questions.      Aloha Milder, APRN -  CNP  Gastroenterology   10/11/2024   11:19 AM    "

## 2024-10-11 NOTE — Progress Notes (Signed)
 "                                                                               Cardiology Progress Note     Admit Date:  10/08/2024    Consult reason/ Seen today for :       Subjective and  Overnight Events : He is laying down comfortable      Chief complain on admission : 63 y.o.year old who is admitted for  Chief Complaint   Patient presents with    Rectal Bleeding      Assessment / Plan:  Agree with holding anticoagulation at this time due to acute GI bleed restart once stable  Start aspirin  once possible and cleared  GI workup for possible source of bleeding  Transfuse to keep hemoglobin above 8  DVT prophylaxis if no contraindication  6.   Dyslipidemia: continue statins   Discussed with primary team, hospitalist service, bedside nursing staff and family  Past medical history:    has a past medical history of Anemia due to GI blood loss, Hypertension, and Type 2 diabetes mellitus without complication, without long-term current use of insulin  (HCC).  Past surgical history:   has a past surgical history that includes Colonoscopy (N/A, 02/23/2024); Cardiac procedure (N/A, 09/12/2024); invasive vascular (N/A, 09/12/2024); invasive vascular (N/A, 09/13/2024); and Upper gastrointestinal endoscopy (N/A, 10/10/2024).  Social History:   reports that he has been smoking cigarettes. He started smoking about 44 years ago. He has a 21.7 pack-year smoking history. He has never used smokeless tobacco. He reports that he does not currently use drugs. He reports that he does not drink alcohol.  Family history:  family history includes Diabetes in his father.    No Known Allergies    Review of Systems:    All 14 systems were reviewed and are negative  Except for the positive findings  which as documented     BP (!) 138/91   Pulse 93   Temp 97.8 F (36.6 C) (Oral)   Resp 20   Ht 1.753 m (5' 9)   Wt 50.8 kg (112 lb)   SpO2 99%   BMI 16.54 kg/m     Intake/Output Summary (Last 24 hours) at 10/11/2024 1359  Last data filed at  10/11/2024 1212  Gross per 24 hour   Intake 4 ml   Output 6300 ml   Net -6296 ml     Physical Exam:  Constitutional:  Well developed, Well nourished, No acute distress, Non-toxic appearance.   HENT:  Normocephalic, Atraumatic, Bilateral external ears normal, Oropharynx moist, No oral exudates, Nose normal. Neck-  Supple, No stridor.   Eyes:  PERRL, EOMI, Conjunctiva normal, No discharge.   Respiratory:  Normal breath sounds, No respiratory distress, No wheezing, No chest tenderness.   Cardiovascular:  Normal heart rate, Normal rhythm, No murmurs, No rubs, No gallops, JVP not elevated  Abdomen/GI:  Bowel sounds normal, Soft, No tenderness, No masses, No pulsatile masses.     Musculoskeletal:  No edema, No tenderness, No cyanosis, No clubbing. Good range of motion in all major joints. No tenderness to palpation   Integument:  Warm, Dry, No erythema, No rash.  Lymphatic:  No lymphadenopathy noted.   Neurologic:  Alert & oriented x 3, Normal motor function, Normal sensory function, No focal deficits noted.   Psychiatric:  Affect  and  Mood :no change    Medications:    furosemide   40 mg IntraVENous BID    polyethylene glycol  238 g Oral Once    [Held by provider] aspirin   81 mg Oral Daily    [Held by provider] atorvastatin   40 mg Oral Nightly    insulin  lispro  0-4 Units SubCUTAneous 4x Daily AC & HS    pantoprazole   40 mg IntraVENous BID    sodium chloride  flush  5-40 mL IntraVENous 2 times per day      sodium chloride       dextrose       sodium chloride       sodium chloride        sodium chloride , glucose, dextrose  bolus **OR** dextrose  bolus, glucagon , dextrose , sodium chloride  flush, sodium chloride , ondansetron  **OR** ondansetron , acetaminophen  **OR** acetaminophen , sodium chloride     Lab Data:  CBC:   Recent Labs     10/09/24  0435 10/09/24  1345 10/10/24  0515 10/10/24  0908 10/10/24  1626 10/10/24  2354 10/11/24  1009 10/11/24  1010   WBC 11.1*  --  10.4 10.8*  --   --   --  7.7   HGB 9.0*   < > 10.7* 11.0*   < >  9.9* 9.0* 9.2*   HCT 26.8*   < > 32.1* 31.9*   < > 28.9* 26.0* 26.9*   MCV 84.0  --  87.9 85.8  --   --   --  86.2   PLT 249  --   --  208  --   --   --  253    < > = values in this interval not displayed.     BMP:   Recent Labs     10/09/24  0435 10/10/24  0515 10/10/24  2354   NA 136 132* 136   K 4.9 5.1 4.0   CL 101 100 99   CO2 28 19* 21   PHOS 4.2 4.7 4.8   BUN 52* 62* 66*   CREATININE 4.5* 4.7* 4.9*     PT/INR:   Recent Labs     10/09/24  0435 10/09/24  1345   PROTIME 18.9* 16.4*   INR 1.5 1.3     BNP:  No results for input(s): PROBNP in the last 72 hours.  TROPONIN: No results for input(s): TROPONINT in the last 72 hours.     ECHO : (interpreted by myself)  echocardiogram     Assessment:  63 y.o.year old who is admitted for  Chief Complaint   Patient presents with    Rectal Bleeding    , active issues as noted below:  Impression:  Principal Problem:    Anemia due to GI blood loss  Active Problems:    Gastrointestinal hemorrhage  Resolved Problems:    * No resolved hospital problems. *        Medical Decision Making:  The following items were considered in medical decision making:  Discussion of patient care with other providers  Reviewed clinical lab tests if any  Reviewed radiology tests if any  Reviewed other diagnostic tests/interventions  Independent review of radiologic images if any       Estimated time spent for medical decision-making encompassing complexity of the case, history taking, medication review, physical examination, communication with  family, RN, case production designer, theatre/television/film, discussion with primary team , and ancillary staff members to provide accurate care for the patient      All labs, medications and tests reviewed by myself , continue all other medications of all above medical condition listed as is except for changes mentioned above.    Thank you very much for consult , please call with questions.    Paulita Cosette Noble, MD, MD 10/11/2024 1:59 PM     The above note is prepared with the intention to  serve as communication with trained medical care practitioners only. Some of the information is provided by secondary sources as well as from our patient and/or family member(s) recollection, thus inaccuracies may occur. In addition, other professionals integrate data into the electronic medical record, and the [facility] MD and NPs are not responsible for these. Any errors will be corrected upon verification with documented reports.   "

## 2024-10-11 NOTE — Anesthesia Pre Procedure (Signed)
 "Department of Anesthesiology  Preprocedure Note       Name:  Joe Avery   Age:  63 y.o.  DOB:  September 20, 1961                                          MRN:  [id number]         Date:  10/11/2024      Surgeon: Clotilde):  Madie Dine, MD    Procedure: Procedure(s):  COLONOSCOPY DIAGNOSTIC    Medications prior to admission:   Prior to Admission medications   Medication Sig Start Date End Date Taking? Authorizing Provider   Glucagon  3 MG/DOSE POWD 3 mg by Nasal route as needed (for low blood sugar)   Yes [provider]   carvedilol (COREG) 6.25 MG tablet Take 1 tablet by mouth 2 times daily (with meals)   Yes [provider]   apixaban (ELIQUIS) 5 MG TABS tablet Take 1 tablet by mouth 2 times daily   Yes [provider]   amitriptyline (ELAVIL) 10 MG tablet Take 1 tablet by mouth 2 times daily   Yes [provider]   mirtazapine (REMERON) 15 MG tablet Take 0.5 tablets by mouth nightly   Yes [provider]   oxyCODONE (ROXICODONE) 5 MG immediate release tablet Take 1 tablet by mouth every 8 hours as needed for Pain. Max Daily Amount: 15 mg   Yes [provider]   polyethylene glycol (GLYCOLAX ) 17 g packet Take 1 packet by mouth daily   Yes [provider]   aspirin  81 MG chewable tablet Take 1 tablet by mouth daily 12/14/23  Yes D'Entremont, Mabel DASEN, MD   gabapentin  (NEURONTIN ) 100 MG capsule Take 1 capsule by mouth in the morning and at bedtime. 08/06/23 10/09/24 Yes Fernande Alfonso Sahara, APRN - CNP   amLODIPine  (NORVASC ) 10 MG tablet Take 1 tablet by mouth daily 02/02/23 10/09/24 Yes [provider]   atorvastatin  (LIPITOR ) 40 MG tablet Take 1 tablet by mouth nightly  Patient not taking: Reported on 10/09/2024 12/13/23   D'Entremont, Mabel DASEN, MD   metFORMIN (GLUCOPHAGE) 500 MG tablet Take 1 tablet by mouth daily (with breakfast)  Patient not taking: Reported on 10/09/2024 08/12/23   [provider]   raNITIdine HCl (RANITIDINE 150 MAX  STRENGTH PO) Take by mouth as needed  Patient not taking: Reported on 10/09/2024    [provider]   Aspirin -Acetaminophen -Caffeine (EXCEDRIN PO) Take by mouth as needed  Patient not taking: Reported on 10/09/2024    [provider]   lisinopril  (PRINIVIL ;ZESTRIL ) 40 MG tablet Take 1 tablet by mouth daily 02/02/23 05/18/24  [provider]       Current medications:    Current Facility-Administered Medications   Medication Dose Route Frequency Provider Last Rate Last Admin    furosemide  (LASIX ) injection 40 mg  40 mg IntraVENous BID Laroy Gatha Pulling, MD   40 mg at 10/11/24 1634    0.9 % sodium chloride  infusion   IntraVENous PRN Author Head, MD        [facility] by provider] aspirin  chewable tablet 81 mg  81 mg Oral Daily Author Head, MD        [Held by provider] atorvastatin  (LIPITOR ) tablet 40 mg  40 mg Oral Nightly Author Head, MD        glucose chewable tablet 16 g  4  tablet Oral PRN Author Head, MD        dextrose  bolus 10% 125 mL  125 mL IntraVENous PRN Author Head, MD        Or    dextrose  bolus 10% 250 mL  250 mL IntraVENous PRN Author Head, MD        glucagon  injection 1 mg  1 mg SubCUTAneous PRN Author Head, MD        dextrose  10 % infusion   IntraVENous Continuous PRN Author Head, MD        insulin  lispro (HUMALOG ,ADMELOG ) injection vial 0-4 Units  0-4 Units SubCUTAneous 4x Daily AC & HS Author Head, MD   1 Units at 10/11/24 1637    pantoprazole  (PROTONIX ) injection 40 mg  40 mg IntraVENous BID Author Head, MD   40 mg at 10/11/24 0913    sodium chloride  flush 0.9 % injection 5-40 mL  5-40 mL IntraVENous 2 times per day Author Head, MD   10 mL at 10/10/24 2204    sodium chloride  flush 0.9 % injection 5-40 mL  5-40 mL IntraVENous PRN Author Head, MD        0.9 % sodium chloride  infusion   IntraVENous PRN Author Head, MD        ondansetron  (ZOFRAN -ODT) disintegrating tablet 4 mg  4 mg Oral Q8H PRN Kunwar, Kabir, MD   4 mg at 10/10/24 1836    Or     ondansetron  (ZOFRAN ) injection 4 mg  4 mg IntraVENous Q6H PRN Author Head, MD        acetaminophen  (TYLENOL ) tablet 650 mg  650 mg Oral Q6H PRN Author Head, MD   650 mg at 10/11/24 1549    Or    acetaminophen  (TYLENOL ) suppository 650 mg  650 mg Rectal Q6H PRN Author Head, MD        0.9 % sodium chloride  infusion   IntraVENous PRN Markovitz, Christina L, APRN - CNP           Allergies:  No Known Allergies    Problem List:    Patient Active Problem List   Diagnosis Code    Chronic kidney disease, stage III (moderate) (HCC) N18.30    Primary hypertension I10    Low vitamin D  level R79.89    Type 2 diabetes mellitus without complication, without long-term current use of insulin  (HCC) E11.9    Persistent proteinuria R80.1    Stroke-like symptoms R29.90    Primary insomnia F51.01    Chronic kidney disease, 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    Acute lower limb ischemia I99.8    Critical limb ischemia of left lower extremity (HCC) I70.222    Anemia due to GI blood loss D50.0    Gastrointestinal hemorrhage K92.2       Past Medical History:        Diagnosis Date    Anemia due to GI blood loss 10/08/2024    Hypertension     Type 2 diabetes mellitus without complication, without long-term current use of insulin  (HCC) 08/11/2023       Past Surgical History:        Procedure Laterality Date    CARDIAC PROCEDURE N/A 09/12/2024    Peripheral angiography performed by Bertell Clarine Nurse, MD at [facility] CARDIAC CATH LAB    COLONOSCOPY N/A 02/23/2024    COLONOSCOPY POLYPECTOMY SNARE/BIOPSY performed by Madie Dine, MD at [facility] ENDOSCOPY    INVASIVE VASCULAR N/A 09/12/2024    Thrombectomy peripheral artery performed by Bertell Clarine Nurse,  MD at [facility] CARDIAC CATH LAB    INVASIVE VASCULAR N/A 09/13/2024    Thrombolysis peripheral artery performed by Merrianne Deatrice GORMAN DELENA, MD at [facility] CARDIAC CATH LAB    UPPER GASTROINTESTINAL ENDOSCOPY N/A 10/10/2024    ESOPHAGOGASTRODUODENOSCOPY BIOPSY performed by Madie Dine, MD at [facility] ENDOSCOPY       Social History:     Social History     Tobacco Use    Smoking status: Every Day     Current packs/day: 0.10     Average packs/day: 0.5 packs/day for 44.9 years (21.7 ttl pk-yrs)     Types: Cigarettes     Start date: 98    Smokeless tobacco: Never    Tobacco comments:     Down to 3 per day   Substance Use Topics    Alcohol use: Never                                Ready to quit: Not Answered  Counseling given: Not Answered  Tobacco comments: Down to 3 per day      Vital Signs (Current):   Vitals:    10/11/24 1200 10/11/24 1500 10/11/24 1634 10/11/24 1800   BP: (!) 138/91 128/81 (!) 139/101    Pulse: 93 94     Resp: 20 19     Temp: 36.6 C (97.8 F) 37.1 C (98.7 F)  36.9 C (98.4 F)   TempSrc: Oral Oral  Oral   SpO2: 99% 96%     Weight:       Height:                                                  BP Readings from Last 3 Encounters:   10/11/24 (!) 139/101   09/13/24 101/70   05/18/24 (!) 142/70       NPO Status: Time of last liquid consumption: 0000                        Time of last solid consumption: 0000                        Date of last liquid consumption: 10/09/24                        Date of last solid food consumption: 10/09/24    BMI:   Wt Readings from Last 3 Encounters:   10/08/24 50.8 kg (112 lb)   09/13/24 50.9 kg (112 lb 3.4 oz)   05/18/24 55.2 kg (121 lb 9.6 oz)     Body mass index is 16.54 kg/m.    CBC:   Lab Results   Component Value Date/Time    WBC 7.7 10/11/2024 10:10 AM    RBC 3.12 10/11/2024 10:10 AM    HGB 9.1 10/11/2024 03:14 PM    HCT 26.4 10/11/2024 03:14 PM    MCV 86.2 10/11/2024 10:10 AM    RDW 14.8 10/11/2024 10:10 AM    PLT 253 10/11/2024 10:10 AM       CMP:   Lab Results   Component Value Date/Time    NA 136 10/10/2024 11:54 PM    K 4.0 10/10/2024 11:54 PM  CL 99 10/10/2024 11:54 PM    CO2 21 10/10/2024 11:54 PM    BUN 66 10/10/2024 11:54 PM    CREATININE 4.9 10/10/2024 11:54 PM    LABGLOM 12 10/10/2024 11:54 PM    GLUCOSE 108 10/10/2024 11:54 PM    CALCIUM  8.0 10/10/2024 11:54 PM    BILITOT  0.5 10/10/2024 11:54 PM    ALKPHOS 101 10/10/2024 11:54 PM    AST 28 10/10/2024 11:54 PM    ALT 17 10/10/2024 11:54 PM       POC Tests:   Recent Labs     10/11/24  1543   POCGLU 202*       Coags:   Lab Results   Component Value Date/Time    PROTIME 16.4 10/09/2024 01:45 PM    INR 1.3 10/09/2024 01:45 PM    APTT >240.0 09/13/2024 01:42 PM       HCG (If Applicable): No results found for: PREGTESTUR, PREGSERUM, HCG, HCGQUANT     ABGs: No results found for: PHART, PO2ART, PCO2ART, HCO3ART, BEART, O2SATART     Type & Screen (If Applicable):  Lab Results   Component Value Date    ABORH A NEGATIVE 10/08/2024    LABANTI NEGATIVE 10/08/2024       Drug/Infectious Status (If Applicable):  No results found for: HIV, HEPCAB    COVID-19 Screening (If Applicable): No results found for: COVID19        Anesthesia Evaluation          Airway:  Mallampati: II  TM distance: >3 FB   Neck ROM: full    Mouth opening: > = 3 FB   Dental:  normal exam         Pulmonary:       (+)                 a smoker:                      Cardiovascular:       (+)     hypertension:                      hyperlipidemia                            Neuro/Psych:    Negative Neuro/Psych ROS             GI/Hepatic/Renal:    (+)         renal disease: CRI          Endo/Other:     (+) Diabetes: Type II DM                       Abdominal:              Vascular:  negative vascular ROS.       Other Findings:            Anesthesia Plan      MAC     ASA 3     (Chart review from 12/1 egd)                              Ozell LITTIE Cobia, APRN - CRNA   10/11/2024            "

## 2024-10-11 NOTE — Progress Notes (Signed)
 "Nephrology Progress Note  10/11/2024 9:32 AM        Subjective:   Admit Date: 10/08/2024  PCP: Fernand Ruffing, MD    Interval History: I have seen Joe Avery in early morning    Diet: Reported eating some    ROS: Stump pain, urine output recorded about roughly 4-1/2 L, no fever and acceptable blood pressure    Underwent esophagogastroduodenoscopy, duodenal bulb biopsy were done, no evidence of active bleeding were found    Data:     Current meds:    furosemide   80 mg IntraVENous BID    [Held by provider] aspirin   81 mg Oral Daily    [Held by provider] atorvastatin   40 mg Oral Nightly    insulin  lispro  0-4 Units SubCUTAneous 4x Daily AC & HS    pantoprazole   40 mg IntraVENous BID    sodium chloride  flush  5-40 mL IntraVENous 2 times per day      sodium chloride       dextrose       sodium chloride       sodium chloride            I/O last 3 completed shifts:  In: 1402.8 [I.V.:304; Blood:1098.8]  Out: 5350 [Urine:5350]    CBC:   Recent Labs     10/08/24  1304 10/08/24  2238 10/09/24  0435 10/09/24  1345 10/10/24  0515 10/10/24  0908 10/10/24  1626 10/10/24  2354   WBC 8.5  --  11.1*  --  10.4 10.8*  --   --    HGB 5.2*   < > 9.0*   < > 10.7* 11.0* 9.8* 9.9*   PLT 364  --  249  --   --  208  --   --     < > = values in this interval not displayed.          Recent Labs     10/09/24  0435 10/10/24  0515 10/10/24  2354   NA 136 132* 136   K 4.9 5.1 4.0   CL 101 100 99   CO2 28 19* 21   BUN 52* 62* 66*   CREATININE 4.5* 4.7* 4.9*   GLUCOSE 156* 153* 108*       Lab Results   Component Value Date    CALCIUM  8.0 (L) 10/10/2024    PHOS 4.8 10/10/2024       Objective:     Vitals: BP 128/78   Pulse 98   Temp 98.3 F (36.8 C) (Oral)   Resp 17   Ht 1.753 m (5' 9)   Wt 50.8 kg (112 lb)   SpO2 95%   BMI 16.54 kg/m ,    General appearance: Alert, awake and oriented  HEENT: Surprisingly no gross conjunctival pallor, he has dental caries no scleral icterus  Neck: Supple  Lungs: No crackles on auscultation-left intrajugular tunneled  cuffed dialysis catheter  Heart: Irregular  Abdomen: Soft  Extremities: Right leg edema better, left above-knee amputation and stump pain  He does have a Foley catheter      Problem List :         Impression :     Stage III acute kidney disease-mainly from compartment syndrome/rhabdomyolysis now acute bladder obstruction and renal venous congestion-creatinine is pending but he responded to IV loop  Extracellular fluid, responding to the intravenous loop with recent acute bladder obstruction likely from prostatomegaly  Underlying diabetes, hypoalbuminemia, anasarca, gastrointestinal bleeding    Recommendation/Plan  :  Reduce diuretics to 40 mg twice a day-restrict urine output 3 to 4 L/day-watch for kidney recovery-watch for iatrogenic nosocomial complication and good sugar control.  No plan for dialysis and hopefully can recover kidney function and also likely remove tunneled catheter.  Clinically and biochemically      Gatha Koleen Box, MD MD    "

## 2024-10-11 NOTE — Plan of Care (Signed)
"    Problem: Chronic Conditions and Co-morbidities  Goal: Patient's chronic conditions and co-morbidity symptoms are monitored and maintained or improved  10/11/2024 1107 by Glennon Burrs, RN  Outcome: Progressing  10/11/2024 0538 by Bluford Alaine RAMAN, RN  Outcome: Progressing     Problem: Discharge Planning  Goal: Discharge to home or other facility with appropriate resources  10/11/2024 1107 by Glennon Burrs, RN  Outcome: Progressing  10/11/2024 0538 by Bluford Alaine RAMAN, RN  Outcome: Progressing     Problem: Pain  Goal: Verbalizes/displays adequate comfort level or baseline comfort level  10/11/2024 1107 by Glennon Burrs, RN  Outcome: Progressing  10/11/2024 0538 by Bluford Alaine RAMAN, RN  Outcome: Progressing     Problem: Skin/Tissue Integrity  Goal: Skin integrity remains intact  Description: 1.  Monitor for areas of redness and/or skin breakdown  2.  Assess vascular access sites hourly  3.  Every 4-6 hours minimum:  Change oxygen saturation probe site  4.  Every 4-6 hours:  If on nasal continuous positive airway pressure, respiratory therapy assess nares and determine need for appliance change or resting period  10/11/2024 1107 by Glennon Burrs, RN  Outcome: Progressing  10/11/2024 0538 by Bluford Alaine RAMAN, RN  Outcome: Progressing     "

## 2024-10-11 NOTE — Plan of Care (Signed)
"    Problem: Chronic Conditions and Co-morbidities  Goal: Patient's chronic conditions and co-morbidity symptoms are monitored and maintained or improved  Outcome: Progressing     Problem: Discharge Planning  Goal: Discharge to home or other facility with appropriate resources  Outcome: Progressing     Problem: Pain  Goal: Verbalizes/displays adequate comfort level or baseline comfort level  Outcome: Progressing     Problem: Safety - Adult  Goal: Free from fall injury  Outcome: Progressing     "

## 2024-10-11 NOTE — Progress Notes (Signed)
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  -Highly recommend to resume anticoagulation to soon as possible  AKI on hemodialysis  Anasarca  Urinary retention  -Nephrology following  Anemia with melena  - GI following  -EGD unremarkable.  Plans for colonoscopy  Severe on controlled  - A1c 11.3.  - Per primary team      Lonni Barefoot PA-C 10/11/24 1:32 PM        "

## 2024-10-12 LAB — COMPREHENSIVE METABOLIC PANEL
ALT: 18 U/L (ref 10–40)
AST: 24 U/L (ref 15–37)
Albumin/Globulin Ratio: 1.1 (ref 1.1–2.2)
Albumin: 2.7 g/dL — ABNORMAL LOW (ref 3.4–5.0)
Alkaline Phosphatase: 122 U/L (ref 40–129)
Anion Gap: 12 mmol/L (ref 9–17)
BUN: 54 mg/dL — ABNORMAL HIGH (ref 7–20)
CO2: 28 mmol/L (ref 21–32)
Calcium: 8.1 mg/dL — ABNORMAL LOW (ref 8.3–10.6)
Chloride: 96 mmol/L — ABNORMAL LOW (ref 99–110)
Creatinine: 4.7 mg/dL — ABNORMAL HIGH (ref 0.8–1.3)
Est, Glom Filt Rate: 13 mL/min/1.73m2 — ABNORMAL LOW (ref >60)
Glucose: 153 mg/dL — ABNORMAL HIGH (ref 74–99)
Potassium: 3.4 mmol/L — ABNORMAL LOW (ref 3.5–5.1)
Sodium: 136 mmol/L (ref 136–145)
Total Bilirubin: 0.3 mg/dL (ref 0.0–1.0)
Total Protein: 5.2 g/dL — ABNORMAL LOW (ref 6.4–8.2)

## 2024-10-12 LAB — CBC WITH AUTO DIFFERENTIAL
Basophils %: 1 % (ref 0–1)
Basophils Absolute: 0.13 k/uL
Eosinophils %: 2 % (ref 0.0–3.0)
Eosinophils Absolute: 0.21 k/uL
Hematocrit: 32.2 % — ABNORMAL LOW (ref 42.0–52.0)
Hemoglobin: 10.8 g/dL — ABNORMAL LOW (ref 13.5–18.0)
Immature Granulocytes %: 1 % — ABNORMAL HIGH
Immature Granulocytes Absolute: 0.07 k/uL
Lymphocytes %: 8 % — ABNORMAL LOW (ref 24–44)
Lymphocytes Absolute: 0.89 k/uL
MCH: 29.3 pg (ref 27.0–31.0)
MCHC: 33.5 g/dL (ref 32.0–36.0)
MCV: 87.5 fL (ref 78.0–100.0)
MPV: 10.2 fL (ref 7.5–11.1)
Monocytes %: 7 % (ref 0–9)
Monocytes Absolute: 0.73 k/uL
Neutrophils %: 82 % — ABNORMAL HIGH (ref 36–66)
Neutrophils Absolute: 9.23 k/uL
Platelets: 318 k/uL (ref 140–440)
RBC: 3.68 m/uL — ABNORMAL LOW (ref 4.60–6.20)
RDW: 14.6 % (ref 11.7–14.9)
WBC: 11.3 k/uL — ABNORMAL HIGH (ref 4.0–10.5)

## 2024-10-12 LAB — POCT GLUCOSE
POC Glucose: 207 mg/dL — ABNORMAL HIGH (ref 74–99)
POC Glucose: 162 mg/dL — ABNORMAL HIGH (ref 74–99)
POC Glucose: 130 mg/dL — ABNORMAL HIGH (ref 74–99)
POC Glucose: 207 mg/dL — ABNORMAL HIGH (ref 74–99)
POC Glucose: 94 mg/dL (ref 74–99)

## 2024-10-12 LAB — PHOSPHORUS: Phosphorus: 5.6 mg/dL — ABNORMAL HIGH (ref 2.5–4.9)

## 2024-10-12 LAB — MAGNESIUM: Magnesium: 1.6 mg/dL — ABNORMAL LOW (ref 1.8–2.4)

## 2024-10-12 MED ORDER — POTASSIUM CHLORIDE CRYS ER 20 MEQ PO TBCR
20 | Freq: Two times a day (BID) | ORAL | Status: AC
Start: 2024-10-12 — End: 2024-10-12
  Administered 2024-10-12 (×2): 40 meq via ORAL

## 2024-10-12 MED ORDER — PROPOFOL 200 MG/20ML IV EMUL
200 | INTRAVENOUS | Status: AC
Start: 2024-10-12 — End: 2024-10-12

## 2024-10-12 MED ORDER — LIDOCAINE HCL (PF) 2 % IJ SOLN
2 | Freq: Once | INTRAMUSCULAR | Status: DC | PRN
Start: 2024-10-12 — End: 2024-10-12
  Administered 2024-10-12: 13:00:00 100 via INTRAVENOUS

## 2024-10-12 MED ORDER — MAGNESIUM SULFATE 2000 MG/50 ML IVPB PREMIX
2 | Freq: Once | INTRAVENOUS | Status: AC
Start: 2024-10-12 — End: 2024-10-12
  Administered 2024-10-12: 17:00:00 2000 mg via INTRAVENOUS

## 2024-10-12 MED ORDER — LIDOCAINE HCL (CARDIAC) PF 100 MG/5ML IV SOLN
100 | INTRAVENOUS | Status: AC
Start: 2024-10-12 — End: 2024-10-12

## 2024-10-12 MED ORDER — PROPOFOL 200 MG/20ML IV EMUL
200 | Freq: Once | INTRAVENOUS | Status: DC | PRN
Start: 2024-10-12 — End: 2024-10-12
  Administered 2024-10-12: 13:00:00 110 via INTRAVENOUS

## 2024-10-12 MED FILL — INSULIN LISPRO 100 UNIT/ML IJ SOLN: 100 [IU]/mL | INTRAMUSCULAR | Qty: 2 | Fill #0

## 2024-10-12 MED FILL — MAGNESIUM SULFATE 2 GM/50ML IV SOLN: 2 GM/50ML | INTRAVENOUS | Qty: 50 | Fill #0

## 2024-10-12 MED FILL — PANTOPRAZOLE SODIUM 40 MG IV SOLR: 40 mg | INTRAVENOUS | Qty: 40 | Fill #0

## 2024-10-12 MED FILL — LIDOCAINE HCL (CARDIAC) PF 100 MG/5ML IV SOLN: 100 MG/5ML | INTRAVENOUS | Qty: 5 | Fill #0

## 2024-10-12 MED FILL — ACETAMINOPHEN 325 MG PO TABS: 325 mg | ORAL | Qty: 2 | Fill #0

## 2024-10-12 MED FILL — INSULIN LISPRO 100 UNIT/ML IJ SOLN: 100 [IU]/mL | INTRAMUSCULAR | Qty: 1 | Fill #0

## 2024-10-12 MED FILL — POTASSIUM CHLORIDE CRYS ER 20 MEQ PO TBCR: 20 meq | ORAL | Qty: 2 | Fill #0

## 2024-10-12 MED FILL — DIPRIVAN 200 MG/20ML IV EMUL: 200 MG/20ML | INTRAVENOUS | Qty: 40 | Fill #0

## 2024-10-12 NOTE — Progress Notes (Signed)
 "Nephrology Progress Note  10/12/2024 9:56 AM        Subjective:   Admit Date: 10/08/2024  PCP: Fernand Ruffing, MD    Interval History: I saw him right after his colonoscopy    Diet: He is feeling hungry and wanted to eat    ROS: No confusion or shortness of breath, urine output 6.1 L with lower dose of loop.  No fever and acceptable blood pressure    Data:     Current meds:    magnesium  sulfate  2,000 mg IntraVENous Once    [Held by provider] furosemide   40 mg IntraVENous BID    [Held by provider] aspirin   81 mg Oral Daily    [Held by provider] atorvastatin   40 mg Oral Nightly    insulin  lispro  0-4 Units SubCUTAneous 4x Daily AC & HS    pantoprazole   40 mg IntraVENous BID    sodium chloride  flush  5-40 mL IntraVENous 2 times per day      sodium chloride       dextrose       sodium chloride       sodium chloride            I/O last 3 completed shifts:  In: 14 [I.V.:14]  Out: 9550 [Urine:9550]    CBC:   Recent Labs     10/10/24  0908 10/10/24  1626 10/11/24  1010 10/11/24  1514 10/12/24  0912   WBC 10.8*  --  7.7  --  11.3*   HGB 11.0*   < > 9.2* 9.1* 10.8*   PLT 208  --  253  --  318    < > = values in this interval not displayed.          Recent Labs     10/10/24  0515 10/10/24  2354 10/12/24  0912   NA 132* 136 136   K 5.1 4.0 3.4*   CL 100 99 96*   CO2 19* 21 28   BUN 62* 66* 54*   CREATININE 4.7* 4.9* 4.7*   GLUCOSE 153* 108* 153*       Lab Results   Component Value Date    CALCIUM  8.1 (L) 10/12/2024    PHOS 5.6 (H) 10/12/2024       Objective:     Vitals: BP (!) 145/86   Pulse 95   Temp 98.1 F (36.7 C) (Oral)   Resp 21   Ht 1.753 m (5' 9)   Wt 52.6 kg (116 lb)   SpO2 99%   BMI 17.13 kg/m ,    General appearance: Alert awake and oriented  HEENT: Positive conjunctival pallor and dental caries  Neck: No gross abnormality, left internal jugular tunneled cuffed dialysis catheter  Lungs: No gross crackles  Heart: Irregular  Abdomen: Soft  Extremities: Still has right leg edema and of course his left above-knee  amputation  He also has a Foley catheter      Problem List :         Impression :     Stage III acute kidney disease-looks like recovering as expected  .  Acute bladder obstruction with extracellular fluid retention as well as underlying diabetes gastrointestinal bleeding hypoalbuminemia etc.    Recommendation/Plan  :     I feel comfortable removing his tunneled dialysis catheter, I feel like he will recover his kidney function.  I would also hold the diuretics, hopefully can send him back to nursing home tomorrow with close outpatient follow-up with  me and neurology service.  Will optimize therapy for other chronic condition and follow clinically      Gatha Koleen Box, MD MD    "

## 2024-10-12 NOTE — Discharge Instr - COC (Addendum)
 "Continuity of Care Form    Patient Name: Joe Avery   DOB:  [birth year]  MRN:  [id number]    Admit date:  10/08/2024  Discharge date:  ***    Code Status Order: Full Code   Advance Directives:    Date/Time Healthcare Directive Type of Healthcare Directive Copy in Chart Healthcare Agent Appointed Healthcare Agent's Name Healthcare Agent's Phone Number    10/13/24 0606 No, patient does not have an advance directive for healthcare treatment  --  --  --  --  --     10/12/24 0729 No, patient does not have an advance directive for healthcare treatment  --  --  --  --  --     10/10/24 1017 No, patient does not have an advance directive for healthcare treatment  --  --  --  --  --        Admitting Physician:  Russella Merck, MD  PCP: Fernand Ruffing, MD    Discharging Nurse: Reginald Boston  Discharging [facility] Unit/Room#: 3127/3127-A  Discharging Unit Phone Number: [phone]    Emergency Contact:   Extended Emergency Contact Information  Primary Emergency Contact: [facility]  Home Phone: [phone]  Relation: Parent  Secondary Emergency Contact: eckenrode,ron  Mobile Phone: [phone]  Relation: Other    Past Surgical History:  Past Surgical History:   Procedure Laterality Date    CARDIAC PROCEDURE N/A 09/12/2024    Peripheral angiography performed by Bertell Clarine Nurse, MD at [facility] CARDIAC CATH LAB    COLONOSCOPY N/A 02/23/2024    COLONOSCOPY POLYPECTOMY SNARE/BIOPSY performed by Madie Dine, MD at [facility] ENDOSCOPY    COLONOSCOPY N/A 10/12/2024    COLONOSCOPY POLYPECTOMY SNARE/BIOPSY performed by Madie Dine, MD at [facility] ENDOSCOPY    INVASIVE VASCULAR N/A 09/12/2024    Thrombectomy peripheral artery performed by Bertell Clarine Nurse, MD at [facility] CARDIAC CATH LAB    INVASIVE VASCULAR N/A 09/13/2024    Thrombolysis peripheral artery performed by Merrianne Deatrice GORMAN DELENA, MD at [facility] CARDIAC CATH LAB    UPPER GASTROINTESTINAL ENDOSCOPY N/A 10/10/2024    ESOPHAGOGASTRODUODENOSCOPY BIOPSY performed by Madie Dine, MD at [facility] ENDOSCOPY        Immunization History:   Immunization History   Administered Date(s) Administered    Influenza, FLUARIX, FLULAVAL, FLUZONE, (age 41 mo+), AFLURIA, (age 19 y+), IM, Trivalent PF, 0.24mL 08/12/2023    Pneumococcal, PCV20, PREVNAR 20, (age 41w+), IM, 0.19mL 04/09/2023, 07/11/2023    TDaP, ADACEL (age 17y-64y), BOOSTRIX (age 10y+), IM, 0.70mL 04/09/2023    Zoster Recombinant (Shingrix) 04/09/2023, 08/27/2023       Active Problems:  Patient Active Problem List   Diagnosis Code    Chronic kidney disease, stage III (moderate) (HCC) N18.30    Primary hypertension I10    Low vitamin D  level R79.89    Type 2 diabetes mellitus without complication, without long-term current use of insulin  (HCC) E11.9    Persistent proteinuria R80.1    Stroke-like symptoms R29.90    Primary insomnia F51.01    Chronic kidney disease, 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    Acute lower limb ischemia I99.8    Critical limb ischemia of left lower extremity (HCC) I70.222    Anemia due to GI blood loss D50.0    Gastrointestinal hemorrhage K92.2       Isolation/Infection:   Isolation            No Isolation          Patient Infection Status    None  to display         Nurse Assessment:  Last Vital Signs: BP (!) 144/88   Pulse (!) 102   Temp 98.3 F (36.8 C) (Oral)   Resp 22   Ht 1.753 m (5' 9)   Wt 52.6 kg (116 lb)   SpO2 96%   BMI 17.13 kg/m     Last documented pain score (0-10 scale): Pain Level: 0  Last Weight:   Wt Readings from Last 1 Encounters:   10/12/24 52.6 kg (116 lb)     Mental Status:  oriented, alert, coherent, logical, thought processes intact, and able to concentrate and follow conversation    IV Access:  - None    Nursing Mobility/ADLs:  Walking   Dependent  Transfer  Assisted  Bathing  Assisted  Dressing  Assisted  Toileting  Assisted  Feeding  Independent  Med Admin  Independent  Med Delivery   whole    Wound Care Documentation and Therapy:  Wound 10/08/24 Surgical Left scab/abrasion (Active)   Number of days: 4       Wound 10/08/24  Surgical Closed Surgical Incision Knee Left sutures in place- Left aka (Active)   Wound Type Incision 10/13/24 0820   Incision Dressing Status Clean;Dry;Intact 10/13/24 0820   Incision Cleansed Not Cleansed 10/13/24 0335   Incision Dressing/Treatment Open to air 10/13/24 0820   Incision Margins Approximated 10/13/24 0820   Incision Assessment Dry 10/13/24 0335   Incision Drainage Amount None (dry) 10/13/24 0335   Incision Odor None 10/13/24 0335   Number of days: 4       Wound 10/08/24 Surgical Left;Proximal surgical site (Active)   Number of days: 4       Wound 10/08/24 Surgical Pelvis Left healing (Active)   Number of days: 4        Elimination:  Continence:   Bowel: Yes  Bladder: No  Urinary Catheter: Insertion Date: 12/04   Colostomy/Ileostomy/Ileal Conduit: No       Date of Last BM: 12/01    Intake/Output Summary (Last 24 hours) at 10/13/2024 1130  Last data filed at 10/13/2024 1120  Gross per 24 hour   Intake 249.65 ml   Output 2300 ml   Net -2050.35 ml     I/O last 3 completed shifts:  In: 359.7 [P.O.:300; I.V.:10; IV Piggyback:49.7]  Out: 4550 [Urine:4550]    Safety Concerns:     At Risk for Falls    Impairments/Disabilities:      Amputation - Left AKA    Nutrition Therapy:  Current Nutrition Therapy:   - Oral Diet:  Carb Control 4 carbs/meal (1800kcals/day)    Routes of Feeding: Oral  Liquids: No Restrictions  Daily Fluid Restriction: no  Last Modified Barium Swallow with Video (Video Swallowing Test):     Treatments at the Time of Hospital Discharge:   Respiratory Treatments: ***  Oxygen Therapy:  is not on home oxygen therapy.  Ventilator:    - No ventilator support    Rehab Therapies: Physical Therapy and Occupational Therapy  Weight Bearing Status/Restrictions: Non-weight bearing on left leg  Other Medical Equipment (for information only, NOT a DME order):  N/A  Other Treatments: N/A    Patient's personal belongings (please select all that are sent with patient):  None    RN SIGNATURE:   {Esignature:[id number]}    CASE MANAGEMENT/SOCIAL WORK SECTION    Inpatient Status Date: ***    Readmission Risk Assessment Score:  [facility] RISK OF UNPLANNED READMISSION 2.0  23.6 Total Score        Discharging to Facility/ Agency   Name:   Address:  Phone:  Fax:    Dialysis Facility (if applicable)   Name:  Address:  Dialysis Schedule:  Phone:  Fax:    Case Manager/Social Worker signature: {Esignature:[id number]}    PHYSICIAN SECTION    Prognosis: Fair    Condition at Discharge: Stable    Rehab Potential (if transferring to Rehab): Good    Recommended Labs or Other Treatments After Discharge:     Keep Foley catheter in.  Urodynamic studies within 1 to 2 weeks call urology office ASAP after discharge to schedule an appointment to get this performed.  May need suprapubic catheter at a later date if pending results of the urodynamic studies.    See GI in office within 2 weeks for capsule endoscopy.    Recover from kidney injury has CKD follow-up with nephrology within 1 weeks to see if he needs to start back on dialysis.    Follow-up Information       Follow up With Specialties Details Why Contact Info    Mardovin, Vlada Wally, MD Urology Follow up in 1 week(s)  [address]  [place]  [place] [address]  [phone]      Madie Dine, MD Gastroenterology Follow up in 1 week(s)  [address] [place]  [address]  [place] [address]  [phone]      Laroy Gatha Pulling, MD Nephrology Follow up in 1 week(s)  [address]  [place] [address]  [phone]                Physician Certification: I certify the above information and transfer of Esias Mory  is necessary for the continuing treatment of the diagnosis listed and that he requires Skilled Nursing Facility for greater 30 days.     Update Admission H&P: Changes in H&P as follows - as above    PHYSICIAN SIGNATURE:  Electronically signed by Murray CINDERELLA Blanch, MD on 10/13/24 at 9:38 AM EST  "

## 2024-10-12 NOTE — Progress Notes (Signed)
 "                                                                               Cardiology Progress Note     Admit Date:  10/08/2024    Consult reason/ Seen today for :       Subjective and  Overnight Events : He is laying down comfortable      Chief complain on admission : 63 y.o.year old who is admitted for  Chief Complaint   Patient presents with    Rectal Bleeding      Assessment / Plan:  Agree with holding anticoagulation at this time due to acute GI bleed restart once stable  Start aspirin  once possible and cleared mostly due to peripheral arterial disease and acute thrombosis of the left leg yielding an amputation  Renal failure as per nephrology and primary team he continues to be oliguric on dialysis  GI workup for possible source of bleeding  Transfuse to keep hemoglobin above 8  DVT prophylaxis if no contraindication  6.   Dyslipidemia: continue statins   Discussed with primary team, hospitalist service, bedside nursing staff and family  Past medical history:    has a past medical history of Anemia due to GI blood loss, Hypertension, and Type 2 diabetes mellitus without complication, without long-term current use of insulin  (HCC).  Past surgical history:   has a past surgical history that includes Colonoscopy (N/A, 02/23/2024); Cardiac procedure (N/A, 09/12/2024); invasive vascular (N/A, 09/12/2024); invasive vascular (N/A, 09/13/2024); and Upper gastrointestinal endoscopy (N/A, 10/10/2024).  Social History:   reports that he has been smoking cigarettes. He started smoking about 44 years ago. He has a 21.7 pack-year smoking history. He has never used smokeless tobacco. He reports that he does not currently use drugs. He reports that he does not drink alcohol.  Family history:  family history includes Diabetes in his father.    No Known Allergies    Review of Systems:    All 14 systems were reviewed and are negative  Except for the positive findings  which as documented     BP (!) 149/92   Pulse 95   Temp 97.9  F (36.6 C) (Oral)   Resp 23   Ht 1.753 m (5' 9)   Wt 52.6 kg (116 lb)   SpO2 98%   BMI 17.13 kg/m     Intake/Output Summary (Last 24 hours) at 10/12/2024 1530  Last data filed at 10/12/2024 1000  Gross per 24 hour   Intake 110 ml   Output 3900 ml   Net -3790 ml     Physical Exam:  Constitutional:  Well developed, Well nourished, No acute distress, Non-toxic appearance.   HENT:  Normocephalic, Atraumatic, Bilateral external ears normal, Oropharynx moist, No oral exudates, Nose normal. Neck-  Supple, No stridor.   Eyes:  PERRL, EOMI, Conjunctiva normal, No discharge.   Respiratory:  Normal breath sounds, No respiratory distress, No wheezing, No chest tenderness.   Cardiovascular:  Normal heart rate, Normal rhythm, No murmurs, No rubs, No gallops, JVP not elevated  Abdomen/GI:  Bowel sounds normal, Soft, No tenderness, No masses, No pulsatile masses.     Musculoskeletal: Left  AKA, No cyanosis, No clubbing. Good range of motion in all major joints. No tenderness to palpation   Integument:  Warm, Dry, No erythema, No rash.   Lymphatic:  No lymphadenopathy noted.   Neurologic:  Alert & oriented x 3, Normal motor function, Normal sensory function, No focal deficits noted.   Psychiatric:  Affect  and  Mood :no change    Medications:    potassium chloride   40 mEq Oral BID WC    [Held by provider] furosemide   40 mg IntraVENous BID    [Held by provider] aspirin   81 mg Oral Daily    [Held by provider] atorvastatin   40 mg Oral Nightly    insulin  lispro  0-4 Units SubCUTAneous 4x Daily AC & HS    pantoprazole   40 mg IntraVENous BID    sodium chloride  flush  5-40 mL IntraVENous 2 times per day      sodium chloride       dextrose       sodium chloride       sodium chloride        sodium chloride , glucose, dextrose  bolus **OR** dextrose  bolus, glucagon , dextrose , sodium chloride  flush, sodium chloride , ondansetron  **OR** ondansetron , acetaminophen  **OR** acetaminophen , sodium chloride     Lab Data:  CBC:   Recent Labs      10/10/24  0908 10/10/24  1626 10/11/24  1010 10/11/24  1514 10/12/24  0912   WBC 10.8*  --  7.7  --  11.3*   HGB 11.0*   < > 9.2* 9.1* 10.8*   HCT 31.9*   < > 26.9* 26.4* 32.2*   MCV 85.8  --  86.2  --  87.5   PLT 208  --  253  --  318    < > = values in this interval not displayed.     BMP:   Recent Labs     10/10/24  0515 10/10/24  2354 10/12/24  0912   NA 132* 136 136   K 5.1 4.0 3.4*   CL 100 99 96*   CO2 19* 21 28   PHOS 4.7 4.8 5.6*   BUN 62* 66* 54*   CREATININE 4.7* 4.9* 4.7*     PT/INR:   No results for input(s): PROTIME, INR in the last 72 hours.    BNP:  No results for input(s): PROBNP in the last 72 hours.  TROPONIN: No results for input(s): TROPONINT in the last 72 hours.     ECHO : (interpreted by myself)  echocardiogram     Assessment:  63 y.o.year old who is admitted for  Chief Complaint   Patient presents with    Rectal Bleeding    , active issues as noted below:  Impression:  Principal Problem:    Anemia due to GI blood loss  Active Problems:    Gastrointestinal hemorrhage  Resolved Problems:    * No resolved hospital problems. *        Medical Decision Making:  The following items were considered in medical decision making:  Discussion of patient care with other providers  Reviewed clinical lab tests if any  Reviewed radiology tests if any  Reviewed other diagnostic tests/interventions  Independent review of radiologic images if any       Estimated time spent for medical decision-making encompassing complexity of the case, history taking, medication review, physical examination, communication with family, RN, case manager, discussion with primary team , and ancillary staff members to provide accurate care for the patient  All labs, medications and tests reviewed by myself , continue all other medications of all above medical condition listed as is except for changes mentioned above.    Thank you very much for consult , please call with questions.    Paulita Cosette Noble, MD, MD 10/12/2024  3:30 PM     The above note is prepared with the intention to serve as communication with trained medical care practitioners only. Some of the information is provided by secondary sources as well as from our patient and/or family member(s) recollection, thus inaccuracies may occur. In addition, other professionals integrate data into the electronic medical record, and the Heart Team MD and NPs are not responsible for these. Any errors will be corrected upon verification with documented reports.   "

## 2024-10-12 NOTE — Plan of Care (Signed)
"    Problem: Chronic Conditions and Co-morbidities  Goal: Patient's chronic conditions and co-morbidity symptoms are monitored and maintained or improved  Outcome: Progressing     Problem: Discharge Planning  Goal: Discharge to home or other facility with appropriate resources  Outcome: Progressing     Problem: Pain  Goal: Verbalizes/displays adequate comfort level or baseline comfort level  Outcome: Progressing     Problem: Skin/Tissue Integrity  Goal: Skin integrity remains intact  Description: 1.  Monitor for areas of redness and/or skin breakdown  2.  Assess vascular access sites hourly  3.  Every 4-6 hours minimum:  Change oxygen saturation probe site  4.  Every 4-6 hours:  If on nasal continuous positive airway pressure, respiratory therapy assess nares and determine need for appliance change or resting period  Outcome: Progressing     Problem: Safety - Adult  Goal: Free from fall injury  Outcome: Progressing     "

## 2024-10-12 NOTE — Progress Notes (Signed)
 "    V2.0  [facility] Hospitalist Progress Note      Name:  Joe Avery DOB/Age/Sex: [birth year]  (63 y.o. male)   MRN & CSN:  [id number] & [id number] Encounter Date/Time: 10/12/2024 1:19 PM EST    Location:  3127/3127-A PCP: Fernand Ruffing, MD       Hospital Day: 5    Assessment and Plan:   Joe Avery is a 63 y.o. male with pmh of  who presents with Anemia due to GI blood loss      Plan:    Anemia  Melena  -Presenting complaint-melanotic stool noted in the facility  - Hemoglobin was found to be 5.2 g/dL in the ED, patient pale and lethargic  - Patient is on Eliquis-hold Eliquis for now  - Has required total 5 units of PRBCs during the course of hospitalization  -Underwent EGD.--with no bleeding source found  - Discussed with GI.  Underwent colonoscopy notable for polyp that was excised will need to follow-up with GI in office.  Biopsy results.  Also noted to have nonbleeding hemorrhoids.  -Will resume aspirin  alone now and continue to hold anticoagulation.     AKI on hemodialysis  Anasarca  -Recent admission was complicated by AKI on ATN in the setting of rhabdomyolysis and shock, needed to be started on hemodialysis, tunneled dialysis catheter was placed on 09/27/2024, patient continued to remain oliguric and hemodialysis was continued.  -Nephrology on board now on IV diuretics and improving.  Creatinine slowly improving lots of urine output does not need long-term dialysis at this time discussed with nephrology they plan on removing temporary dialysis catheter    Urinary retention  - Patient found to have significant urinary retention, PVR showed 998 mL volume  - Patient appears to have had urinary retention on recent admission as well and had difficult Foley placement.  Now with good urine output  - S/p Foley placement by urology.  Will need urodynamic studies as outpatient and may need suprapubic catheter placement as outpatient.     Recent admission for critical limb ischemia due to arterial  thromboembolism  S/p left AKA  -Patient was initially admitted to [facility] on 09/12/2024 with severe left lower extremity pain, found to have critical limb ischemia, started on heparin  drip, emergent angiogram showed complete occlusion of left SFA, catheter directed thrombolysis with tPA, followed by repeat angiogram with successful mechanical thrombectomy, unfortunately developed compartment syndrome with myonecrosis, nonpalpable and nondopplerable pulses with cold and mottled left leg, transferred to [facility] for vascular surgery evaluation and possible intervention.  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  Started on aspirin .  Continue to hold anticoagulation at this time.     Type 2 diabetes mellitus  - Patient was discharged on sliding scale as his glucose was well-controlled due to poor oral intake  - Continue on low-dose sliding scale          Diet ADULT DIET; Regular  Diet NPO Exceptions are: Sips of Water  with Meds   DVT Prophylaxis []  Lovenox , []   Heparin , []  SCDs, []  Ambulation,  []  Eliquis, []  Xarelto  []  Coumadin   Code Status Full Code   Disposition From:   Expected Disposition: Long-term care  Estimated  Date of Discharge: 24 hours  Patient requires continued admission due to diuresis, improvement in renal function   Surrogate Decision Maker/ POA      Personally reviewed Lab Studies and Imaging         Subjective:     Chief Complaint: Rectal Bleeding       Joe Avery is a 63 y.o. male who presents with low hemoglobin.    Sleepy on exam seen after colonoscopy.  Denies any complaints.      Review of Systems:    Review of Systems  Negative if not  mentioned above      Objective:     Intake/Output Summary (Last 24 hours) at 10/12/2024 1002  Last data filed at 10/12/2024 0636  Gross per 24 hour   Intake 10 ml   Output 5350 ml   Net -5340 ml        Vitals:   Vitals:    10/12/24 0850   BP: (!) 145/86   Pulse: 95   Resp: 21   Temp: 98.1 F (36.7 C)   SpO2: 99%       Physical Exam:     General: NAD  Eyes: EOMI  ENT: neck supple  Cardiovascular: Regular rate.  Respiratory: Clear to auscultation  Gastrointestinal: Soft, non tender  Genitourinary: no suprapubic tenderness  Musculoskeletal: Left AKA, sutures intact, wound dry  Skin: warm, dry  Neuro: Alert, lethargic, follows commands   Psych: Mood appropriate.     Medications:   Medications:    magnesium  sulfate  2,000 mg IntraVENous Once    potassium chloride   40 mEq Oral BID WC    [Held by provider] furosemide   40 mg IntraVENous BID    [Held by provider] aspirin   81 mg Oral Daily    [Held by provider] atorvastatin   40 mg Oral Nightly    insulin  lispro  0-4 Units SubCUTAneous 4x Daily AC & HS    pantoprazole   40 mg IntraVENous BID    sodium chloride  flush  5-40 mL IntraVENous 2 times per day      Infusions:    sodium chloride       dextrose       sodium chloride       sodium chloride        PRN Meds: sodium chloride , , PRN  glucose, 4 tablet, PRN  dextrose  bolus, 125 mL, PRN   Or  dextrose  bolus, 250 mL, PRN  glucagon , 1 mg, PRN  dextrose , , Continuous PRN  sodium chloride  flush, 5-40 mL, PRN  sodium chloride , , PRN  ondansetron , 4 mg, Q8H PRN   Or  ondansetron , 4 mg, Q6H PRN  acetaminophen , 650 mg, Q6H PRN   Or  acetaminophen , 650 mg, Q6H PRN  sodium chloride , , PRN        Labs      Recent Results (from the past 24 hours)   Hemoglobin and Hematocrit    Collection Time: 10/11/24 10:09 AM   Result Value Ref Range    Hemoglobin 9.0 (L) 13.5 - 18.0 g/dL    Hematocrit 73.9 (L) 42.0 - 52.0 %   CBC with Auto Differential    Collection Time: 10/11/24 10:10 AM   Result Value Ref Range    WBC 7.7 4.0 - 10.5 k/uL    RBC 3.12 (L)  4.60 - 6.20 m/uL    Hemoglobin 9.2 (L) 13.5 - 18.0 g/dL    Hematocrit 73.0 (L) 42.0 - 52.0 %    MCV 86.2 78.0 -  100.0 fL    MCH 29.5 27.0 - 31.0 pg    MCHC 34.2 32.0 - 36.0 g/dL    RDW 85.1 88.2 - 85.0 %    Platelets 253 140 - 440 k/uL    MPV 10.5 7.5 - 11.1 fL    Neutrophils % 72 (H) 36 - 66 %    Lymphocytes % 14 (L) 24 - 44 %    Monocytes % 8 (H) 0 - 5 %    Eosinophils % 4 (H) 0.0 - 3.0 %    Basophils % 2 (H) 0 - 1 %    Immature Granulocytes % 1 (H) 0 %    Neutrophils Absolute 5.53 k/uL    Lymphocytes Absolute 1.07 k/uL    Monocytes Absolute 0.61 k/uL    Eosinophils Absolute 0.31 k/uL    Basophils Absolute 0.16 k/uL    Immature Granulocytes Absolute 0.05 k/uL   POCT Glucose    Collection Time: 10/11/24 11:08 AM   Result Value Ref Range    POC Glucose 111 (H) 74 - 99 mg/dL   Hemoglobin and Hematocrit    Collection Time: 10/11/24  3:14 PM   Result Value Ref Range    Hemoglobin 9.1 (L) 13.5 - 18.0 g/dL    Hematocrit 73.5 (L) 42.0 - 52.0 %   POCT Glucose    Collection Time: 10/11/24  3:43 PM   Result Value Ref Range    POC Glucose 202 (H) 74 - 99 mg/dL   POCT Glucose    Collection Time: 10/11/24  7:44 PM   Result Value Ref Range    POC Glucose 207 (H) 74 - 99 mg/dL   POCT Glucose    Collection Time: 10/11/24 10:32 PM   Result Value Ref Range    POC Glucose 162 (H) 74 - 99 mg/dL   POCT Glucose    Collection Time: 10/12/24  7:22 AM   Result Value Ref Range    POC Glucose 130 (H) 74 - 99 mg/dL   Magnesium     Collection Time: 10/12/24  9:12 AM   Result Value Ref Range    Magnesium  1.6 (L) 1.8 - 2.4 mg/dL   Phosphorus    Collection Time: 10/12/24  9:12 AM   Result Value Ref Range    Phosphorus 5.6 (H) 2.5 - 4.9 mg/dL   CBC with Auto Differential    Collection Time: 10/12/24  9:12 AM   Result Value Ref Range    WBC 11.3 (H) 4.0 - 10.5 k/uL    RBC 3.68 (L) 4.60 - 6.20 m/uL    Hemoglobin 10.8 (L) 13.5 - 18.0 g/dL    Hematocrit 67.7 (L) 42.0 - 52.0 %    MCV 87.5 78.0 - 100.0 fL    MCH 29.3 27.0 - 31.0 pg    MCHC 33.5 32.0 -  36.0 g/dL    RDW 85.3 88.2 - 85.0 %    Platelets 318 140 - 440 k/uL    MPV 10.2 7.5 - 11.1 fL    Neutrophils % 82 (H) 36 - 66 %    Lymphocytes % 8 (L) 24 - 44 %    Monocytes % 7 0 - 9 %    Eosinophils % 2 0.0 - 3.0 %    Basophils % 1 0 - 1 %    Immature Granulocytes % 1 (H) 0 %    Neutrophils Absolute 9.23 k/uL    Lymphocytes Absolute 0.89 k/uL    Monocytes Absolute 0.73 k/uL  Eosinophils Absolute 0.21 k/uL    Basophils Absolute 0.13 k/uL    Immature Granulocytes Absolute 0.07 k/uL   Comprehensive Metabolic Panel    Collection Time: 10/12/24  9:12 AM   Result Value Ref Range    Sodium 136 136 - 145 mmol/L    Potassium 3.4 (L) 3.5 - 5.1 mmol/L    Chloride 96 (L) 99 - 110 mmol/L    CO2 28 21 - 32 mmol/L    Anion Gap 12 9 - 17 mmol/L    Glucose 153 (H) 74 - 99 mg/dL    BUN 54 (H) 7 - 20 mg/dL    Creatinine 4.7 (H) 0.8 - 1.3 mg/dL    Est, Glom Filt Rate 13 (L) >60 mL/min/1.91m2    Calcium  8.1 (L) 8.3 - 10.6 mg/dL    Total Protein 5.2 (L) 6.4 - 8.2 g/dL    Albumin 2.7 (L) 3.4 - 5.0 g/dL    Albumin/Globulin Ratio 1.1 1.1 - 2.2    Total Bilirubin 0.3 0.0 - 1.0 mg/dL    Alkaline Phosphatase 122 40 - 129 U/L    ALT 18 10 - 40 U/L    AST 24 15 - 37 U/L        Imaging/Diagnostics Last 24 Hours   CT ABDOMEN PELVIS WO CONTRAST Additional Contrast? None  Result Date: 10/09/2024  PROCEDURE: CT ABDOMEN PELVIS WO CONTRAST DATE OF EXAM:  10/08/2024 21:55 DEMOGRAPHICS: 63 years old Male INDICATION: Assess for hydronephrosis COMPARISON: No existing relevant imaging study corresponding to the same anatomical region is available. TECHNIQUE: Contiguous axial slices of the abdomen and pelvis were submitted without IV administration of contrast. No oral contrast was utilized. Additional  coronal reformatted images were submitted.  DOSE OPTIMIZATION: CT radiation dose optimization techniques (automated exposure  control, and use of iterative reconstruction techniques, or adjustment of the mA and/or kV according to patient size) were used  to limit patient radiation dose. FINDINGS: CT ABDOMEN: Inferior chest:  There are large bilateral pleural effusions. There is bibasilar  consolidation. The heart size is normal without pericardial effusion. Gallbladder fossa:  No focal abnormalities are seen. Biliary tree: There is no evidence for intra-or extrahepatic biliary ductal dilatation. Liver: The liver demonstrates normal appearance. No focal abnormalities are seen. Spleen: Normal size and morphology is seen. No masses are identified. Pancreas: Normal morphology without masses or inflammatory changes. Adrenals: Normal size without masses. Kidneys: There is mild bilateral hydronephrosis. Vasculature: The abdominal aorta is normal in caliber. There is mild atherosclerotic disease of the abdominal aorta. Lymphatic system: No pathologically enlarged lymph nodes are seen. Bowel: The stomach is normally distended with no focal wall abnormality. The duodenum is normal in caliber along its course. No focal abnormality is seen. The small bowel is normal caliber. There is no focal stricture or dilatation. The colon is normal in caliber. No focal abnormality is seen. The appendix is not definitively visualized. Peritoneal structures: There is diffuse mesenteric and body wall edema. There is  trace ascites.  Retroperitoneum: No focal retroperitoneal abnormality is seen. Abdominal wall:  The visualized portions of the abdominal wall are within normal  limits. CT PELVIS: Urinary bladder: There is prominent distention of the urinary bladder. There is no focal bladder wall abnormality. Soft tissues: There is no pelvic adenopathy. The prostate is mildly enlarged. There is diffuse edema in the pelvic wall. Bones: No significant abnormalities in the bony pelvis. IMPRESSION: 1.  Mild bilateral hydronephrosis. Prominent distention of the urinary bladder. Findings could be related  to urinary retention. Recommend clinical correlation and follow-up. 2.  Diffuse mesenteric and  body wall edema, suggestive of anasarca. 3.  Large bilateral pleural effusions. This dictation was created with voice recognition software.  While attempts have  been made to review the dictation as it is transcribed, on occasion the spoken word can be misinterpreted by the technology leading to omissions or inappropriate words, phrases or sentences.  Dictated and Electronically Signed By: Iantha Bumpers, MD [facility] Reading Station:  [id number]  10/08/2024 22:36        US  PELVIS LIMITED  Result Date: 10/08/2024  US  PELVIS LIMITED  DATE OF SERVICE:  10/08/2024 17:57 CLINICAL INDICATION: bladder to verify retention as requested by urology team PATIENT INFORMATION: Bladder distention COMPARISON: None TECHNIQUE:  Real-time imaging of the urinary bladder FINDINGS:  Bladder contour is preserved. The bladder is distended. Estimated at 770 mL fluid. No filling defects or wall thickening. IMPRESSION: Distended urinary bladder.  Dictated and Electronically Signed By: Chyrl Deters, DO [facility] Reading Station:  [facility]  10/08/2024 20:47          Electronically signed by Murray CINDERELLA Blanch, MD on 10/12/2024 at 10:02 AM   "

## 2024-10-12 NOTE — Care Coordination (Signed)
"  CM in to see Pt to follow up on discharge planning. Plan remains to return to [facility] long term care when medically ready.  No precert is needed for Pt to return.     CM following   "

## 2024-10-12 NOTE — Anesthesia Postprocedure Evaluation (Signed)
"  Department of Anesthesiology  Postprocedure Note    Patient: Joe Avery  MRN: [id number]  Birthdate: 1961-09-21  Date of evaluation: 10/12/2024    Procedure Summary       Date: 10/12/24 Room / Location: [facility] ENDO 01 / [facility]    Anesthesia Start: [phone] Anesthesia Stop: 0801    Procedure: COLONOSCOPY POLYPECTOMY SNARE/BIOPSY Diagnosis:       Gastrointestinal hemorrhage, unspecified gastrointestinal hemorrhage type      (Gastrointestinal hemorrhage, unspecified gastrointestinal hemorrhage type [K92.2])    Surgeons: Madie Dine, MD Responsible Provider: Teddie Faden, MD    Anesthesia Type: MAC ASA Status: 3            Anesthesia Type: MAC    Aldrete Phase I:      Aldrete Phase II:      Anesthesia Post Evaluation    Patient location during evaluation: bedside  Patient participation: complete - patient participated  Level of consciousness: sleepy but conscious  Airway patency: patent  Nausea & Vomiting: no nausea and no vomiting  Cardiovascular status: hemodynamically stable  Respiratory status: acceptable, spontaneous ventilation and room air  Hydration status: stable  Pain management: adequate      No notable events documented.  "

## 2024-10-12 NOTE — Brief Op Note (Signed)
 "Brief Postoperative Note      Patient: Joe Avery  Date of Birth: 1961-10-24  MRN: [id number]    Date of Procedure: 2024-10-13    Pre-Op Diagnosis Codes:      * Gastrointestinal hemorrhage, unspecified gastrointestinal hemorrhage type [K92.2]    Post-Op Diagnosis: see below        Procedure(s):  COLONOSCOPY POLYPECTOMY SNARE/BIOPSY    Surgeon(s):  Madie Dine, MD    Assistant:  * No surgical staff found *    Anesthesia: Monitor Anesthesia Care    Estimated Blood Loss (mL): Minimal    Complications: None    Specimens:   ID Type Source Tests Collected by Time Destination   A : SIGMOID COLON POLYP 6MM-COLD Tissue Colon SURGICAL PATHOLOGY Madie Dine, MD 10/13/2024 [phone]        Implants:  * No implants in log *    Hemostatic Agents/Irrigation Solution (excluding Saline & Sterile Water  Irrigation):  No hemostatic agent or irrigation used        Drains:   Urinary Catheter 10/09/24 Foley (Active)   Catheter Indications Urinary retention (acute or chronic), continuous bladder irrigation or bladder outlet obstruction October 13, 2024 0715   Site Assessment Pink 10/13/2024 0715   Urine Color Yellow 2024-10-13 0715   Urine Appearance Cloudy 10/13/2024 0715   Urine Odor Malodorous 13-Oct-2024 0715   Collection Container Standard 2024-10-13 0715   Securement Method Securing device (Describe) 2024-10-13 0715   Catheter Care  Chlorhexidine  Wipes 10/13/24 0636   Catheter Best Practices  Drainage tube clipped to bed;Catheter secured to thigh;Tamper seal intact;Bag below bladder;Bag not on floor;Lack of dependent loop in tubing;Drainage bag less than half full 2024-10-13 0715   Status Draining;Patent 10/13/2024 0715   Output (mL) 1000 mL 2024/10/13 0636       [REMOVED] Urinary Catheter 09/13/24 Other (comment) (Removed)   $ Urethral catheter insertion $ Not inserted for procedure 09/13/24 1322   Catheter Indications Need for fluid volume management of the critically ill patient in a critical care setting 09/13/24 1330   Site Assessment Bleeding 09/13/24  1330   Urine Color Tea 09/13/24 1446   Urine Appearance Clear 09/13/24 1322   Collection Container Standard 09/13/24 1330   Securement Method Leg strap 09/13/24 1330   Catheter Care  Perineal wipes 09/13/24 1322   Catheter Best Practices  Drainage tube clipped to bed;Catheter secured to thigh;Tamper seal intact;Bag below bladder;Bag not on floor;Lack of dependent loop in tubing;Drainage bag less than half full 09/13/24 1330   Status Draining;Patent 09/13/24 1330   Output (mL) 75 mL 09/13/24 1630       [REMOVED] External Urinary Catheter (Removed)   Site Assessment Clean,dry & intact 09/13/24 0100   Placement Replaced 09/13/24 0100   Catheter Care Catheter/Wick replaced;Suction Canister/Tubing changed 09/13/24 0100   Perineal Care Yes 09/13/24 0100   Suction 09/13/24 0755   Urine Color Brown 09/13/24 0755   Urine Appearance Clear 09/13/24 0755   Output (mL) 150 mL 09/13/24 0755       [REMOVED] External Urinary Catheter (Removed)   Suction 10/09/24 0554   Urine Color Amber 10/09/24 0554   Urine Appearance Hazy 10/09/24 0554   Output (mL) 250 mL 10/09/24 0554       Findings:  Present At Time Of Surgery (PATOS) (choose all levels that have infection present):  No infection present    Impression:         -  One 6 mm polyp in the sigmoid colon, removed  with a cold snare.             Resected and retrieved.          -  Internal non-bleeding hemorrhoids.          -  The examination was otherwise normal on direct and retroflexion             views.     Recommendation:         -  Patient has a contact number available for emergencies.  The             signs and symptoms of potential delayed complications were discussed             with the patient.  Return to normal activities tomorrow.  Written             discharge instructions were provided to the patient.          -  Await pathology results.          -  Telephone GI clinic for pathology results in 1 week.          -  Repeat colonoscopy in 5 years for  surveillance.          -  Return patient to hospital ward for ongoing care.          -  Resume regular diet today.     Electronically signed by Cinderella Keel, MD on 10/12/2024 at 7:59 AM  "

## 2024-10-12 NOTE — Plan of Care (Signed)
"    Problem: Chronic Conditions and Co-morbidities  Goal: Patient's chronic conditions and co-morbidity symptoms are monitored and maintained or improved  10/12/2024 2129 by Colette Stabs, RN  Outcome: Progressing  10/12/2024 1409 by Jerral Bors, RN  Outcome: Progressing     Problem: Discharge Planning  Goal: Discharge to home or other facility with appropriate resources  10/12/2024 2129 by Colette Stabs, RN  Outcome: Progressing  10/12/2024 1409 by Jerral Bors, RN  Outcome: Progressing     Problem: Pain  Goal: Verbalizes/displays adequate comfort level or baseline comfort level  10/12/2024 2129 by Colette Stabs, RN  Outcome: Progressing  10/12/2024 1409 by Jerral Bors, RN  Outcome: Progressing     Problem: Skin/Tissue Integrity  Goal: Skin integrity remains intact  Description: 1.  Monitor for areas of redness and/or skin breakdown  2.  Assess vascular access sites hourly  3.  Every 4-6 hours minimum:  Change oxygen saturation probe site  4.  Every 4-6 hours:  If on nasal continuous positive airway pressure, respiratory therapy assess nares and determine need for appliance change or resting period  10/12/2024 2129 by Colette Stabs, RN  Outcome: Progressing  10/12/2024 1409 by Jerral Bors, RN  Outcome: Progressing  Flowsheets  Taken 10/12/2024 0943  Skin Integrity Remains Intact: Monitor for areas of redness and/or skin breakdown  Taken 10/12/2024 0940  Skin Integrity Remains Intact: Monitor for areas of redness and/or skin breakdown     Problem: Safety - Adult  Goal: Free from fall injury  10/12/2024 2129 by Colette Stabs, RN  Outcome: Progressing  10/12/2024 1409 by Jerral Bors, RN  Outcome: Progressing     "

## 2024-10-12 NOTE — Progress Notes (Signed)
 "    [address]   [place], [place]  [address]   Progress Note  [place] [address]      Date: 10/12/2024   Patient: Joe Avery   DOB: Mar 15, 1961   DOA: 10/08/2024   MRN: [id number]   ROOM#: 3127/3127-A     Admit Date: 10/08/2024     Collaborating Urologist on Call at time of admission: Dr. Ulysess   CC: Melena   Reason for Consult: Urine retention, AKI     Subjective:     Pain: mild, no nausea and no vomiting  Bowel Movement/Flatus: Yes  Voiding: Indwelling catheter with cloudy yellow urine    Pt resting in bed, reports feeling OK today    Objective:    Vitals:   BP (!) 145/86   Pulse 95   Temp 98.1 F (36.7 C) (Oral)   Resp 21   Ht 1.753 m (5' 9)   Wt 52.6 kg (116 lb)   SpO2 99%   BMI 17.13 kg/m   Temp  Avg: 98.4 F (36.9 C)  Min: 97.8 F (36.6 C)  Max: 98.7 F (37.1 C)    Intake/Output Summary (Last 24 hours) at 10/12/2024 0930  Last data filed at 10/12/2024 0636  Gross per 24 hour   Intake 10 ml   Output 5350 ml   Net -5340 ml       Physical Exam:   Gen: Pleasant male, in NAD, chronically ill appearing  Neuro: Non-focal  Resp: Unlabored breathing  Abd: Soft, non-distended, non-tender to palpation, no rebound  Back:   No CVAT  GU:  Indwelling catheter with cloudy yellow urine  Ext: No edema of bilateral LEs, left AKA    Labs:  WBC:    Lab Results   Component Value Date/Time    WBC 11.3 10/12/2024 09:12 AM     Hemoglobin/Hematocrit:    Lab Results   Component Value Date/Time    HGB 10.8 10/12/2024 09:12 AM    HCT 32.2 10/12/2024 09:12 AM     BMP:   Lab Results   Component Value Date/Time    NA 136 10/10/2024 11:54 PM    K 4.0 10/10/2024 11:54 PM    CL 99 10/10/2024 11:54 PM    CO2 21 10/10/2024 11:54 PM    BUN 66 10/10/2024 11:54 PM    LABALBU SPECIMEN QUANTITY NOT SUFFICIENT 06/11/2023 02:51 PM    CREATININE 4.9 10/10/2024 11:54 PM    CALCIUM  8.0 10/10/2024 11:54 PM    LABGLOM 12 10/10/2024 11:54 PM       Imaging:  CT ABDOMEN PELVIS WO CONTRAST Additional Contrast? None  Result Date:  10/09/2024  IMPRESSION: 1.  Mild bilateral hydronephrosis. Prominent distention of the urinary bladder. Findings could be related to urinary retention. Recommend clinical correlation and follow-up. 2.  Diffuse mesenteric and body wall edema, suggestive of anasarca. 3.  Large bilateral pleural effusions. This dictation was created with voice recognition software.  While attempts have  been made to review the dictation as it is transcribed, on occasion the spoken word can be misinterpreted by the technology leading to omissions or inappropriate words, phrases or sentences.  Dictated and Electronically Signed By: Iantha Bumpers, MD [facility] Reading Station:  [facility]  10/08/2024 22:36        US  PELVIS LIMITED  Result Date: 10/08/2024  US  PELVIS LIMITED  DATE OF SERVICE:  10/08/2024 17:57 CLINICAL INDICATION: bladder to verify retention as requested by urology team PATIENT INFORMATION: Bladder distention  COMPARISON: None TECHNIQUE:  Real-time imaging of the urinary bladder FINDINGS:  Bladder contour is preserved. The bladder is distended. Estimated at 770 mL fluid. No filling defects or wall thickening. IMPRESSION: Distended urinary bladder.  Dictated and Electronically Signed By: Chyrl Deters, DO [facility] Reading Station:  [facility]  10/08/2024 20:47          Assessment & Plan:      Joe Avery is a 63 y.o. 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     1) Urine retention: 88F Foley placed at bedside returning ~1L of amber urine.  Maintain Foley at this time, at least until renal function normalizes and no longer needing diuresis.  May require suprapubic catheter placement for future management due to urethral anatomy and would need this done while Eliquis held. Unfortunately he is already eating breakfast on my arrival today. Patient is agreeable to having SPT placed. Will consult IR for tomorrow, NPO after midnight, continue holding Eliquis. Will discuss with urologist today as well and update hospitalist again on plan once finalized.     2) AKI on CKD: With recent history of acute renal failure with oliguria requiring dialysis.                Creatinine 4.9.  Nephrology is following.    Patient seen and examined, chart reviewed.   Electronically signed by Millard CHRISTELLA Atkinson, PA on 10/12/2024 at 9:30 AM  "

## 2024-10-12 NOTE — Plan of Care (Signed)
"    Problem: Chronic Conditions and Co-morbidities  Goal: Patient's chronic conditions and co-morbidity symptoms are monitored and maintained or improved  10/12/2024 1409 by Jerral Bors, RN  Outcome: Progressing  10/12/2024 0201 by Miriam Motto, RN  Outcome: Progressing     Problem: Discharge Planning  Goal: Discharge to home or other facility with appropriate resources  10/12/2024 1409 by Jerral Bors, RN  Outcome: Progressing  10/12/2024 0201 by Miriam Motto, RN  Outcome: Progressing     Problem: Pain  Goal: Verbalizes/displays adequate comfort level or baseline comfort level  10/12/2024 1409 by Jerral Bors, RN  Outcome: Progressing  10/12/2024 0201 by Miriam Motto, RN  Outcome: Progressing     Problem: Safety - Adult  Goal: Free from fall injury  10/12/2024 1409 by Jerral Bors, RN  Outcome: Progressing  10/12/2024 0201 by Miriam Motto, RN  Outcome: Progressing     "

## 2024-10-12 NOTE — Progress Notes (Signed)
"  9363 Report received from Bernardino Crate - 3N nurse via telephone.   0705 Pt arrived via transport. Hooked up to monitor and VS WNL.  0710 Left FA IV flushed and leaking - removed and new IV started in right FA - flushed and hooked up to IV fluids.   9284 Verbal consent obtained and signed with Patty, RN.   0720 Pre-Op Checklist Complete  0736 Pt transferred to procedure room.     Kevan Bing, RN   "

## 2024-10-13 ENCOUNTER — Inpatient Hospital Stay: Admit: 2024-10-13 | Payer: MEDICARE | Primary: Internal Medicine

## 2024-10-13 LAB — CBC WITH AUTO DIFFERENTIAL
Basophils %: 1 % (ref 0–1)
Basophils Absolute: 0.08 k/uL
Eosinophils %: 1 % (ref 0.0–3.0)
Eosinophils Absolute: 0.05 k/uL
Hematocrit: 27.9 % — ABNORMAL LOW (ref 42.0–52.0)
Hemoglobin: 9.6 g/dL — ABNORMAL LOW (ref 13.5–18.0)
Immature Granulocytes %: 1 % — ABNORMAL HIGH
Immature Granulocytes Absolute: 0.05 k/uL
Lymphocytes %: 4 % — ABNORMAL LOW (ref 24–44)
Lymphocytes Absolute: 0.4 k/uL
MCH: 30.2 pg (ref 27.0–31.0)
MCHC: 34.4 g/dL (ref 32.0–36.0)
MCV: 87.7 fL (ref 78.0–100.0)
MPV: 9.9 fL (ref 7.5–11.1)
Monocytes %: 7 % (ref 0–9)
Monocytes Absolute: 0.7 k/uL
Neutrophils %: 88 % — ABNORMAL HIGH (ref 36–66)
Neutrophils Absolute: 9.1 k/uL
Platelets: 296 k/uL (ref 140–440)
RBC: 3.18 m/uL — ABNORMAL LOW (ref 4.60–6.20)
RDW: 14.6 % (ref 11.7–14.9)
WBC: 10.4 k/uL (ref 4.0–10.5)

## 2024-10-13 LAB — COMPREHENSIVE METABOLIC PANEL
ALT: 14 U/L (ref 10–40)
AST: 18 U/L (ref 15–37)
Albumin/Globulin Ratio: 1.1 (ref 1.1–2.2)
Albumin: 2.5 g/dL — ABNORMAL LOW (ref 3.4–5.0)
Alkaline Phosphatase: 115 U/L (ref 40–129)
Anion Gap: 11 mmol/L (ref 9–17)
BUN: 49 mg/dL — ABNORMAL HIGH (ref 7–20)
CO2: 25 mmol/L (ref 21–32)
Calcium: 8 mg/dL — ABNORMAL LOW (ref 8.3–10.6)
Chloride: 100 mmol/L (ref 99–110)
Creatinine: 4.5 mg/dL — ABNORMAL HIGH (ref 0.8–1.3)
Est, Glom Filt Rate: 13 mL/min/1.73m2 — ABNORMAL LOW (ref >60)
Glucose: 159 mg/dL — ABNORMAL HIGH (ref 74–99)
Potassium: 3.7 mmol/L (ref 3.5–5.1)
Sodium: 136 mmol/L (ref 136–145)
Total Bilirubin: 0.4 mg/dL (ref 0.0–1.0)
Total Protein: 4.8 g/dL — ABNORMAL LOW (ref 6.4–8.2)

## 2024-10-13 LAB — POCT GLUCOSE
POC Glucose: 139 mg/dL — ABNORMAL HIGH (ref 74–99)
POC Glucose: 168 mg/dL — ABNORMAL HIGH (ref 74–99)
POC Glucose: 124 mg/dL — ABNORMAL HIGH (ref 74–99)

## 2024-10-13 LAB — MAGNESIUM: Magnesium: 1.7 mg/dL — ABNORMAL LOW (ref 1.8–2.4)

## 2024-10-13 LAB — SURGICAL PATHOLOGY REPORT

## 2024-10-13 LAB — PHOSPHORUS: Phosphorus: 4.6 mg/dL (ref 2.5–4.9)

## 2024-10-13 MED ORDER — CHLORHEXIDINE GLUCONATE 4 % EX SOLN
4 | Freq: Once | CUTANEOUS | Status: AC
Start: 2024-10-13 — End: 2024-10-13
  Administered 2024-10-13: 11:00:00 118 via TOPICAL

## 2024-10-13 MED ORDER — PANTOPRAZOLE SODIUM 40 MG PO TBEC
40 | ORAL_TABLET | Freq: Two times a day (BID) | ORAL | 1 refills | 60.00000 days | Status: AC
Start: 2024-10-13 — End: ?

## 2024-10-13 MED ORDER — INSULIN LISPRO 100 UNIT/ML IJ SOLN
100 | Freq: Four times a day (QID) | INTRAMUSCULAR | 1 refills | 50.00000 days | Status: AC
Start: 2024-10-13 — End: ?

## 2024-10-13 MED ORDER — MAGNESIUM SULFATE 4000 MG/100 ML IVPB PREMIX
4 | Freq: Once | INTRAVENOUS | Status: AC
Start: 2024-10-13 — End: 2024-10-13
  Administered 2024-10-13: 15:00:00 4000 mg via INTRAVENOUS

## 2024-10-13 MED FILL — ASPIRIN 81 MG PO CHEW: 81 mg | ORAL | Qty: 1 | Fill #0

## 2024-10-13 MED FILL — ACETAMINOPHEN 325 MG PO TABS: 325 mg | ORAL | Qty: 2 | Fill #0

## 2024-10-13 MED FILL — PANTOPRAZOLE SODIUM 40 MG IV SOLR: 40 mg | INTRAVENOUS | Qty: 40 | Fill #0

## 2024-10-13 MED FILL — MAGNESIUM SULFATE 4 GM/100ML IV SOLN: 4 GM/100ML | INTRAVENOUS | Qty: 100 | Fill #0

## 2024-10-13 MED FILL — CHLORHEXIDINE GLUCONATE 4 % EX SOLN: 4 % | CUTANEOUS | Qty: 120 | Fill #0

## 2024-10-13 NOTE — Care Coordination (Signed)
"  Pt on discharge.  CM set stretcher transportation with [facility] for 1330 to return to [facility].    [employer] updated  [employer] updated.  Pt updated.   "

## 2024-10-13 NOTE — Progress Notes (Signed)
"  RR-RN asked to assist with foley placement r/t resistance per charge RN. Urology confirmed to place foley rather than straight cath as patient had foley inadvertently removed last evening. 38fr silicone foley with urometer placed utilizing sterile protocol without difficulty x1 attempt. Patient tolerated well and cloudy yellow urine returned. Foley secured to thigh with securing device in kit. Primary RN and charge RN updated. Please don't hesitate to call or utilize [employer] for Rapid Resource RN should any further needs or concerns arise.        Penne Close   Rapid Resource RN   [facility] [phone]    "

## 2024-10-13 NOTE — Progress Notes (Signed)
"    Physician Progress Note      PATIENT:               Joe Avery, Joe Avery  CSN #:                  [id number]  DOB:                       03/08/1961  ADMIT DATE:       10/08/2024 12:16 PM  DISCH DATE:        10/13/2024 1:50 PM  RESPONDING  PROVIDER #:        Murray CINDERELLA Blanch MD          QUERY TEXT:    Gastrointestinal bleeding is documented in the H&P on 11/29 and subsequent   documentation.  Please specify the underlying cause:    The clinical indicators include:  --63 yo M admitted with melena. pmh: AKI/ATN, urinary retention, s/p left AKA,   type 2 diabetes mellitus    --Discharge summary on 12/4: Underwent EGD.--with no bleeding source found.   Discussed with GI.  Underwent colonoscopy notable for polyp that was excised   will need to follow-up with GI in office. Biopsy results.  Also noted to have   nonbleeding hemorrhoids.    --H&P on 11/29: Hemoglobin was found to be 5.2 g/dL in the ED, patient pale   and lethargic.    --GI consult, EGD, colonoscopy, anticoagulant held, 5 units PRBC's, IVF, IV   Protonix   Options provided:  -- GI bleeding related to colon polyp  -- GI bleeding related to anticoagulant use  -- Other - I will add my own diagnosis  -- Disagree - Not applicable / Not valid  -- Refer to Clinical Documentation Reviewer    PROVIDER RESPONSE TEXT:    This patient has GI bleeding related to anticoagulant use    Query created by: Connye Lye on 10/17/2024 11:57 AM      Electronically signed by:  Murray CINDERELLA Blanch MD 10/17/2024 2:10 PM          "

## 2024-10-13 NOTE — Progress Notes (Signed)
 "    [address]   [place], [place]  [address]   Progress Note  Joe Avery [id number]      Date: 10/13/2024   Patient: Joe Avery   DOB: 05-14-1961   DOA: 10/08/2024   MRN: [id number]   ROOM#: 3127/3127-A     Admit Date: 10/08/2024     Collaborating Urologist on Call at time of admission: Dr. Ulysess   CC: Melena   Reason for Consult: Urine retention, AKI     Subjective:     Pain: minimal, no nausea and no vomiting  Bowel Movement/Flatus: Yes  Voiding: Indwelling catheter with yellow urine    Pt resting in bed, reports feeling OK today, had painful retention overnight and Foley replaced    Objective:    Vitals:   BP 136/82   Pulse 94   Temp 98.3 F (36.8 C) (Oral)   Resp 18   Ht 1.753 m (5' 9)   Wt 52.6 kg (116 lb)   SpO2 92%   BMI 17.13 kg/m   Temp  Avg: 98.1 F (36.7 C)  Min: 97.6 F (36.4 C)  Max: 98.7 F (37.1 C)    Intake/Output Summary (Last 24 hours) at 10/13/2024 0918  Last data filed at 10/13/2024 0603  Gross per 24 hour   Intake 349.65 ml   Output 1850 ml   Net -1500.35 ml       Physical Exam:   Gen: Pleasant male, in NAD, chronically ill appearing  Neuro: Non-focal  Resp: Unlabored breathing  Abd: Soft, non-distended, non-tender to palpation, no rebound  Back:   No CVAT  GU:  Indwelling catheter with yellow urine  Ext: No edema of bilateral LEs, left AKA    Labs:  WBC:    Lab Results   Component Value Date/Time    WBC 10.4 10/13/2024 04:56 AM     Hemoglobin/Hematocrit:    Lab Results   Component Value Date/Time    HGB 9.6 10/13/2024 04:56 AM    HCT 27.9 10/13/2024 04:56 AM     BMP:   Lab Results   Component Value Date/Time    NA 136 10/13/2024 04:56 AM    K 3.7 10/13/2024 04:56 AM    CL 100 10/13/2024 04:56 AM    CO2 25 10/13/2024 04:56 AM    BUN 49 10/13/2024 04:56 AM    LABALBU SPECIMEN QUANTITY NOT SUFFICIENT 06/11/2023 02:51 PM    CREATININE 4.5 10/13/2024 04:56 AM    CALCIUM  8.0 10/13/2024 04:56 AM    LABGLOM 13 10/13/2024 04:56 AM       Imaging:  CT ABDOMEN PELVIS WO CONTRAST  Additional Contrast? None  Result Date: 10/09/2024  IMPRESSION: 1.  Mild bilateral hydronephrosis. Prominent distention of the urinary bladder. Findings could be related to urinary retention. Recommend clinical correlation and follow-up. 2.  Diffuse mesenteric and body wall edema, suggestive of anasarca. 3.  Large bilateral pleural effusions. This dictation was created with voice recognition software.  While attempts have  been made to review the dictation as it is transcribed, on occasion the spoken word can be misinterpreted by the technology leading to omissions or inappropriate words, phrases or sentences.  Dictated and Electronically Signed By: Iantha Bumpers, MD [facility] Reading Station:  [facility]  10/08/2024 22:36        US  PELVIS LIMITED  Result Date: 10/08/2024  US  PELVIS LIMITED  DATE OF SERVICE:  10/08/2024 17:57 CLINICAL INDICATION: bladder to verify retention as requested by urology team  PATIENT INFORMATION: Bladder distention COMPARISON: None TECHNIQUE:  Real-time imaging of the urinary bladder FINDINGS:  Bladder contour is preserved. The bladder is distended. Estimated at 770 mL fluid. No filling defects or wall thickening. IMPRESSION: Distended urinary bladder.  Dictated and Electronically Signed By: Chyrl Deters, DO [facility] Reading Station:  [facility]  10/08/2024 20:47          Assessment & Plan:      Joe Avery is a 63 y.o. male with PMHx of T2DM, HTN, CKD, COPD, recent left common femoral artery occlusion s/p direct thrombolysis with later left iliac thrombectomy, posterior tibial and dorsalis pedis thrombectomy, patch angioplasty, developed compartment syndrome and underwent fasciotomy, then ultimately underwent left AKA on 11/12 at [facility], recent acute renal failure suspected to be secondary to rhabdomyolysis requiring dialysis, and urine retention s/p Foley placement via cystoscopy 09/13/2024 requiring cystoscopy due to false passage and proximal  bulbar stricture admitted 10/08/2024 for melanotic stools and anemia secondary to blood loss.     1) Urine retention: 52F Foley placed at bedside returning ~1L of amber urine.  Maintain Foley at this time, at least until renal function normalizes and no longer needing diuresis.   Foley was removed yesterday and patient again retaining with difficult Foley placement this morning. Do not remove Foley catheter. Outpatient follow up for further urologic work up. May ultimately need SPT placement but holding off at this time until further work up can be done. Our office will call to schedule this.     2) AKI on CKD: With recent history of acute renal failure with oliguria requiring dialysis.                Creatinine 4.5.  Nephrology is following.    Patient seen and examined, chart reviewed.   Electronically signed by Millard CHRISTELLA Atkinson, PA on 10/13/2024 at 9:18 AM  "

## 2024-10-13 NOTE — Op Note (Signed)
"   IR    Removed tunneled left HD cath without complication.  No images.    Report to follow  "

## 2024-10-13 NOTE — Progress Notes (Deleted)
"               [facility]             Cinderella Keel, MD      Gerri Schlatter, MD      Devera Blanch, APRN              Niels Ni, APRN-CNP             [address] [place] [address] [place], [place] [address]             [phone] fax [phone]    Gastroenterology Progress note 10/13/2024  Reason for consult: GI bleeding    Interval H/P  No bloody BM today  HGB 9.6  Colonoscopy negative for bleeding  Will continue to trend H&H while inpatient  If no overt signs of bleeding, will sign off          Recent Labs     10/10/24  2354 10/12/24  0912 10/13/24  0456   AST 28 24 18    ALT 17 18 14    ALKPHOS 101 122 115   BILITOT 0.5 0.3 0.4     Lab Results   Component Value Date    WBC 10.4 10/13/2024    HGB 9.6 (L) 10/13/2024    HCT 27.9 (L) 10/13/2024    MCV 87.7 10/13/2024    PLT 296 10/13/2024    LYMPHOPCT 4 (L) 10/13/2024    RBC 3.18 (L) 10/13/2024    MCH 30.2 10/13/2024    MCHC 34.4 10/13/2024    RDW 14.6 10/13/2024     Physical Exam   Blood pressure 136/82, pulse 94, temperature 98.3 F (36.8 C), temperature source Oral, resp. rate 18, height 1.753 m (5' 9), weight 52.6 kg (116 lb), SpO2 92%.  Constitutional: Patient is in no distress.   Eyes: Pupils equal, round.  No icterus.  HENT: Oral mucosa is moist.   Pulmonary: No accessory muscle use. No localizing pulmonary findings.     Cardiovascular: Heart has a regular rate and rhythm, no JVD.   Gastrointestinal: Bowel sounds are present in all four quadrants. Abdomen is soft, nontender, nondistended. Rectal examination deferred.   Skin: No jaundice, spider angiomas, or palmar erythema. No purpura.  Neuro: Awake,alert, and oriented x3. No gross focal neurologic deficits.    Chart and labs reviewed.    Impression/ Plan:    #1 GI bleeding.  Patient presented with dark red blood per rectum.  On Eliquis.  EGD negative.  Colonoscopy - showing nonbleeding hemorrhoids, no active bleeding. One 6mm sessile polyp removed.  Hemoglobin currently 9.6.      #2 history of tubular adenoma.  One 6mm sessile polyp removed.  Repeat colonoscopy in 5 years for surveillance.     3.  Anasarca and urinary retention.  Nephrology following.    Patient's history, exam, assessment and plan of care were discussed and decided in collaboration with Dr. MD.     Thank you for allowing us  to be involved in the management of this patient. We will follow this patient along with you. Please feel free to call with any questions.      Aloha Milder, APRN - CNP  Gastroenterology   10/13/2024   10:10 AM    "

## 2024-10-13 NOTE — Progress Notes (Signed)
"  Discharge Note.  Patient Joe Avery adequate for discharge at this time, is in no distress. Vitals remains WNL and foley cath in place and draining. Denis pain at the Left AKA stump, sutures intact without no signs of infection. Report called to [facility].  1355: Left unit with [facility] personnel.   "

## 2024-10-13 NOTE — Progress Notes (Signed)
"  Pt arrived to [facility].  Consent obtained. Tunneled hemodialysis cathter removed by Dr. Windell. Catheter tip intact Vitals WNL. Dressing WNL.Report called to Ensa, pt's bedside RN. Transportation will assist pt back to room.    "

## 2024-10-13 NOTE — Discharge Summary (Signed)
 "    V2.0  Discharge Summary    Name:  Joe Avery DOB/Age/Sex: [birth year] (63 y.o. male)   Admit Date: 10/08/2024  Discharge Date: 10/13/24    MRN & CSN:  [id number] & [id number] Encounter Date and Time 10/13/24 11:35 AM EST    Attending:  Tobie Murray GRADE, MD Discharging Provider: Murray GRADE Tobie, MD       Hospital Course:     Brief HPI: Joe Avery is a 63 y.o. male with pmh of AKI/ATN, urinary retention, s/p left AKA, type 2 diabetes mellitus, who presents with melanotic stool from nursing home and was found to be significantly anemic with hemoglobin down to 5.2 g/dL.  Patient appears pale and lethargic.  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  He was on full dose anticoagulation due to thromboembolic etiology.  And has been on Eliquis.     Brief Problem Based Course:     Anemia  Melena  -Presenting complaint-melanotic stool noted in the facility  - Hemoglobin was found to be 5.2 g/dL in the ED, patient pale and lethargic  - Holding anticoagulation  - He required total 5 units of PRBCs during the course of hospitalization  -Underwent EGD.--with no bleeding source found  - Discussed with GI.  Underwent colonoscopy notable for polyp that was excised will need to follow-up with GI in office.  Biopsy results.  Also noted to have nonbleeding hemorrhoids.  - Aspirin  resumed.  Discussed with GI prior to discharge.  Plan for capsule endoscopy as outpatient.     AKI on hemodialysis  Anasarca  -Recent admission was complicated by AKI on ATN in the setting of rhabdomyolysis and shock, needed to be started on hemodialysis, tunneled dialysis catheter was placed on 09/27/2024, patient continued to remain oliguric and required intermittent hemodialysis.  -Nephrology on board was given IV diuretics and renal function improved.  Creatinine slowly improving lots of urine output does not need long-term dialysis at this time, dialysis  catheter removed prior to discharge.     Urinary retention  - Patient found to have significant urinary retention, PVR showed 998 mL volume  - Patient appears to have had urinary retention on recent admission as well and had difficult Foley placement.  Now with good urine output  - S/p Foley placement by urology.  Will need urodynamic studies as outpatient and may need suprapubic catheter placement as outpatient.  Communicated to SNF doctor via COC document to book urology appointment within 2 weeks.  Keep Foley in place until urology appointment.       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  Started on aspirin  after endoscopy results were  discussed with cardiology given GI bleeding requiring multiple units of transfusions okay to continue with aspirin  alone at this time and does not need anticoagulation.  Plan for follow-up in office for reassessment.     Type 2 diabetes mellitus  - Management sign scale insulin .      The patient expressed  appropriate understanding of, and agreement with the discharge recommendations, medications, and plan.     Consults this admission:  IP CONSULT TO GI  IP CONSULT TO NEPHROLOGY  IP CONSULT TO UROLOGY  IP CONSULT TO GI  IP CONSULT TO CARDIOLOGY  IP CONSULT TO INTERVENTIONAL RADIOLOGY    Discharge Diagnosis:   As above    Discharge Instruction:   Follow up appointments: PCP, nephrology, GI, cardiology, urology  Primary care physician: Fernand Ruffing, MD within 1 week  Diet: regular diet   Activity: activity as tolerated  Disposition: Discharged to:   [] Home, [] HHC, [x] SNF, [] Acute Rehab, [] Hospice   Condition on discharge: Stable  Labs and Tests to be Followed up as an outpatient by PCP or Specialist:   Urology office follow-up within 2 weeks for urodynamic studies and placement of suprapubic catheter if indicated.  Do not remove Foley until urology appointment.  GI office follow-up within 2 weeks for capsule endoscopy.  Consider hematology workup as outpatient if anemia persists and no source of GI bleeding noted.  Cardiology office follow-up within 2 weeks.    Discharge Medications:        Medication List        START taking these medications      atorvastatin  40 MG tablet  Commonly known as: LIPITOR   Take 1 tablet by mouth nightly     insulin  lispro 100 UNIT/ML Soln injection vial  Commonly known as: HUMALOG ,ADMELOG   Inject 0-4 Units into the skin 4 times daily (before meals and nightly) **Corrective Low Dose Algorithm** Glucose: Dose: 70-179 No Insulin  180-249 1 Unit 250-299 2 Units 300-349 3 Units Over 349 4 Units and notify physician Administer as soon as possible within 60 minutes of last blood glucose check     pantoprazole  40 MG tablet  Commonly known as: PROTONIX   Take 1 tablet by mouth in the morning and at bedtime            CONTINUE taking these medications      aspirin  81 MG chewable tablet  Take 1 tablet by mouth daily     Glucagon  3 MG/DOSE Powd     polyethylene glycol 17 g packet  Commonly known as:  GLYCOLAX             STOP taking these medications      amitriptyline 10 MG tablet  Commonly known as: ELAVIL     amLODIPine  10 MG tablet  Commonly known as: NORVASC      apixaban 5 MG Tabs tablet  Commonly known as: ELIQUIS     carvedilol 6.25 MG tablet  Commonly known as: COREG     EXCEDRIN PO     gabapentin  100 MG capsule  Commonly known as: NEURONTIN      lisinopril  40 MG tablet  Commonly known as: PRINIVIL ;ZESTRIL      metFORMIN 500 MG tablet  Commonly known as: GLUCOPHAGE     mirtazapine 15 MG tablet  Commonly known as: REMERON     oxyCODONE 5 MG immediate release tablet  Commonly known as: ROXICODONE     RANITIDINE 150 MAX STRENGTH PO  Where to Get Your Medications        Information about where to get these medications is not yet available    Ask your nurse or doctor about these medications  insulin  lispro 100 UNIT/ML Soln injection vial  pantoprazole  40 MG tablet        Objective Findings at Discharge:   BP (!) 144/88   Pulse (!) 102   Temp 98.3 F (36.8 C) (Oral)   Resp 22   Ht 1.753 m (5' 9)   Wt 52.6 kg (116 lb)   SpO2 96%   BMI 17.13 kg/m       Physical Exam:     General: NAD  Eyes: EOMI  ENT: neck supple  Cardiovascular: Regular rate.  Respiratory: Clear to auscultation  Gastrointestinal: Soft, non tender  Genitourinary: no suprapubic tenderness.  Foley catheter in place  Musculoskeletal: No edema  Skin: warm, dry  Neuro: Alert.  Oriented to time place person.  Psych: Mood appropriate.         Labs and Imaging   Colonoscopy  Result Date: 10/12/2024  [facility] Patient: KIMMY, PARISH MRN: [id number] DOB: [birth year] Sex at Birth: Male Age: 60 Years Procedure: Colonoscopy Date: 10/12/2024 Attending Physician: Cinderella Keel Indications:        -  Hematochezia        -  Anemia Medications:        -  See the Anesthesia note for documentation of the administered           medications Complications:        -  No immediate complications. Estimated Blood Loss:        -  Estimated  blood loss was minimal. Procedure:        - The PCF H190L [id number] was introduced through the anus and           advanced to the cecum, identified by appendiceal orifice and           ileocecal valve.        -  The colonoscopy was performed without difficulty.        -  The patient tolerated the procedure well.        -  The quality of the bowel preparation was adequate to identify           polyps greater than 5 mm in size.        -  The ileocecal valve, appendiceal orifice, and rectum were           photographed. Findings:        -  The perianal and digital rectal examinations were normal.        -  A 6 mm polyp was found in the sigmoid colon.  The polyp was           sessile.  The polyp was removed with a cold snare.   Resection and           retrieval were complete.        -  Internal non-bleeding hemorrhoids were found during endoscopy.           The hemorrhoids were Grade II (internal hemorrhoids that prolapse but           reduce spontaneously).        -  The exam was otherwise without abnormality on direct and           retroflexion views. Impression:        -  One 6 mm polyp in the sigmoid colon, removed with a cold snare.           Resected and retrieved.        -  Internal non-bleeding hemorrhoids.        -  The examination was otherwise normal on direct and retroflexion           views. Recommendation:        -  Patient has a contact number available for emergencies.  The           signs and symptoms of potential delayed complications were discussed           with the patient.  Return to normal activities tomorrow.  Written           discharge instructions were provided to the patient.        -  Await pathology results.        -  Telephone GI clinic for pathology results in 1 week.        -  Repeat colonoscopy in 5 years for surveillance.        -  Return patient to hospital ward for ongoing care.        -  Resume regular diet today. Procedure Code(s):        - [id number], Colonoscopy, flexible; with  removal of tumor(s), polyp(s),           or other lesion(s) by snare technique Diagnosis Code(s):        - K92.1, Melena (includes Hematochezia)        - D64.9, Anemia, unspecified        - D12.5, Benign neoplasm of sigmoid colon        - K64.1, Second degree hemorrhoids CPT(R) - 2023 copyright American Medical Association. All Rights Reserved.       The CPT codes, CCI edits and ICD codes generated are intended as       suggestions and were generated based on input data.  These codes are       preliminary and upon coder review may be revised to meet current       compliance and payer requirements.  The provider is responsible for       the final determination of appropriate codes, and modifiers. Scope Withdrawal Time:       00:08:30 AHMED EDHI Ahmed Edhi This document has been electronically signed. Note Initiated:10/12/2024 Note Completed:10/12/2024 7:59 AM    EGD  Result Date: 10/10/2024  [facility] Patient: CRIXUS, MCAULAY MRN: [id number] DOB: Jun 08, 1961 Sex at Birth: Male Age: 77 Years Procedure: Upper GI endoscopy Date: 10/10/2024 Attending Physician: Cinderella Keel Indications:        -  Melena        -  Acute post hemorrhagic anemia Medications:        -  See the Anesthesia note for documentation of the administered           medications Complications:        -  No immediate complications. Estimated Blood Loss:        -  Estimated blood loss was minimal. Procedure:        - The scope was introduced through the mouth and advanced to the           second part of the duodenum.        -  The upper GI endoscopy was accomplished without difficulty.        -  The patient tolerated the procedure well. Findings:        -  The examined esophagus was normal.        -  The entire examined stomach was normal.        -  Localized moderate mucosal changes characterized by nodularity           were found in the duodenal bulb.  Biopsies were taken with a cold           forceps for histology.        -  The cardia  and gastric fundus were normal on retroflexion.        - No active bleeding noted. Impression:        -  Normal esophagus.        -  Normal stomach.        -  Mucosal changes in the duodenum.  Biopsied.        - No active bleeding noted. Recommendation:        -  Patient has a contact number available for emergencies.  The           signs and symptoms of potential delayed complications were discussed           with the patient.  Return to normal activities tomorrow.  Written           discharge instructions were provided to the patient.        -  Await pathology results.        -  Telephone GI clinic for pathology results in 1 week.        -  Perform a colonoscopy tomorrow. Procedure Code(s):        - [id number], Esophagogastroduodenoscopy, flexible, transoral; with           biopsy, single or multiple Diagnosis Code(s):        - K92.1, Melena (includes Hematochezia)        - D62, Acute posthemorrhagic anemia        - K31.89, Other diseases of stomach and duodenum CPT(R) - 2023 copyright American Medical Association. All Rights Reserved.       The CPT codes, CCI edits and ICD codes generated are intended as       suggestions and were generated based on input data.  These codes are       preliminary and upon coder review may be revised to meet current       compliance and payer requirements.  The provider is responsible for       the final determination of appropriate codes, and modifiers. Scope Withdrawal Time:       00:03:22 AHMED EDHI Ahmed Edhi This document has been electronically signed. Note Initiated:10/10/2024 Note Completed:10/10/2024 11:45 AM    CT ABDOMEN PELVIS WO CONTRAST Additional Contrast? None  Result Date: 10/09/2024  PROCEDURE: CT ABDOMEN PELVIS WO CONTRAST DATE OF EXAM:  10/08/2024 21:55 DEMOGRAPHICS: 63 years old Male INDICATION: Assess for hydronephrosis COMPARISON: No existing relevant imaging study corresponding to the same anatomical region is available. TECHNIQUE: Contiguous axial slices of the  abdomen and pelvis were submitted without IV administration of contrast. No oral contrast was utilized. Additional  coronal reformatted images were submitted.  DOSE OPTIMIZATION: CT radiation dose optimization techniques (automated exposure  control, and use of iterative reconstruction techniques, or adjustment of the mA and/or kV according to patient size) were used to limit patient radiation dose. FINDINGS: CT ABDOMEN: Inferior chest:  There are large bilateral pleural effusions. There is bibasilar  consolidation. The heart size is normal without pericardial effusion. Gallbladder fossa:  No focal abnormalities are seen. Biliary tree: There is no evidence for intra-or extrahepatic biliary ductal dilatation. Liver: The liver demonstrates normal appearance. No focal abnormalities are seen. Spleen: Normal size and morphology is seen. No masses are identified. Pancreas: Normal morphology without masses or inflammatory changes. Adrenals: Normal size without masses. Kidneys: There is mild bilateral hydronephrosis. Vasculature: The abdominal aorta is normal in caliber. There is mild atherosclerotic disease of the abdominal aorta. Lymphatic system: No pathologically enlarged lymph nodes are seen. Bowel: The stomach is normally distended with no focal wall abnormality. The duodenum is normal in caliber along its course. No focal abnormality is seen. The small bowel is normal caliber. There is no focal stricture or dilatation. The colon is normal in caliber. No focal abnormality is seen. The appendix is not definitively visualized. Peritoneal structures: There is diffuse mesenteric and body wall edema. There is  trace ascites.  Retroperitoneum: No focal retroperitoneal abnormality is seen. Abdominal wall:  The visualized portions of the abdominal wall are within normal  limits. CT PELVIS: Urinary bladder: There is prominent distention of the urinary bladder. There is no focal bladder wall abnormality. Soft tissues: There is  no pelvic adenopathy. The prostate is mildly enlarged. There is diffuse edema in the pelvic wall. Bones: No significant abnormalities in the bony pelvis. IMPRESSION: 1.  Mild bilateral hydronephrosis. Prominent distention of the urinary bladder. Findings could be related to urinary retention. Recommend clinical correlation and follow-up. 2.  Diffuse mesenteric and body wall edema, suggestive of anasarca. 3.  Large bilateral pleural effusions. This dictation was created with voice recognition software.  While attempts have  been made to review the dictation as it is transcribed, on occasion the spoken word can be misinterpreted by the technology leading to omissions or inappropriate words, phrases or sentences.  Dictated and Electronically Signed By: Iantha Bumpers, MD [facility] Reading Station:  [id number]  10/08/2024 22:36        US  PELVIS LIMITED  Result Date: 10/08/2024  US  PELVIS LIMITED  DATE OF SERVICE:  10/08/2024 17:57 CLINICAL INDICATION: bladder to verify retention as requested by urology team PATIENT INFORMATION: Bladder distention COMPARISON: None TECHNIQUE:  Real-time imaging of the urinary bladder FINDINGS:  Bladder contour is preserved. The bladder is distended. Estimated at 770 mL fluid. No filling defects or wall thickening. IMPRESSION: Distended urinary bladder.  Dictated and Electronically Signed By: Chyrl Deters, DO [facility] Reading Station:  [facility]  10/08/2024 20:47          CBC:   Recent Labs     10/11/24  1010 10/11/24  1514 10/12/24  0912 10/13/24  0456   WBC 7.7  --  11.3* 10.4   HGB 9.2* 9.1* 10.8* 9.6*   PLT 253  --  318 296     BMP:    Recent Labs     10/10/24  2354 10/12/24  0912 10/13/24  0456   NA 136 136 136   K 4.0 3.4* 3.7   CL 99 96* 100   CO2 21 28 25    BUN 66* 54* 49*   CREATININE 4.9* 4.7* 4.5*   GLUCOSE 108* 153* 159*     Hepatic:   Recent Labs     10/10/24  2354 10/12/24  0912 10/13/24  0456   AST 28 24 18    ALT 17 18 14  BILITOT 0.5 0.3  0.4   ALKPHOS 101 122 115     Lipids:   Lab Results   Component Value Date/Time    CHOL 127 12/13/2023 05:01 AM    HDL 38 12/13/2023 05:01 AM    TRIG 86 12/13/2023 05:01 AM     Hemoglobin A1C:   Lab Results   Component Value Date/Time    LABA1C 11.7 09/12/2024 09:00 AM     TSH:   Lab Results   Component Value Date/Time    TSH 0.46 09/12/2024 09:00 AM     UA:  Lab Results   Component Value Date/Time    NITRU NEGATIVE 10/09/2024 08:50 AM    COLORU Yellow 10/09/2024 08:50 AM    PHUR 8.5 10/09/2024 08:50 AM    LABCAST None seen 01/04/2024 10:05 AM    WBCUA 6 10/09/2024 08:50 AM    RBCUA 190 10/09/2024 08:50 AM    BACTERIA None 10/09/2024 08:50 AM    CLARITYU Clear 05/05/2024 10:29 AM    LEUKOCYTESUR NEGATIVE 10/09/2024 08:50 AM    UROBILINOGEN 0.2 10/09/2024 08:50 AM    BILIRUBINUR NEGATIVE 10/09/2024 08:50 AM    BLOODU Negative 05/05/2024 10:29 AM    GLUCOSEU NEGATIVE 10/09/2024 08:50 AM    KETUA NEGATIVE 10/09/2024 08:50 AM         Time Spent Discharging patient 42 minutes    Electronically signed by Murray CINDERELLA Blanch, MD on 10/13/2024 at 11:35 AM  "

## 2024-10-13 NOTE — Progress Notes (Signed)
 "Nephrology Progress Note  10/13/2024 8:34 AM        Subjective:   Admit Date: 10/08/2024  PCP: Fernand Ruffing, MD    Interval History: No major event that I am aware of    Diet: He is feeling hungry so hopefully can eat better he needs a lot of high-calorie nutrition    ROS: No confusion or shortness of breath, urine output dropped unless it was not recorded accurately still 1.9 L I did stop the loop, no fever and acceptable blood pressure    Data:     Current meds:    [Held by provider] furosemide   40 mg IntraVENous BID    aspirin   81 mg Oral Daily    [Held by provider] atorvastatin   40 mg Oral Nightly    insulin  lispro  0-4 Units SubCUTAneous 4x Daily AC & HS    pantoprazole   40 mg IntraVENous BID    sodium chloride  flush  5-40 mL IntraVENous 2 times per day      sodium chloride       dextrose       sodium chloride       sodium chloride            I/O last 3 completed shifts:  In: 359.7 [P.O.:300; I.V.:10; IV Piggyback:49.7]  Out: 4550 [Urine:4550]    CBC:   Recent Labs     10/11/24  1010 10/11/24  1514 10/12/24  0912 10/13/24  0456   WBC 7.7  --  11.3* 10.4   HGB 9.2* 9.1* 10.8* 9.6*   PLT 253  --  318 296          Recent Labs     10/10/24  2354 10/12/24  0912 10/13/24  0456   NA 136 136 136   K 4.0 3.4* 3.7   CL 99 96* 100   CO2 21 28 25    BUN 66* 54* 49*   CREATININE 4.9* 4.7* 4.5*   GLUCOSE 108* 153* 159*       Lab Results   Component Value Date    CALCIUM  8.0 (L) 10/13/2024    PHOS 4.6 10/13/2024       Objective:     Vitals: BP (!) 142/85   Pulse (!) 115   Temp 98 F (36.7 C) (Oral)   Resp 29   Ht 1.753 m (5' 9)   Wt 52.6 kg (116 lb)   SpO2 98%   BMI 17.13 kg/m ,    General appearance: Thin with sarcopenia without any acute distress  HEENT: Positive conjunctival pallor no gross scleral icterus  Neck: Left intrajugular tunneled Dialysis catheter  Lungs: No gross crackles  Heart: Tachycardia during my examination  Abdomen: Soft  Extremities: Right leg edema is better, left above-knee amputation he does have a  Foley catheter      Problem List :         Impression :     Stage III acute kidney disease mainly from rhabdomyolysis/compartment syndrome/acute bladder obstruction and renal vein congestion-recovering slowly remove the dialysis catheter  Acute bladder obstruction with underlying diabetes gastrointestinal bleeding malnutrition anemia fever and aftermath of stage III acute kidney disease    Recommendation/Plan  :     Remove tunneled dialysis catheter, okay to discharge from my standpoint.  He will need comprehensive metabolic panel magnesium  and phosphorus and complete blood count differential weekly x 4, follow-up with me in 10 to 12 days.  I can optimize therapy for other chronic condition.  He will  of course have to go with Foley catheter and close urology follow-up      Gatha Koleen Box, MD MD    "

## 2024-10-13 NOTE — Plan of Care (Signed)
"    Problem: Chronic Conditions and Co-morbidities  Goal: Patient's chronic conditions and co-morbidity symptoms are monitored and maintained or improved  Outcome: Adequate for Discharge     Problem: Discharge Planning  Goal: Discharge to home or other facility with appropriate resources  Outcome: Completed     Problem: Skin/Tissue Integrity  Goal: Skin integrity remains intact  Description: 1.  Monitor for areas of redness and/or skin breakdown  2.  Assess vascular access sites hourly  3.  Every 4-6 hours minimum:  Change oxygen saturation probe site  4.  Every 4-6 hours:  If on nasal continuous positive airway pressure, respiratory therapy assess nares and determine need for appliance change or resting period  Outcome: Adequate for Discharge     Problem: Safety - Adult  Goal: Free from fall injury  Outcome: Adequate for Discharge     "

## 2024-10-20 NOTE — Telephone Encounter (Signed)
"  Spoke with jennifer DON at [employer] and gave orders for weekly cmp and instructions to fax to our office   "

## 2024-10-21 NOTE — Progress Notes (Signed)
 [facility]  [address]   [address]  [place], [place]  [address]     Phone: [phone]  Fax: [phone] Norleen Candle, MD  Lynwood Keeler, MD  Jordan Sasson, MD  Kimberlee Seitz, MD  Aleene Burgess, MD  Corean Rubenstein, APRN  Harlene Haff, APRN  Medford Lather, PA  Geofm Eastern, APRN  Duwaine Greaser, APRN  Alan Hastings, APRN  [place], [place]  Lawler, APRN  Izetta Shin, APRN       PCP:  Fernand Ruffing  Referring Physician: No ref. 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 Felizardo for incision check and possible suture removal        History of Present Illness:     Vannak Montenegro is a 63 year old male who presents with [facility] for wound check and postsurgical hospitalization follow-up.  PMH includes HTN, HLD, current tobacco use, and T2DM.  Due to acute LLE ischemia complicated from compartment syndrome and rhabdomyolysis, on 11/4 he underwent a left iliac, PT, and DP thrombectomy, 4 compartment fasciotomy with Dr. Keeler.  Despite revascularization of limb, foot was unsalvageable and on 11/10 he underwent a left BK guillotine amputation with Dr. 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 Felizardo. He was discharged to [facility] in [place], [place].  He was hospitalized at [facility] 11/29 due to acute GI bleed, Eliquis was held at that time. He denies any fever/chills or drainage from amputation site.  Reports he was on dialysis but that has been discontinued. He has been compliant with Aspirin  81mg  and Atorvastatin  40mg  daily.     Review of Systems:   Review of Systems   Constitutional: Negative.  Negative for chills and fever.   HENT: Negative.     Eyes: Negative.    Respiratory: Negative.     Cardiovascular: Negative.  Negative for chest  pain and leg swelling.   Gastrointestinal: Negative.    Endocrine: Negative.    Genitourinary: Negative.    Musculoskeletal: Negative.    Skin:  Positive for wound. Negative for color change and rash.   Neurological: Negative.  Negative for weakness and numbness.   Hematological: Negative.    Psychiatric/Behavioral: Negative.         Physical Exam:   Vitals Signs:    Vitals:    10/21/24 1128   BP: 138/74   Pulse: 56   Temp: 98.2 F (36.8 C)   TempSrc: Temporal   SpO2: 98%       Physical Exam  Vitals and nursing note reviewed.   Constitutional:       General: He is not in acute distress.     Appearance: Normal appearance.      Comments: Foley catheter intact   Cardiovascular:      Rate and Rhythm: Normal rate.      Pulses:           Femoral pulses  are 2+ on the right side and 2+ on the left side.       Dorsalis pedis pulses are 2+ on the right side.        Posterior tibial pulses are 2+ on the right side.   Pulmonary:      Effort: Pulmonary effort is normal.   Abdominal:      Palpations: Abdomen is not rigid.   Musculoskeletal:      Left Lower Extremity: Left leg is amputated above knee.   Skin:     General: Skin is warm and dry.      Capillary Refill: Capillary refill takes less than 2 seconds.      Findings: Wound present.           Comments: Left groin incision healed  Left AKA intact with sutures.  No dehiscence, erythema, or drainage.  All sutures removed, Mepilex border dressing applied.   Neurological:      Mental Status: He is alert. Mental status is at baseline.   Psychiatric:         Attention and Perception: Attention normal.         Mood and Affect: Mood normal.         Speech: Speech normal.       Imaging:    Media Information    Document Information    Photographic Image: Clinical Photo   Left AKA   10/21/2024 11:39 AM     Assessment/Plan   Assessment and Plan:   Gillis Boardley is a 63 year old male with s/p 11/12 left AKA with Dr. Felizardo.  Bilateral femoral and right pedal signals palpable.  Left groin  incision healed.  Left AKA incision intact with sutures, no signs of infection.  All sutures removed, patient tolerated well.  Mepilex border dressing applied.  He is cleared for shrinker at this time and starting prosthesis process with [facility].  May follow-up with [facility] as needed.     Diagnosis Plan   1. S/P AKA (above knee amputation), left (HC CODE)        2. Encounter for removal of sutures              Follow-up:     Visit Disposition       Dispositions    Return if symptoms worsen or fail to improve.                Electronically signed by: Rollo CHRISTELLA Shin, CNP, 10/21/2024 11:58 AM              "

## 2024-10-21 NOTE — Nursing Note (Signed)
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 Felizardo for incision check and possible suture removal   "

## 2024-10-24 ENCOUNTER — Ambulatory Visit: Admit: 2024-10-24 | Discharge: 2024-10-24 | Payer: MEDICARE | Attending: Nephrology | Primary: Internal Medicine

## 2024-10-24 VITALS — BP 120/70 | HR 80

## 2024-10-24 DIAGNOSIS — N179 Acute kidney failure, unspecified: Principal | ICD-10-CM

## 2024-10-24 NOTE — Progress Notes (Signed)
 "    Patient:   Joe Avery    Date:  10/24/24  DOB:  [birth year], 63 y.o.   Nephrologist: Gatha Koleen Box, MD  Referring: No ref. provider found   PCP:             Fernand Ruffing, MD     Chief Complaint:      Follow-up after recent inpatient stay.    HISTORY OF PRESENT ILLNESS:   I saw Mr. Joe Avery during his inpatient stay back in November 2025 with dark tarry stool.  As he was able to recover his kidney function and after avoid dialysis, his tunneled catheter has been removed.  His nadir hemoglobin was 5.2 g/dL, he underwent total 5 units of packed red blood cell volume, his anticoagulation is on hold.  Also underwent colonoscopy which showed polyp and biopsy were done.  She also found to have nonbleeding hemorrhoid.  Additionally he had acute bladder obstruction, Foley was placed by urologist.  He was discharged with the Foley catheter and a plan to see urologist as soon as possible.    His last hemodialysis was on October 07, 2024.          Creatinine trend/ Kidney data :     Serum creatinine is available since July 2017 it was 1.3 mg/dL, creatinine remained in the vicinity of 1.2 to 1.3 mg/dL up until November 7974.  Creatinine peaked around 5.5 on September 17, 2024 by me assuming that is when the dialysis initiated.  Creatinine went down to 4.5 mg/dL on October 13, 2024-          Kidney  failure  risk Equation :           Heart/ cardiac data :         Two-dimensional transthoracic echocardiogram done on September 12, 2024      Image quality is poor, patient screaming in pain during exam.    Left Ventricle: Normal left ventricular systolic function with a visually estimated EF of 55 - 60%. Left ventricle is smaller than normal. Normal wall thickness.  LVIDd is 3.1 cm. Normal wall motion.    Left Atrium: Left atrium is smaller than normal.    Right Ventricle: Normal systolic function.    Aortic Valve: Individual aortic valve leaflets not well visualized.    Pericardium: No pericardial effusion.     Kidney  imaging :            Adrenals: Normal size without masses.     Kidneys: There is mild bilateral hydronephrosis.     Urinary bladder: There is prominent distention of the urinary bladder. There is   no focal bladder wall abnormality.        Prevent score :          Trend of proteinuria :                 lipid panel :            Diabetic data  :        Mineral bone disorder data :          Anemia and other cell lines data :          Health maintenance / age-appropriate screening  data :           Vaccination :          Vascular access :            Past medical  history :         Stage III acute kidney disease in November 2025 requiring maintenance dialysis-last hemodialysis was on October 07, 2024  Severe peripheral arterial disease with compartment syndrome necessitating left above-knee amputation  Longstanding diabetes mellitus  Presume episode of transient ischemic attack  History of high blood pressure            Past surgical history :     Left above-knee amputation  Left intrajugular tunneled cuffed dialysis catheter           Habits :      He has been smoking since age 62 under lately smoking only 3 to 4 cigarettes a day, denied any history of alcohol use or drug use     Social history :   He was born in [place]  next to [facility].  Apparently only moved to [place]  not too long ago.  He was living alone up until the illness in November and currently residing at [facility].  Does not look like he has much family here he is not married and has no children      Family medical history  :         Partly mom died while sleeping in her 73s of unknown etiology, no family history of chronic kidney disease                   No Known Allergies     Current Outpatient Medications   Medication Instructions    acetaminophen  (TYLENOL ) 500 mg, DAILY    aspirin  81 mg, Oral, DAILY    atorvastatin  (LIPITOR ) 40 mg, Oral, NIGHTLY    Glucagon  3 mg, PRN    insulin  lispro (HUMALOG ,ADMELOG ) 0-4 Units, SubCUTAneous, 4 TIMES  DAILY BEFORE MEALS & NIGHTLY, **Corrective Low Dose Algorithm** Glucose: Dose: 70-179 No Insulin  180-249 1 Unit 250-299 2 Units 300-349 3 Units Over 349 4 Units and notify physician Administer as soon as possible within 60 minutes of last blood glucose check    pantoprazole  (PROTONIX ) 40 mg, Oral, 2 times daily    polyethylene glycol (GLYCOLAX ) 17 g, DAILY        REVIEW OF SYSTEMS:   Constitutional:     ROS are negative expect for the following  :     BP 120/70   Pulse 80     @weight @    PHYSICAL EXAM:  General appearance: Well pallor no scleral icterus, he is in a wheelchair  Head: Normocephalic, without obvious abnormality, atraumatic  Neck: Supple  Heart: Regular rate and rhythm  LUNGS: No crackles  Abdomen: Soft  Extremities: No gross right leg edema left above-knee amputation    He does have a Foley catheter    LABS:  BMP:    Lab Results   Component Value Date/Time    NA 136 10/13/2024 04:56 AM    NA 136 10/12/2024 09:12 AM    NA 136 10/10/2024 11:54 PM    K 3.7 10/13/2024 04:56 AM    K 3.4 10/12/2024 09:12 AM    K 4.0 10/10/2024 11:54 PM    CL 100 10/13/2024 04:56 AM    CL 96 10/12/2024 09:12 AM    CL 99 10/10/2024 11:54 PM    CO2 25 10/13/2024 04:56 AM    CO2 28 10/12/2024 09:12 AM    CO2 21 10/10/2024 11:54 PM    BUN 49 10/13/2024 04:56 AM    BUN 54 10/12/2024 09:12 AM    BUN 66  10/10/2024 11:54 PM    CREATININE 4.5 10/13/2024 04:56 AM    CREATININE 4.7 10/12/2024 09:12 AM    CREATININE 4.9 10/10/2024 11:54 PM    GLUCOSE 159 10/13/2024 04:56 AM    GLUCOSE 153 10/12/2024 09:12 AM    GLUCOSE 108 10/10/2024 11:54 PM         IMPRESSION:    1. AKI (acute kidney injury)    Mainly from tubular injury  He is recovering and his last dialysis was on October 07, 2024 he has Foley catheter making urine    2. CKD stage G3a/A1, GFR 45-59 and albumin creatinine ratio <30 mg/g (HCC)  Will get the labs done    3. ASCVD (arteriosclerotic cardiovascular disease)  Risk reduction        PLAN:    He will need to follow-up with  urologist, lab including comprehensive metabolic panel as well as chronic kidney disease lab    His edema much improved, will get some information about daily and output, also make sure he does see urologist as soon as possible.  He needs to improve his nutrition as his albumin was low I will coordinate with nursing home nutritionist    I will bring him back in 3 weeks           "

## 2024-10-25 NOTE — Telephone Encounter (Signed)
"  Patient scheduled for urology follow-up December 23 8:50 AM at [facility].  Stacy at [facility] notified and stated understanding  "

## 2024-11-10 LAB — COMPREHENSIVE METABOLIC PANEL
Albumin: 3.6 g/dL
BUN: 25 mg/dL
Creatinine: 1.3 mg/dL
Est, Glom Filt Rate: 68
Glucose: 275 mg/dL
Potassium: 4.7 mmol/L
Sodium: 137 mmol/L

## 2024-11-10 LAB — CBC: Hemoglobin: 6.7 g/dL — AB (ref 13.5–17.5)

## 2024-11-15 ENCOUNTER — Encounter: Payer: MEDICARE | Attending: Nephrology | Primary: Internal Medicine

## 2024-11-15 NOTE — Progress Notes (Deleted)
 Patient:   Joe Avery    Date:  11/15/24  DOB:  November 25, 1960, 64 y.o.   Nephrologist: Gatha Koleen Box, MD  Referring: No ref. provider found   PCP:             Fernand Ruffing, MD     Chief Complaint:    Follow-up after recent stage III acute kidney injury    HISTORY OF PRESENT ILLNESS:   ***    Creatinine trend/ Kidney data :     Serum creatinine is available since July 2017 it was 1.3 mg/dL, creatinine remained in the vicinity of 1.2 to 1.3 mg/dL up until November 7974.  Creatinine peaked around 5.5 on September 17, 2024 by me assuming that is when the dialysis initiated.  Creatinine went down to 4.5 mg/dL on October 13, 2024-              Kidney  failure  risk Equation :              Heart/ cardiac data :         Two-dimensional transthoracic echocardiogram done on September 12, 2024      Image quality is poor, patient screaming in pain during exam.    Left Ventricle: Normal left ventricular systolic function with a visually estimated EF of 55 - 60%. Left ventricle is smaller than normal. Normal wall thickness.  LVIDd is 3.1 cm. Normal wall motion.    Left Atrium: Left atrium is smaller than normal.    Right Ventricle: Normal systolic function.    Aortic Valve: Individual aortic valve leaflets not well visualized.    Pericardium: No pericardial effusion.     Kidney imaging :            Adrenals: Normal size without masses.     Kidneys: There is mild bilateral hydronephrosis.     Urinary bladder: There is prominent distention of the urinary bladder. There is   no focal bladder wall abnormality.           Prevent score :              Trend of proteinuria :                        lipid panel :                Diabetic data  :           Mineral bone disorder data :             Anemia and other cell lines data :             Health maintenance / age-appropriate screening  data :               Vaccination :              Vascular access :              Past medical history :         Stage III acute kidney disease in  November 2025 requiring maintenance dialysis-last hemodialysis was on October 07, 2024  Severe peripheral arterial disease with compartment syndrome necessitating left above-knee amputation  Longstanding diabetes mellitus  Presume episode of transient ischemic attack  History of high blood pressure            Past surgical history :     Left above-knee amputation  Left intrajugular tunneled cuffed  dialysis catheter           Habits :      He has been smoking since age 35 under lately smoking only 3 to 4 cigarettes a day, denied any history of alcohol use or drug use     Social history :   He was born [place]  next to [facility].  Apparently only moved to [place]  not too long ago.  He was living alone up until the illness in November and currently residing at [facility].  Does not look like he has much family here he is not married and has no children      Family medical history  :         Partly mom died while sleeping in her 61s of unknown etiology, no family history of chronic kidney disease               No Known Allergies     Current Outpatient Medications   Medication Instructions    acetaminophen  (TYLENOL ) 500 mg, DAILY    aspirin  81 mg, Oral, DAILY    atorvastatin  (LIPITOR ) 40 mg, Oral, NIGHTLY    Glucagon  3 mg, PRN    insulin  lispro (HUMALOG ,ADMELOG ) 0-4 Units, SubCUTAneous, 4 TIMES DAILY BEFORE MEALS & NIGHTLY, **Corrective Low Dose Algorithm** Glucose: Dose: 70-179 No Insulin  180-249 1 Unit 250-299 2 Units 300-349 3 Units Over 349 4 Units and notify physician Administer as soon as possible within 60 minutes of last blood glucose check    pantoprazole  (PROTONIX ) 40 mg, Oral, 2 times daily    polyethylene glycol (GLYCOLAX ) 17 g, DAILY        REVIEW OF SYSTEMS:   Constitutional:     ROS are negative expect for the following  : ***    There were no vitals taken for this visit.  BP ***  @weight @    PHYSICAL EXAM:  General appearance: ***  Head: Normocephalic, without obvious abnormality,  atraumatic  Neck:  ***  Heart: ***  LUNGS: ***  Abdomen: ***  Extremities:***    LABS:  BMP:    Lab Results   Component Value Date/Time    NA 136 10/13/2024 04:56 AM    NA 136 10/12/2024 09:12 AM    NA 136 10/10/2024 11:54 PM    K 3.7 10/13/2024 04:56 AM    K 3.4 10/12/2024 09:12 AM    K 4.0 10/10/2024 11:54 PM    CL 100 10/13/2024 04:56 AM    CL 96 10/12/2024 09:12 AM    CL 99 10/10/2024 11:54 PM    CO2 25 10/13/2024 04:56 AM    CO2 28 10/12/2024 09:12 AM    CO2 21 10/10/2024 11:54 PM    BUN 49 10/13/2024 04:56 AM    BUN 54 10/12/2024 09:12 AM    BUN 66 10/10/2024 11:54 PM    CREATININE 4.5 10/13/2024 04:56 AM    CREATININE 4.7 10/12/2024 09:12 AM    CREATININE 4.9 10/10/2024 11:54 PM    GLUCOSE 159 10/13/2024 04:56 AM    GLUCOSE 153 10/12/2024 09:12 AM    GLUCOSE 108 10/10/2024 11:54 PM         IMPRESSION:    There are no diagnoses linked to this encounter.      PLAN:    ***

## 2024-11-21 ENCOUNTER — Ambulatory Visit: Payer: MEDICARE | Attending: Family | Primary: Internal Medicine

## 2024-12-08 LAB — COMPREHENSIVE METABOLIC PANEL
Albumin: 3.8 g/dL
BUN: 15 mg/dL
Creatinine: 1.1 mg/dL
Est, Glom Filt Rate: 82
Glucose: 235 mg/dL
Potassium: 4.4 mmol/L
Sodium: 141 mmol/L

## 2024-12-12 ENCOUNTER — Telehealth: Admit: 2024-12-12 | Discharge: 2024-12-12 | Payer: MEDICARE | Attending: Nephrology | Primary: Internal Medicine

## 2024-12-12 DIAGNOSIS — N182 Chronic kidney disease, stage 2 (mild): Principal | ICD-10-CM
